# Patient Record
Sex: Female | Born: 1985 | Race: White | Hispanic: No | Smoking: Current every day smoker
Health system: Southern US, Community
[De-identification: ages and names within clinical notes are randomized; demographics above are authoritative.]

## PROBLEM LIST (undated history)

## (undated) ENCOUNTER — Inpatient Hospital Stay (HOSPITAL_COMMUNITY): Payer: Self-pay

## (undated) DIAGNOSIS — O139 Gestational [pregnancy-induced] hypertension without significant proteinuria, unspecified trimester: Secondary | ICD-10-CM

## (undated) DIAGNOSIS — N390 Urinary tract infection, site not specified: Secondary | ICD-10-CM

## (undated) DIAGNOSIS — F419 Anxiety disorder, unspecified: Secondary | ICD-10-CM

## (undated) DIAGNOSIS — Z8759 Personal history of other complications of pregnancy, childbirth and the puerperium: Secondary | ICD-10-CM

## (undated) HISTORY — PX: NOSE SURGERY: SHX723

## (undated) HISTORY — DX: Personal history of other complications of pregnancy, childbirth and the puerperium: Z87.59

## (undated) HISTORY — PX: WISDOM TOOTH EXTRACTION: SHX21

## (undated) HISTORY — PX: WRIST SURGERY: SHX841

## (undated) HISTORY — PX: PALATAL EXPANSION: SHX2147

## (undated) HISTORY — DX: Gestational (pregnancy-induced) hypertension without significant proteinuria, unspecified trimester: O13.9

## (undated) HISTORY — PX: KNEE ARTHROSCOPY: SUR90

---

## 2007-03-08 ENCOUNTER — Emergency Department (HOSPITAL_COMMUNITY): Admission: EM | Admit: 2007-03-08 | Discharge: 2007-03-09 | Payer: Self-pay | Admitting: Emergency Medicine

## 2008-07-16 ENCOUNTER — Emergency Department (HOSPITAL_BASED_OUTPATIENT_CLINIC_OR_DEPARTMENT_OTHER): Admission: EM | Admit: 2008-07-16 | Discharge: 2008-07-17 | Payer: Self-pay | Admitting: Emergency Medicine

## 2008-08-27 ENCOUNTER — Emergency Department (HOSPITAL_BASED_OUTPATIENT_CLINIC_OR_DEPARTMENT_OTHER): Admission: EM | Admit: 2008-08-27 | Discharge: 2008-08-27 | Payer: Self-pay | Admitting: Emergency Medicine

## 2009-01-08 ENCOUNTER — Emergency Department (HOSPITAL_BASED_OUTPATIENT_CLINIC_OR_DEPARTMENT_OTHER): Admission: EM | Admit: 2009-01-08 | Discharge: 2009-01-08 | Payer: Self-pay | Admitting: Emergency Medicine

## 2009-04-13 ENCOUNTER — Ambulatory Visit: Payer: Self-pay | Admitting: Diagnostic Radiology

## 2009-04-13 ENCOUNTER — Emergency Department (HOSPITAL_BASED_OUTPATIENT_CLINIC_OR_DEPARTMENT_OTHER): Admission: EM | Admit: 2009-04-13 | Discharge: 2009-04-13 | Payer: Self-pay | Admitting: Emergency Medicine

## 2009-10-07 ENCOUNTER — Emergency Department (HOSPITAL_BASED_OUTPATIENT_CLINIC_OR_DEPARTMENT_OTHER): Admission: EM | Admit: 2009-10-07 | Discharge: 2009-10-07 | Payer: Self-pay | Admitting: Emergency Medicine

## 2009-10-07 ENCOUNTER — Ambulatory Visit: Payer: Self-pay | Admitting: Diagnostic Radiology

## 2009-11-12 ENCOUNTER — Emergency Department (HOSPITAL_BASED_OUTPATIENT_CLINIC_OR_DEPARTMENT_OTHER): Admission: EM | Admit: 2009-11-12 | Discharge: 2009-11-12 | Payer: Self-pay | Admitting: Emergency Medicine

## 2010-03-19 ENCOUNTER — Emergency Department (HOSPITAL_BASED_OUTPATIENT_CLINIC_OR_DEPARTMENT_OTHER)
Admission: EM | Admit: 2010-03-19 | Discharge: 2010-03-19 | Payer: Self-pay | Source: Home / Self Care | Admitting: Emergency Medicine

## 2010-03-21 LAB — BASIC METABOLIC PANEL
BUN: 14 mg/dL (ref 6–23)
CO2: 25 mEq/L (ref 19–32)
Calcium: 9.9 mg/dL (ref 8.4–10.5)
Chloride: 105 mEq/L (ref 96–112)
Creatinine, Ser: 0.7 mg/dL (ref 0.4–1.2)
GFR calc Af Amer: >60 mL/min (ref >60)
GFR calc non Af Amer: >60 mL/min (ref >60)
Glucose, Bld: 89 mg/dL (ref 70–99)
Potassium: 4.1 mEq/L (ref 3.5–5.1)
Sodium: 144 mEq/L (ref 135–145)

## 2010-03-21 LAB — DIFFERENTIAL
Basophils Absolute: 0 10*3/uL (ref 0.0–0.1)
Basophils Relative: 0 % (ref 0–1)
Eosinophils Absolute: 0.3 10*3/uL (ref 0.0–0.7)
Eosinophils Relative: 3 % (ref 0–5)
Lymphocytes Relative: 29 % (ref 12–46)
Lymphs Abs: 3.3 10*3/uL (ref 0.7–4.0)
Monocytes Absolute: 0.7 10*3/uL (ref 0.1–1.0)
Monocytes Relative: 6 % (ref 3–12)
Neutro Abs: 6.9 10*3/uL (ref 1.7–7.7)
Neutrophils Relative %: 62 % (ref 43–77)

## 2010-03-21 LAB — URINALYSIS, ROUTINE W REFLEX MICROSCOPIC
Bilirubin Urine: NEGATIVE
Hgb urine dipstick: NEGATIVE
Ketones, ur: NEGATIVE mg/dL
Nitrite: NEGATIVE
Protein, ur: NEGATIVE mg/dL
Specific Gravity, Urine: 1.019 (ref 1.005–1.030)
Urine Glucose, Fasting: NEGATIVE mg/dL
Urobilinogen, UA: 1 mg/dL (ref 0.0–1.0)
pH: 6.5 (ref 5.0–8.0)

## 2010-03-21 LAB — PREGNANCY, URINE: Preg Test, Ur: NEGATIVE

## 2010-03-21 LAB — CBC
HCT: 42.6 % (ref 36.0–46.0)
Hemoglobin: 14.8 g/dL (ref 12.0–15.0)
MCH: 32 pg (ref 26.0–34.0)
MCHC: 34.7 g/dL (ref 30.0–36.0)
MCV: 92 fL (ref 78.0–100.0)
Platelets: 210 10*3/uL (ref 150–400)
RBC: 4.63 MIL/uL (ref 3.87–5.11)
RDW: 12.1 % (ref 11.5–15.5)
WBC: 11.2 10*3/uL — ABNORMAL HIGH (ref 4.0–10.5)

## 2010-05-18 LAB — CBC
HCT: 39.4 % (ref 36.0–46.0)
Hemoglobin: 13.8 g/dL (ref 12.0–15.0)
MCH: 33.7 pg (ref 26.0–34.0)
MCHC: 35 g/dL (ref 30.0–36.0)
MCV: 96.5 fL (ref 78.0–100.0)
Platelets: 213 10*3/uL (ref 150–400)
RBC: 4.08 MIL/uL (ref 3.87–5.11)
RDW: 11.7 % (ref 11.5–15.5)
WBC: 8.8 10*3/uL (ref 4.0–10.5)

## 2010-05-18 LAB — URINALYSIS, ROUTINE W REFLEX MICROSCOPIC
Bilirubin Urine: NEGATIVE
Glucose, UA: NEGATIVE mg/dL
Hgb urine dipstick: NEGATIVE
Ketones, ur: NEGATIVE mg/dL
Nitrite: NEGATIVE
Protein, ur: NEGATIVE mg/dL
Specific Gravity, Urine: 1.027 (ref 1.005–1.030)
Urobilinogen, UA: 1 mg/dL (ref 0.0–1.0)
pH: 8 (ref 5.0–8.0)

## 2010-05-18 LAB — COMPREHENSIVE METABOLIC PANEL
ALT: 12 U/L (ref 0–35)
AST: 20 U/L (ref 0–37)
Albumin: 4.3 g/dL (ref 3.5–5.2)
Alkaline Phosphatase: 68 U/L (ref 39–117)
BUN: 9 mg/dL (ref 6–23)
CO2: 26 mEq/L (ref 19–32)
Calcium: 9.2 mg/dL (ref 8.4–10.5)
Chloride: 104 mEq/L (ref 96–112)
Creatinine, Ser: 0.7 mg/dL (ref 0.4–1.2)
GFR calc Af Amer: >60 mL/min (ref >60)
GFR calc non Af Amer: >60 mL/min (ref >60)
Glucose, Bld: 84 mg/dL (ref 70–99)
Potassium: 3.3 mEq/L — ABNORMAL LOW (ref 3.5–5.1)
Sodium: 142 mEq/L (ref 135–145)
Total Bilirubin: 0.7 mg/dL (ref 0.3–1.2)
Total Protein: 7.4 g/dL (ref 6.0–8.3)

## 2010-05-18 LAB — DIFFERENTIAL
Basophils Absolute: 0.1 10*3/uL (ref 0.0–0.1)
Basophils Relative: 1 % (ref 0–1)
Eosinophils Absolute: 0.2 10*3/uL (ref 0.0–0.7)
Eosinophils Relative: 2 % (ref 0–5)
Lymphocytes Relative: 33 % (ref 12–46)
Lymphs Abs: 2.9 10*3/uL (ref 0.7–4.0)
Monocytes Absolute: 0.5 10*3/uL (ref 0.1–1.0)
Monocytes Relative: 5 % (ref 3–12)
Neutro Abs: 5.1 10*3/uL (ref 1.7–7.7)
Neutrophils Relative %: 60 % (ref 43–77)

## 2010-05-18 LAB — PREGNANCY, URINE: Preg Test, Ur: NEGATIVE

## 2010-05-18 LAB — LIPASE, BLOOD: Lipase: 72 U/L (ref 23–300)

## 2010-05-24 ENCOUNTER — Emergency Department (HOSPITAL_BASED_OUTPATIENT_CLINIC_OR_DEPARTMENT_OTHER)
Admission: EM | Admit: 2010-05-24 | Discharge: 2010-05-24 | Disposition: A | Discharge status: Home / Self Care | Attending: Emergency Medicine | Admitting: Emergency Medicine

## 2010-05-24 DIAGNOSIS — B9689 Other specified bacterial agents as the cause of diseases classified elsewhere: Secondary | ICD-10-CM | POA: Insufficient documentation

## 2010-05-24 DIAGNOSIS — F17200 Nicotine dependence, unspecified, uncomplicated: Secondary | ICD-10-CM | POA: Insufficient documentation

## 2010-05-24 DIAGNOSIS — N760 Acute vaginitis: Secondary | ICD-10-CM | POA: Insufficient documentation

## 2010-05-24 DIAGNOSIS — A499 Bacterial infection, unspecified: Secondary | ICD-10-CM | POA: Insufficient documentation

## 2010-05-24 DIAGNOSIS — B373 Candidiasis of vulva and vagina: Secondary | ICD-10-CM | POA: Insufficient documentation

## 2010-05-24 DIAGNOSIS — B3731 Acute candidiasis of vulva and vagina: Secondary | ICD-10-CM | POA: Insufficient documentation

## 2010-05-24 LAB — URINALYSIS, ROUTINE W REFLEX MICROSCOPIC
Bilirubin Urine: NEGATIVE
Glucose, UA: NEGATIVE mg/dL
Hgb urine dipstick: NEGATIVE
Ketones, ur: NEGATIVE mg/dL
Nitrite: NEGATIVE
Protein, ur: NEGATIVE mg/dL
Specific Gravity, Urine: 1.019 (ref 1.005–1.030)
Urobilinogen, UA: 0.2 mg/dL (ref 0.0–1.0)
pH: 6 (ref 5.0–8.0)

## 2010-05-24 LAB — WET PREP, GENITAL: Trich, Wet Prep: NONE SEEN

## 2010-05-24 LAB — PREGNANCY, URINE: Preg Test, Ur: NEGATIVE

## 2010-05-26 LAB — GC/CHLAMYDIA PROBE AMP, GENITAL
Chlamydia, DNA Probe: NEGATIVE
GC Probe Amp, Genital: NEGATIVE

## 2010-06-06 LAB — URINALYSIS, ROUTINE W REFLEX MICROSCOPIC
Bilirubin Urine: NEGATIVE
Glucose, UA: NEGATIVE mg/dL
Hgb urine dipstick: NEGATIVE
Ketones, ur: NEGATIVE mg/dL
Nitrite: NEGATIVE
Protein, ur: NEGATIVE mg/dL
Specific Gravity, Urine: 1.027 (ref 1.005–1.030)
Urobilinogen, UA: 1 mg/dL (ref 0.0–1.0)
pH: 6 (ref 5.0–8.0)

## 2010-06-06 LAB — WET PREP, GENITAL: Yeast Wet Prep HPF POC: NONE SEEN

## 2010-06-06 LAB — GC/CHLAMYDIA PROBE AMP, GENITAL
Chlamydia, DNA Probe: NEGATIVE
GC Probe Amp, Genital: NEGATIVE

## 2010-06-06 LAB — PREGNANCY, URINE: Preg Test, Ur: NEGATIVE

## 2010-06-13 ENCOUNTER — Emergency Department (HOSPITAL_BASED_OUTPATIENT_CLINIC_OR_DEPARTMENT_OTHER)
Admission: EM | Admit: 2010-06-13 | Discharge: 2010-06-14 | Disposition: A | Discharge status: Home / Self Care | Attending: Emergency Medicine | Admitting: Emergency Medicine

## 2010-06-13 DIAGNOSIS — S60229A Contusion of unspecified hand, initial encounter: Secondary | ICD-10-CM | POA: Insufficient documentation

## 2010-06-13 DIAGNOSIS — X58XXXA Exposure to other specified factors, initial encounter: Secondary | ICD-10-CM | POA: Insufficient documentation

## 2010-06-14 ENCOUNTER — Emergency Department (INDEPENDENT_AMBULATORY_CARE_PROVIDER_SITE_OTHER)

## 2010-06-14 DIAGNOSIS — M79609 Pain in unspecified limb: Secondary | ICD-10-CM

## 2010-06-14 DIAGNOSIS — R209 Unspecified disturbances of skin sensation: Secondary | ICD-10-CM

## 2010-06-14 DIAGNOSIS — IMO0002 Reserved for concepts with insufficient information to code with codable children: Secondary | ICD-10-CM

## 2010-07-20 ENCOUNTER — Emergency Department (INDEPENDENT_AMBULATORY_CARE_PROVIDER_SITE_OTHER)

## 2010-07-20 ENCOUNTER — Emergency Department (HOSPITAL_BASED_OUTPATIENT_CLINIC_OR_DEPARTMENT_OTHER)
Admission: EM | Admit: 2010-07-20 | Discharge: 2010-07-20 | Disposition: A | Discharge status: Home / Self Care | Attending: Emergency Medicine | Admitting: Emergency Medicine

## 2010-07-20 DIAGNOSIS — K625 Hemorrhage of anus and rectum: Secondary | ICD-10-CM | POA: Insufficient documentation

## 2010-07-20 DIAGNOSIS — R109 Unspecified abdominal pain: Secondary | ICD-10-CM

## 2010-07-20 DIAGNOSIS — K921 Melena: Secondary | ICD-10-CM

## 2010-07-20 LAB — DIFFERENTIAL
Basophils Relative: 0 % (ref 0–1)
Lymphs Abs: 1.6 10*3/uL (ref 0.7–4.0)
Monocytes Absolute: 0.7 10*3/uL (ref 0.1–1.0)
Monocytes Relative: 6 % (ref 3–12)
Neutro Abs: 9.1 10*3/uL — ABNORMAL HIGH (ref 1.7–7.7)
Neutrophils Relative %: 79 % — ABNORMAL HIGH (ref 43–77)

## 2010-07-20 LAB — URINE MICROSCOPIC-ADD ON

## 2010-07-20 LAB — CBC
Hemoglobin: 12.5 g/dL (ref 12.0–15.0)
MCH: 32.6 pg (ref 26.0–34.0)
MCHC: 34.7 g/dL (ref 30.0–36.0)
MCV: 93.8 fL (ref 78.0–100.0)
RBC: 3.84 MIL/uL — ABNORMAL LOW (ref 3.87–5.11)

## 2010-07-20 LAB — URINALYSIS, ROUTINE W REFLEX MICROSCOPIC
Glucose, UA: NEGATIVE mg/dL
Ketones, ur: 15 mg/dL — AB
Nitrite: NEGATIVE
Protein, ur: NEGATIVE mg/dL
pH: 5.5 (ref 5.0–8.0)

## 2010-10-16 ENCOUNTER — Encounter: Payer: Self-pay | Admitting: *Deleted

## 2010-10-16 ENCOUNTER — Emergency Department (HOSPITAL_BASED_OUTPATIENT_CLINIC_OR_DEPARTMENT_OTHER)
Admission: EM | Admit: 2010-10-16 | Discharge: 2010-10-16 | Disposition: A | Discharge status: Home / Self Care | Attending: Emergency Medicine | Admitting: Emergency Medicine

## 2010-10-16 DIAGNOSIS — S71009A Unspecified open wound, unspecified hip, initial encounter: Secondary | ICD-10-CM | POA: Insufficient documentation

## 2010-10-16 DIAGNOSIS — W540XXA Bitten by dog, initial encounter: Secondary | ICD-10-CM | POA: Insufficient documentation

## 2010-10-16 DIAGNOSIS — S71109A Unspecified open wound, unspecified thigh, initial encounter: Secondary | ICD-10-CM | POA: Insufficient documentation

## 2010-10-16 DIAGNOSIS — Y92009 Unspecified place in unspecified non-institutional (private) residence as the place of occurrence of the external cause: Secondary | ICD-10-CM | POA: Insufficient documentation

## 2010-10-16 DIAGNOSIS — F17200 Nicotine dependence, unspecified, uncomplicated: Secondary | ICD-10-CM | POA: Insufficient documentation

## 2010-10-16 MED ORDER — OXYCODONE-ACETAMINOPHEN 5-325 MG PO TABS
1.0000 | ORAL_TABLET | Freq: Once | ORAL | Status: AC
  Administered 2010-10-16: 1 via ORAL
  Filled 2010-10-16: qty 1

## 2010-10-16 MED ORDER — OXYCODONE-ACETAMINOPHEN 5-325 MG PO TABS: 2.0000 | ORAL_TABLET | ORAL | Status: AC | PRN

## 2010-10-16 NOTE — ED Notes (Signed)
Pt c/o dog bite to right thigh x 1 hr ago

## 2010-10-16 NOTE — ED Notes (Signed)
rx x 1 for percocet given- wound care discussed and pt verbalized understanding and s/s of infection- pt has a ride

## 2010-10-16 NOTE — ED Provider Notes (Addendum)
History     CSN: [id number] Arrival date & time: 10/16/2010  6:10 PM  Chief Complaint  Patient presents with  . Animal Bite   Patient is a 25 y.o. female presenting with animal bite. The history is provided by the patient.  Animal Bite  The incident occurred just prior to arrival. There is an injury to the right thigh. Pertinent negatives include no numbness.   the patient was bitten in her right thigh by the neighbors [place] shepherd. She did have jeans on at the time however the skin was broken on her right upper thigh. The patient's tetanus booster is up to date she complains of pain but did not believe there was any foreign body in the soft tissues. The patient's tetanus booster is up to date she has no numbness weakness or tingling.  History reviewed. No pertinent past medical history.  Past Surgical History  Procedure Date  . Joint replacement   . Palatal expansion     History reviewed. No pertinent family history.  History  Substance Use Topics  . Smoking status: Current Everyday Smoker -- 1.0 packs/day  . Smokeless tobacco: Not on file  . Alcohol Use: No    OB History    Grav Para Term Preterm Abortions TAB SAB Ect Mult Living                  Review of Systems  Musculoskeletal: Negative for joint swelling.  Neurological: Negative for numbness.  Hematological: Does not bruise/bleed easily.  All other systems reviewed and are negative.    Physical Exam  BP 127/65  Pulse 86  Temp(Src) 98.3 F (36.8 C) (Oral)  Resp 16  Wt 112 lb (50.803 kg)  SpO2 100%  LMP 09/22/2010  Physical Exam  Constitutional: She appears well-developed and well-nourished.  HENT:  Head: Normocephalic.  Eyes: Pupils are equal, round, and reactive to light.  Cardiovascular: Normal heart sounds.   Pulmonary/Chest: Breath sounds normal.  Abdominal: Soft.  Musculoskeletal: Normal range of motion. She exhibits edema and tenderness.  Neurological: She is alert.  Skin: Skin is warm  and dry.       2 separate bite marks to right upper thigh there is some mild surrounding ecchymoses. No obvious retained foreign body is appreciated mild tenderness and erythema. No active bleeding.    ED Course  Procedures  MDM Pt is seen and examined;  Initial history and physical completed.  Will follow.  nt Wound care is provided by nursing staff with copious irrigation and cleansing with sterile 4 x 4 no foreign body is noted patient is given pain medication and will be instructed to return the ED in 48 hours for a wound check no antibiotics indicated at this point in time.    Parley Pidcock A. Patrica Duel, MD 10/16/10 1824  Lorelle Gibbs. Patrica Duel, MD 10/16/10 [id number]

## 2014-05-08 DIAGNOSIS — M228X1 Other disorders of patella, right knee: Secondary | ICD-10-CM

## 2014-05-08 HISTORY — DX: Other disorders of patella, right knee: M22.8X1

## 2016-02-19 ENCOUNTER — Emergency Department (HOSPITAL_BASED_OUTPATIENT_CLINIC_OR_DEPARTMENT_OTHER)
Admission: EM | Admit: 2016-02-19 | Discharge: 2016-02-19 | Disposition: A | Discharge status: Home / Self Care | Payer: No Typology Code available for payment source | Attending: Emergency Medicine | Admitting: Emergency Medicine

## 2016-02-19 ENCOUNTER — Encounter (HOSPITAL_BASED_OUTPATIENT_CLINIC_OR_DEPARTMENT_OTHER): Payer: Self-pay | Admitting: *Deleted

## 2016-02-19 DIAGNOSIS — S161XXA Strain of muscle, fascia and tendon at neck level, initial encounter: Secondary | ICD-10-CM | POA: Insufficient documentation

## 2016-02-19 DIAGNOSIS — Y9389 Activity, other specified: Secondary | ICD-10-CM | POA: Insufficient documentation

## 2016-02-19 DIAGNOSIS — F17200 Nicotine dependence, unspecified, uncomplicated: Secondary | ICD-10-CM | POA: Insufficient documentation

## 2016-02-19 DIAGNOSIS — Y999 Unspecified external cause status: Secondary | ICD-10-CM | POA: Diagnosis not present

## 2016-02-19 DIAGNOSIS — S199XXA Unspecified injury of neck, initial encounter: Secondary | ICD-10-CM | POA: Diagnosis present

## 2016-02-19 DIAGNOSIS — Y92410 Unspecified street and highway as the place of occurrence of the external cause: Secondary | ICD-10-CM | POA: Diagnosis not present

## 2016-02-19 HISTORY — DX: Anxiety disorder, unspecified: F41.9

## 2016-02-19 MED ORDER — METHOCARBAMOL 500 MG PO TABS
750.0000 mg | ORAL_TABLET | Freq: Once | ORAL | Status: AC
  Administered 2016-02-19: 750 mg via ORAL
  Filled 2016-02-19: qty 2

## 2016-02-19 MED ORDER — NAPROXEN 500 MG PO TABS: 500.0000 mg | ORAL_TABLET | Freq: Two times a day (BID) | ORAL | 0 refills | Status: DC

## 2016-02-19 MED ORDER — IBUPROFEN 400 MG PO TABS
600.0000 mg | ORAL_TABLET | Freq: Once | ORAL | Status: AC
  Administered 2016-02-19: 600 mg via ORAL
  Filled 2016-02-19: qty 1

## 2016-02-19 MED ORDER — METHOCARBAMOL 500 MG PO TABS: 500.0000 mg | ORAL_TABLET | Freq: Two times a day (BID) | ORAL | 0 refills | Status: DC
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
## 2016-02-19 NOTE — Discharge Instructions (Signed)
Take naproxen for pain. Robaxin for muscle spasms. Use ice and heat. Follow-up with primary care doctor as needed.

## 2016-09-17 ENCOUNTER — Emergency Department (HOSPITAL_BASED_OUTPATIENT_CLINIC_OR_DEPARTMENT_OTHER)
Admission: EM | Admit: 2016-09-17 | Discharge: 2016-09-17 | Disposition: A | Discharge status: Home / Self Care | Payer: BLUE CROSS/BLUE SHIELD | Attending: Emergency Medicine | Admitting: Emergency Medicine

## 2016-09-17 ENCOUNTER — Encounter (HOSPITAL_BASED_OUTPATIENT_CLINIC_OR_DEPARTMENT_OTHER): Payer: Self-pay | Admitting: Emergency Medicine

## 2016-09-17 DIAGNOSIS — Z79899 Other long term (current) drug therapy: Secondary | ICD-10-CM | POA: Insufficient documentation

## 2016-09-17 DIAGNOSIS — N760 Acute vaginitis: Secondary | ICD-10-CM | POA: Insufficient documentation

## 2016-09-17 DIAGNOSIS — F17210 Nicotine dependence, cigarettes, uncomplicated: Secondary | ICD-10-CM | POA: Diagnosis not present

## 2016-09-17 DIAGNOSIS — B9689 Other specified bacterial agents as the cause of diseases classified elsewhere: Secondary | ICD-10-CM | POA: Diagnosis not present

## 2016-09-17 DIAGNOSIS — R300 Dysuria: Secondary | ICD-10-CM | POA: Diagnosis present

## 2016-09-17 HISTORY — DX: Urinary tract infection, site not specified: N39.0

## 2016-09-17 LAB — URINALYSIS, MICROSCOPIC (REFLEX)

## 2016-09-17 LAB — URINALYSIS, ROUTINE W REFLEX MICROSCOPIC
GLUCOSE, UA: NEGATIVE mg/dL
HGB URINE DIPSTICK: NEGATIVE
Ketones, ur: 15 mg/dL — AB
Nitrite: POSITIVE — AB
PH: 7.5 (ref 5.0–8.0)
Protein, ur: NEGATIVE mg/dL
SPECIFIC GRAVITY, URINE: 1.021 (ref 1.005–1.030)

## 2016-09-17 LAB — PREGNANCY, URINE: Preg Test, Ur: NEGATIVE

## 2016-09-17 LAB — WET PREP, GENITAL
Sperm: NONE SEEN
TRICH WET PREP: NONE SEEN
YEAST WET PREP: NONE SEEN

## 2016-09-17 MED ORDER — METRONIDAZOLE 0.75 % VA GEL: 1.0000 | Freq: Two times a day (BID) | VAGINAL | 0 refills | Status: AC

## 2016-09-17 MED ORDER — CEPHALEXIN 500 MG PO CAPS: 500.0000 mg | ORAL_CAPSULE | Freq: Two times a day (BID) | ORAL | 0 refills | Status: DC

## 2016-09-17 NOTE — ED Triage Notes (Signed)
Pain to suprapubic area and also to urethra x2 days.  Diagnosed with kidney stone last week.  Took Macrobid and Pyridium. The pain is worse now. Pt also sts she may have shaved too close and has some bumps to her perineal area.

## 2016-09-17 NOTE — ED Notes (Signed)
Pt. Was recently diagnosed with a kidney stone.

## 2016-09-17 NOTE — Discharge Instructions (Signed)
It is important that you keep your appointment with your urologist next week.  Use antibiotics as prescribed. Use MetroGel twice a day for 5 days. Continue to use Pyridium. Continue to stay well-hydrated, this is very important. Avoid irritating agent such as caffeine, smoking, or chocolate. Return to the emergency department if you develop fever, chills, vomiting, or any new or worsening symptoms.

## 2016-09-17 NOTE — ED Provider Notes (Signed)
[facility]   CSN: [id number] Arrival date & time: 09/17/16  1610  By signing my name below, I, Katherine Barber, attest that this documentation has been prepared under the direction and in the presence of Katherine Joubert, PA-C. Electronically Signed: Linna Barber, Scribe. 09/17/2016. 5:11 PM.  History   Chief Complaint Chief Complaint  Patient presents with  . Dysuria   The history is provided by the patient. No language interpreter was used.    HPI Comments: Katherine Barber is a 31 y.o. female with a PMHx of recurrent UTI's who presents to the Emergency Department complaining of persistent, worsening dysuria for three days. She reports some associated suprapubic pain that is worse when she urinates. She has recently noticed blood on the toilet tissue upon wiping after urination but denies seeing any blood in the toilet bowl. Patient states it feels like "fiery hot razor blades" in her urethra when she urinates. She also notes that it feels like something is stuck in her urethra. Patient was diagnosed with a 3 mm non-obstructing stone in the right kidney on 08/30/16. She was subsequently prescribed Pyridium and Macrobid and completed them as instructed. She has not seen the stone pass and has no prior h/o kidney stones. She denies flank pain. She is sexually active without protection and recently started having intercourse with a new partner. She denies fevers, chills, nausea, vomiting, pain in her upper abdomen, urinary frequency/urgency, or any other associated symptoms. Patient has an appointment with urology on 7/23.  Past Medical History:  Diagnosis Date  . Anxiety   . UTI (urinary tract infection)     There are no active problems to display for this patient.   Past Surgical History:  Procedure Laterality Date  . KNEE ARTHROSCOPY    . NOSE SURGERY    . PALATAL EXPANSION    . WRIST SURGERY      OB History    No data available       Home  Medications    Prior to Admission medications   Medication Sig Start Date End Date Taking? Authorizing Provider  escitalopram (LEXAPRO) 10 MG tablet Take 10 mg by mouth daily.   Yes [provider]  cephALEXin (KEFLEX) 500 MG capsule Take 1 capsule (500 mg total) by mouth 2 (two) times daily. 09/17/16   Rumaysa Sabatino, PA-C  Cyanocobalamin (B-12) 500 MCG SUBL Place 1,500 mcg under the tongue daily.      [provider]  methocarbamol (ROBAXIN) 500 MG tablet Take 1 tablet (500 mg total) by mouth 2 (two) times daily. 02/19/16   Kirichenko, Tatyana, PA-C  metroNIDAZOLE (METROGEL VAGINAL) 0.75 % vaginal gel Place 1 Applicatorful vaginally 2 (two) times daily. 09/17/16 09/22/16  Adler Alton, PA-C  naproxen (NAPROSYN) 500 MG tablet Take 1 tablet (500 mg total) by mouth 2 (two) times daily. 02/19/16   Kirichenko, Lemont Fillers, PA-C  Norgestimate-Ethinyl Estradiol Triphasic (TRI-SPRINTEC) 0.18/0.215/0.25 MG-35 MCG tablet Take 1 tablet by mouth daily.      [provider]    Family History No family history on file.  Social History Social History  Substance Use Topics  . Smoking status: Current Every Day Smoker    Packs/day: 1.00  . Smokeless tobacco: Never Used  . Alcohol use Yes     Comment: socially     Allergies   Patient has no known allergies.   Review of Systems Review of Systems  Constitutional: Negative for chills and fever.  Gastrointestinal: Positive for abdominal  pain. Negative for nausea and vomiting.  Genitourinary: Positive for dysuria. Negative for frequency and urgency.  All other systems reviewed and are negative.  Physical Exam Updated Vital Signs BP 123/87 (BP Location: Right Arm)   Pulse 98   Temp 98.7 F (37.1 C) (Oral)   Resp 16   Ht 5\' 3"  (1.6 m)   Wt 56.2 kg (124 lb)   LMP 09/14/2016 (Exact Date)   SpO2 97%   BMI 21.97 kg/m   Physical Exam  Constitutional: She is oriented to person, place, and time. She appears  well-developed and well-nourished. No distress.  HENT:  Head: Normocephalic and atraumatic.  Eyes: Conjunctivae and EOM are normal.  Neck: Neck supple. No tracheal deviation present.  Cardiovascular: Normal rate, regular rhythm and intact distal pulses.   Pulmonary/Chest: Effort normal and breath sounds normal. No respiratory distress.  Abdominal: Soft. Bowel sounds are normal. She exhibits no distension. There is no tenderness. There is no guarding.  Genitourinary: Rectum normal and uterus normal.    Pelvic exam was performed with patient supine. There is lesion on the right labia. Cervix exhibits no motion tenderness, no discharge and no friability. Right adnexum displays no mass, no tenderness and no fullness. Left adnexum displays no mass, no tenderness and no fullness. There is tenderness in the vagina.  Genitourinary Comments: Two discrete lesions on the right outer labia. Lesions appear to be previously drained pustules without active drainage or surrounding signs of cellulitis. Each lesion is 2 mm in diameter. Pt with tenderness and irritation of the posterior vaginal opening. No obvious bleeding or injury.  No obvious redness or irritation at the urethral opening. Patient with blood from the cervix consistent with menstruation.  Musculoskeletal: Normal range of motion.  Neurological: She is alert and oriented to person, place, and time.  Skin: Skin is warm and dry.  Psychiatric: She has a normal mood and affect. Her behavior is normal.  Nursing note and vitals reviewed.  ED Treatments / Results  Labs (all labs ordered are listed, but only abnormal results are displayed) Labs Reviewed  WET PREP, GENITAL - Abnormal; Notable for the following:       Result Value   Clue Cells Wet Prep HPF POC PRESENT (*)    WBC, Wet Prep HPF POC FEW (*)    All other components within normal limits  URINALYSIS, ROUTINE W REFLEX MICROSCOPIC - Abnormal; Notable for the following:    Color, Urine  ORANGE (*)    Bilirubin Urine SMALL (*)    Ketones, ur 15 (*)    Nitrite POSITIVE (*)    Leukocytes, UA SMALL (*)    All other components within normal limits  URINALYSIS, MICROSCOPIC (REFLEX) - Abnormal; Notable for the following:    Bacteria, UA FEW (*)    Squamous Epithelial / LPF 0-5 (*)    All other components within normal limits  URINE CULTURE  PREGNANCY, URINE  HIV ANTIBODY (ROUTINE TESTING)  RPR  GC/CHLAMYDIA PROBE AMP ([place]) NOT AT [facility]    EKG  EKG Interpretation None       Radiology No results found.  Procedures Procedures (including critical care time)  DIAGNOSTIC STUDIES: Oxygen Saturation is 97% on RA, normal by my interpretation.    COORDINATION OF CARE: 4:56 PM Discussed treatment plan with pt at bedside and pt agreed to plan.  Medications Ordered in ED Medications - No data to display   Initial Impression / Assessment and Plan / ED Course  I have  reviewed the triage vital signs and the nursing notes.  Pertinent labs & imaging results that were available during my care of the patient were reviewed by me and considered in my medical decision making (see chart for details).     Patient presenting with worsening dysuria and suprapubic pain. She denies fever, chills, nausea, vomiting, or flank pain. Physical exam showed no tenderness to palpation of the abdomen. GU exam reassuring. Patient with some vaginal irritation, which she states might be due to tampon use. Wet prep showed clue cells, and UA with positive nitrates and leukocytes. No flank pain or colicky pain indicating that this may be a kidney stone. Will order UA, wet prep, and testing for STDs. Will treat patient for UTI and BV. Discussed case with attending, and Dr. Criss AlvineGoldston evaluated the patient. Discussed findings with patient. Patient to follow-up with urologist as scheduled. Return precautions given. Patient states she understands and agrees to plan.  Final Clinical Impressions(s) /  ED Diagnoses   Final diagnoses:  Dysuria  BV (bacterial vaginosis)    New Prescriptions Discharge Medication List as of 09/17/2016  6:14 PM    START taking these medications   Details  cephALEXin (KEFLEX) 500 MG capsule Take 1 capsule (500 mg total) by mouth 2 (two) times daily., Starting Tue 09/17/2016, Print    metroNIDAZOLE (METROGEL VAGINAL) 0.75 % vaginal gel Place 1 Applicatorful vaginally 2 (two) times daily., Starting Tue 09/17/2016, Until Sun 09/22/2016, Print       I personally performed the services described in this documentation, which was scribed in my presence. The recorded information has been reviewed and is accurate.    Alveria ApleyCaccavale, Masiel Gentzler, PA-C 09/18/16 [id number]    Pricilla LovelessGoldston, Scott, MD 09/18/16 1524

## 2016-09-18 LAB — HIV ANTIBODY (ROUTINE TESTING W REFLEX): HIV SCREEN 4TH GENERATION: NONREACTIVE

## 2016-09-18 LAB — URINE CULTURE

## 2016-09-18 LAB — GC/CHLAMYDIA PROBE AMP (~~LOC~~) NOT AT ARMC
Chlamydia: NEGATIVE
Neisseria Gonorrhea: NEGATIVE

## 2016-09-18 LAB — RPR: RPR Ser Ql: NONREACTIVE

## 2017-03-05 ENCOUNTER — Emergency Department (HOSPITAL_BASED_OUTPATIENT_CLINIC_OR_DEPARTMENT_OTHER): Payer: Self-pay

## 2017-03-05 ENCOUNTER — Other Ambulatory Visit: Payer: Self-pay

## 2017-03-05 ENCOUNTER — Encounter (HOSPITAL_BASED_OUTPATIENT_CLINIC_OR_DEPARTMENT_OTHER): Payer: Self-pay | Admitting: Emergency Medicine

## 2017-03-05 ENCOUNTER — Emergency Department (HOSPITAL_BASED_OUTPATIENT_CLINIC_OR_DEPARTMENT_OTHER)
Admission: EM | Admit: 2017-03-05 | Discharge: 2017-03-05 | Disposition: A | Discharge status: Home / Self Care | Payer: Self-pay | Attending: Emergency Medicine | Admitting: Emergency Medicine

## 2017-03-05 DIAGNOSIS — B9789 Other viral agents as the cause of diseases classified elsewhere: Secondary | ICD-10-CM | POA: Insufficient documentation

## 2017-03-05 DIAGNOSIS — F17210 Nicotine dependence, cigarettes, uncomplicated: Secondary | ICD-10-CM | POA: Insufficient documentation

## 2017-03-05 DIAGNOSIS — J069 Acute upper respiratory infection, unspecified: Secondary | ICD-10-CM | POA: Insufficient documentation

## 2017-03-05 DIAGNOSIS — Z79899 Other long term (current) drug therapy: Secondary | ICD-10-CM | POA: Insufficient documentation

## 2017-03-05 LAB — URINALYSIS, ROUTINE W REFLEX MICROSCOPIC
Bilirubin Urine: NEGATIVE
GLUCOSE, UA: NEGATIVE mg/dL
Hgb urine dipstick: NEGATIVE
KETONES UR: 15 mg/dL — AB
LEUKOCYTES UA: NEGATIVE
NITRITE: NEGATIVE
PH: 6.5 (ref 5.0–8.0)
Protein, ur: NEGATIVE mg/dL
SPECIFIC GRAVITY, URINE: 1.025 (ref 1.005–1.030)

## 2017-03-05 MED ORDER — BENZONATATE 100 MG PO CAPS: 100.0000 mg | ORAL_CAPSULE | Freq: Three times a day (TID) | ORAL | 0 refills | Status: DC | PRN

## 2017-03-05 MED FILL — BENZONATATE 100 MG CAPSULE: 100 | 7 days supply | Qty: 21 | Fill #0

## 2017-03-05 NOTE — ED Triage Notes (Signed)
Patient states that she has had a cough and cold like symptoms x 2 -3 days _ the patient reports that she has been around people with the flu

## 2017-03-05 NOTE — ED Notes (Signed)
Pt endorses fever that started on Monday TMax of 101. Bodyaches, chills, nasal congestion, persistent cough that pt states makes her gag. Pt also endorses mild periods of ShOB.

## 2017-03-05 NOTE — Discharge Instructions (Signed)
Please read instructions below. You can continue taking advil as needed for sore throat or body aches. Drink plenty of water. Take the tessalon every 8 hours as needed for cough. Follow up with your primary care provider as needed. Return to the ER for difficulty swallowing liquids, difficulty breathing, or new or worsening symptoms.

## 2017-03-05 NOTE — ED Provider Notes (Signed)
[facility] EMERGENCY DEPARTMENT Provider Note   CSN: [id number] Arrival date & time: 03/05/17  1456     History   Chief Complaint Chief Complaint  Patient presents with  . Cough    HPI Katherine Barber is a 32 y.o. female with past medical history of anxiety, presenting to the ED with 3 days of body aches, nasal congestion, and cough.  Patient reports symptoms began Monday morning with body aches, then began having nasal congestion and a dry cough.  Been treating her symptoms with ibuprofen, DayQuil and NyQuil.  She states these medications have been helping her symptoms other than the cough.  Reports 1 day of fever less than 101 F.  Reports positive sick contacts including her stepson and her fianc who were diagnosed with influenza A last week.  She denies headache, difficulty breathing or swallowing, nausea, or any other complaints.  The history is provided by the patient.    Past Medical History:  Diagnosis Date  . Anxiety   . UTI (urinary tract infection)     There are no active problems to display for this patient.   Past Surgical History:  Procedure Laterality Date  . KNEE ARTHROSCOPY    . NOSE SURGERY    . PALATAL EXPANSION    . WRIST SURGERY      OB History    No data available       Home Medications    Prior to Admission medications   Medication Sig Start Date End Date Taking? Authorizing Provider  benzonatate (TESSALON) 100 MG capsule Take 1 capsule (100 mg total) by mouth 3 (three) times daily as needed for cough. 03/05/17   Detrick Dani, [place] N, PA-C  cephALEXin (KEFLEX) 500 MG capsule Take 1 capsule (500 mg total) by mouth 2 (two) times daily. 09/17/16   Caccavale, Sophia, PA-C  Cyanocobalamin (B-12) 500 MCG SUBL Place 1,500 mcg under the tongue daily.      [provider]  escitalopram (LEXAPRO) 10 MG tablet Take 10 mg by mouth daily.    [provider]  methocarbamol (ROBAXIN) 500 MG tablet Take 1 tablet (500 mg total) by  mouth 2 (two) times daily. 02/19/16   Kirichenko, Tatyana, PA-C  naproxen (NAPROSYN) 500 MG tablet Take 1 tablet (500 mg total) by mouth 2 (two) times daily. 02/19/16   Kirichenko, Lemont Fillers, PA-C  Norgestimate-Ethinyl Estradiol Triphasic (TRI-SPRINTEC) 0.18/0.215/0.25 MG-35 MCG tablet Take 1 tablet by mouth daily.      [provider]    Family History History reviewed. No pertinent family history.  Social History Social History   Tobacco Use  . Smoking status: Current Every Day Smoker    Packs/day: 1.00  . Smokeless tobacco: Never Used  Substance Use Topics  . Alcohol use: Yes    Comment: socially  . Drug use: No     Allergies   Patient has no known allergies.   Review of Systems Review of Systems  Constitutional: Positive for fever.  HENT: Positive for congestion and sore throat. Negative for ear pain, trouble swallowing and voice change.   Respiratory: Positive for cough. Negative for shortness of breath.   Gastrointestinal: Negative for abdominal pain and nausea.  All other systems reviewed and are negative.    Physical Exam Updated Vital Signs BP 133/80 (BP Location: Left Arm)   Pulse 92   Temp 98.1 F (36.7 C) (Oral)   Resp 18   Ht 5\' 3"  (1.6 m)   Wt 56.2 kg (124 lb)  LMP 03/04/2017   SpO2 100%   BMI 21.97 kg/m   Physical Exam  Constitutional: She appears well-developed and well-nourished. No distress.  Tolerating secretions.  HENT:  Head: Normocephalic and atraumatic.  Pharynx mildly erythematous, without edema or exudates.  Uvula midline, no trismus.  Eyes: Conjunctivae are normal.  Neck: Normal range of motion. Neck supple.  Cardiovascular: Normal rate, regular rhythm, normal heart sounds and intact distal pulses.  Pulmonary/Chest: Effort normal and breath sounds normal. No stridor. No respiratory distress. She has no wheezes. She has no rales.  Lymphadenopathy:    She has no cervical adenopathy.  Skin: Skin is warm. No rash noted.    Psychiatric: She has a normal mood and affect. Her behavior is normal.  Nursing note and vitals reviewed.    ED Treatments / Results  Labs (all labs ordered are listed, but only abnormal results are displayed) Labs Reviewed  URINALYSIS, ROUTINE W REFLEX MICROSCOPIC    EKG  EKG Interpretation None       Radiology Dg Chest 2 View  Result Date: 03/05/2017 CLINICAL DATA:  32 year old female with shortness breath for 2 days. Smoker. Initial encounter. EXAM: CHEST  2 VIEW COMPARISON:  04/13/2009. FINDINGS: No infiltrate, congestive heart failure or pneumothorax. No plain film evidence of pulmonary malignancy. Heart size within normal limits. No acute bony abnormality. IMPRESSION: No active cardiopulmonary disease. Electronically Signed   By: Lacy DuverneySteven  Olson M.D.   On: 03/05/2017 15:51    Procedures Procedures (including critical care time)  Medications Ordered in ED Medications - No data to display   Initial Impression / Assessment and Plan / ED Course  I have reviewed the triage vital signs and the nursing notes.  Pertinent labs & imaging results that were available during my care of the patient were reviewed by me and considered in my medical decision making (see chart for details).     Patients symptoms are consistent with URI, likely viral etiology, possibly influenza. Tolerating secretions, afebrile, not in distress. Lungs clear. Pt CXR negative for acute infiltrate. Discussed possibility of influenza and discussed that tamiflu would not provide much benefit given onset of symptoms.  Discussed that antibiotics are not indicated for viral infections. Pt will be discharged with symptomatic treatment.  Verbalizes understanding and is agreeable with plan. Pt is hemodynamically stable & in NAD prior to dc.  Discussed results, findings, treatment and follow up. Patient advised of return precautions. Patient verbalized understanding and agreed with plan.  Final Clinical  Impressions(s) / ED Diagnoses   Final diagnoses:  Viral URI with cough    ED Discharge Orders        Ordered    benzonatate (TESSALON) 100 MG capsule  3 times daily PRN     03/05/17 1631       Thoren Hosang, [place] N, PA-C 03/05/17 1641    Little, Ambrose Finlandachel Morgan, MD 03/07/17 1451

## 2017-05-02 ENCOUNTER — Other Ambulatory Visit: Payer: Self-pay

## 2017-05-02 ENCOUNTER — Emergency Department (HOSPITAL_BASED_OUTPATIENT_CLINIC_OR_DEPARTMENT_OTHER)
Admission: EM | Admit: 2017-05-02 | Discharge: 2017-05-02 | Disposition: A | Discharge status: Home / Self Care | Attending: Emergency Medicine | Admitting: Emergency Medicine

## 2017-05-02 ENCOUNTER — Encounter (HOSPITAL_BASED_OUTPATIENT_CLINIC_OR_DEPARTMENT_OTHER): Payer: Self-pay | Admitting: Emergency Medicine

## 2017-05-02 DIAGNOSIS — S0181XA Laceration without foreign body of other part of head, initial encounter: Secondary | ICD-10-CM | POA: Insufficient documentation

## 2017-05-02 DIAGNOSIS — Z79899 Other long term (current) drug therapy: Secondary | ICD-10-CM | POA: Insufficient documentation

## 2017-05-02 DIAGNOSIS — Y929 Unspecified place or not applicable: Secondary | ICD-10-CM | POA: Insufficient documentation

## 2017-05-02 DIAGNOSIS — Y998 Other external cause status: Secondary | ICD-10-CM | POA: Insufficient documentation

## 2017-05-02 DIAGNOSIS — F17210 Nicotine dependence, cigarettes, uncomplicated: Secondary | ICD-10-CM | POA: Insufficient documentation

## 2017-05-02 DIAGNOSIS — Y9389 Activity, other specified: Secondary | ICD-10-CM | POA: Insufficient documentation

## 2017-05-02 MED ORDER — LIDOCAINE-EPINEPHRINE-TETRACAINE (LET) SOLUTION
3.0000 mL | Freq: Once | NASAL | Status: AC
  Administered 2017-05-02: 3 mL via TOPICAL
  Filled 2017-05-02: qty 3

## 2017-05-02 MED ORDER — HYDROCODONE-ACETAMINOPHEN 5-325 MG PO TABS: ORAL_TABLET | ORAL | 0 refills | Status: DC

## 2017-05-02 MED ORDER — BACITRACIN ZINC 500 UNIT/GM EX OINT: 1.0000 "application " | TOPICAL_OINTMENT | Freq: Once | CUTANEOUS | Status: DC

## 2017-05-02 MED ORDER — LIDOCAINE HCL (PF) 1 % IJ SOLN
5.0000 mL | Freq: Once | INTRAMUSCULAR | Status: AC
  Administered 2017-05-02: 5 mL via INTRADERMAL
  Filled 2017-05-02: qty 5

## 2017-05-02 NOTE — Discharge Instructions (Signed)
Keep wound dry and do not remove dressing for 24 hours if possible. After that, wash gently morning and night (every 12 hours) with soap and water. Use a topical antibiotic ointment and cover with a bandaid or gauze.  °  °Do NOT use rubbing alcohol or hydrogen peroxide, do not soak the area °  °Present to your primary care doctor or the urgent care of your choice, or the ED for suture removal in 5-7 days. °  °Every attempt was made to remove foreign body (contaminants) from the wound.  However, there is always a chance that some may remain in the wound. This can  increase your risk of infection. °  °If you see signs of infection (warmth, redness, tenderness, pus, sharp increase in pain, fever, red streaking in the skin) immediately return to the emergency department. °  °After the wound heals fully, apply sunscreen for 6-12 months to minimize scarring.  ° ° °

## 2017-05-02 NOTE — ED Notes (Signed)
Laceration cleaned and I&D kit at bedside.
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
## 2017-05-09 ENCOUNTER — Other Ambulatory Visit: Payer: Self-pay

## 2017-05-09 ENCOUNTER — Emergency Department (HOSPITAL_BASED_OUTPATIENT_CLINIC_OR_DEPARTMENT_OTHER)
Admission: EM | Admit: 2017-05-09 | Discharge: 2017-05-09 | Disposition: A | Discharge status: Home / Self Care | Attending: Emergency Medicine | Admitting: Emergency Medicine

## 2017-05-09 ENCOUNTER — Encounter (HOSPITAL_BASED_OUTPATIENT_CLINIC_OR_DEPARTMENT_OTHER): Payer: Self-pay | Admitting: *Deleted

## 2017-05-09 DIAGNOSIS — Z4802 Encounter for removal of sutures: Secondary | ICD-10-CM | POA: Insufficient documentation

## 2017-05-09 DIAGNOSIS — Z79899 Other long term (current) drug therapy: Secondary | ICD-10-CM | POA: Insufficient documentation

## 2017-05-09 DIAGNOSIS — F17210 Nicotine dependence, cigarettes, uncomplicated: Secondary | ICD-10-CM | POA: Insufficient documentation

## 2017-05-09 NOTE — ED Provider Notes (Signed)
[facility] EMERGENCY DEPARTMENT Provider Note   CSN: [id number] Arrival date & time: 05/09/17  1420     History   Chief Complaint Chief Complaint  Patient presents with  . Suture / Staple Removal    HPI Katherine Barber is a 33 y.o. female who presents the emergency department today for suture removal.  Patient had 3, 7-0 sutures placed on the left temporal area 7 days ago.  She notes that this is been healing well.  There is been no overlying pain, erythema, swelling, or drainage.  She has not required any medications for this.  She denies any fevers at home.  Her tetanus was up-to-date during her last visit.   HPI  Past Medical History:  Diagnosis Date  . Anxiety   . UTI (urinary tract infection)     There are no active problems to display for this patient.   Past Surgical History:  Procedure Laterality Date  . KNEE ARTHROSCOPY    . NOSE SURGERY    . PALATAL EXPANSION    . WRIST SURGERY      OB History    No data available       Home Medications    Prior to Admission medications   Medication Sig Start Date End Date Taking? Authorizing Provider  benzonatate (TESSALON) 100 MG capsule Take 1 capsule (100 mg total) by mouth 3 (three) times daily as needed for cough. 03/05/17   Robinson, [place] N, PA-C  cephALEXin (KEFLEX) 500 MG capsule Take 1 capsule (500 mg total) by mouth 2 (two) times daily. 09/17/16   Caccavale, Sophia, PA-C  Cyanocobalamin (B-12) 500 MCG SUBL Place 1,500 mcg under the tongue daily.      [provider]  escitalopram (LEXAPRO) 10 MG tablet Take 10 mg by mouth daily.    [provider]  HYDROcodone-acetaminophen (NORCO/VICODIN) 5-325 MG tablet Take 1-2 tablets by mouth every 6 hours as needed for pain and/or cough. 05/02/17   Pisciotta, Joni Reining, PA-C  methocarbamol (ROBAXIN) 500 MG tablet Take 1 tablet (500 mg total) by mouth 2 (two) times daily. 02/19/16   Kirichenko, Tatyana, PA-C  naproxen (NAPROSYN) 500 MG tablet Take  1 tablet (500 mg total) by mouth 2 (two) times daily. 02/19/16   Kirichenko, Lemont Fillers, PA-C  Norgestimate-Ethinyl Estradiol Triphasic (TRI-SPRINTEC) 0.18/0.215/0.25 MG-35 MCG tablet Take 1 tablet by mouth daily.      [provider]    Family History No family history on file.  Social History Social History   Tobacco Use  . Smoking status: Current Every Day Smoker    Packs/day: 1.00  . Smokeless tobacco: Never Used  Substance Use Topics  . Alcohol use: Yes    Comment: socially  . Drug use: No     Allergies   Patient has no known allergies.   Review of Systems Review of Systems  All other systems reviewed and are negative.    Physical Exam Updated Vital Signs BP 116/73   Pulse 80   Temp 97.9 F (36.6 C) (Oral)   Ht 5\' 2"  (1.575 m)   Wt 54.4 kg (120 lb)   LMP 04/26/2017 (Approximate)   SpO2 100%   BMI 21.95 kg/m   Physical Exam  Constitutional: She appears well-developed and well-nourished.  HENT:  Head: Normocephalic and atraumatic.  Right Ear: External ear normal.  Left Ear: External ear normal.  Left temporal area with 1cm healed laceration with 3 sutures placed. No overlying erythema, swelling, drainage, surrounding induration or heat.  Eyes: Conjunctivae are normal. Right eye exhibits no discharge. Left eye exhibits no discharge. No scleral icterus.  Pulmonary/Chest: Effort normal. No respiratory distress.  Neurological: She is alert. No sensory deficit.  Skin: Skin is warm and dry. Capillary refill takes less than 2 seconds. No erythema. No pallor.  Psychiatric: She has a normal mood and affect.  Nursing note and vitals reviewed.    ED Treatments / Results  Labs (all labs ordered are listed, but only abnormal results are displayed) Labs Reviewed - No data to display  EKG  EKG Interpretation None       Radiology No results found.  Procedures .Suture Removal Date/Time: 05/09/2017 3:10 PM Performed by: Jacinto HalimMaczis, Makalya Nave M,  PA-C Authorized by: Jacinto HalimMaczis, Vishwa Dais M, PA-C   Consent:    Consent obtained:  Verbal   Consent given by:  Patient   Risks discussed:  Bleeding   Alternatives discussed:  No treatment Location:    Location:  Head/neck   Head/neck location:  Forehead Procedure details:    Wound appearance:  No signs of infection, good wound healing and clean   Number of sutures removed:  3 Post-procedure details:    Post-removal:  No dressing applied   Patient tolerance of procedure:  Tolerated well, no immediate complications   (including critical care time)  Medications Ordered in ED Medications - No data to display   Initial Impression / Assessment and Plan / ED Course  I have reviewed the triage vital signs and the nursing notes.  Pertinent labs & imaging results that were available during my care of the patient were reviewed by me and considered in my medical decision making (see chart for details).     Suture removal   Pt to ER for staple/suture removal and wound check as above. Procedure tolerated well. Vitals normal, no signs of infection. Scar minimization & return precautions given at dc.   Final Clinical Impressions(s) / ED Diagnoses   Final diagnoses:  Visit for suture removal    ED Discharge Orders    None       Princella PellegriniMaczis, Dayami Taitt M, PA-C 05/09/17 1512    Nira Connardama, Pedro Eduardo, MD 05/09/17 2344

## 2017-05-09 NOTE — Discharge Instructions (Signed)
You were seen here today for suture removal. Your exam was reassuring. The sutures were removed here in the department. Please follow attached handout on suture removal aftercare. Please follow up with PCP as needed. If you develop fever, overlying swelling, redness or discharge. You can return to the emergency department.

## 2017-05-09 NOTE — ED Triage Notes (Signed)
Sutures x 3 to her left temporal area. Well healed.

## 2017-08-01 ENCOUNTER — Other Ambulatory Visit: Payer: Self-pay

## 2017-08-01 ENCOUNTER — Emergency Department (HOSPITAL_BASED_OUTPATIENT_CLINIC_OR_DEPARTMENT_OTHER)
Admission: EM | Admit: 2017-08-01 | Discharge: 2017-08-01 | Disposition: A | Discharge status: Home / Self Care | Attending: Emergency Medicine | Admitting: Emergency Medicine

## 2017-08-01 ENCOUNTER — Encounter (HOSPITAL_BASED_OUTPATIENT_CLINIC_OR_DEPARTMENT_OTHER): Payer: Self-pay | Admitting: *Deleted

## 2017-08-01 DIAGNOSIS — Z79899 Other long term (current) drug therapy: Secondary | ICD-10-CM | POA: Insufficient documentation

## 2017-08-01 DIAGNOSIS — R102 Pelvic and perineal pain: Secondary | ICD-10-CM | POA: Insufficient documentation

## 2017-08-01 DIAGNOSIS — F17200 Nicotine dependence, unspecified, uncomplicated: Secondary | ICD-10-CM | POA: Insufficient documentation

## 2017-08-01 DIAGNOSIS — N939 Abnormal uterine and vaginal bleeding, unspecified: Secondary | ICD-10-CM

## 2017-08-01 LAB — HCG, QUANTITATIVE, PREGNANCY: hCG, Beta Chain, Quant, S: 4 m[IU]/mL (ref <5)

## 2017-08-01 LAB — URINALYSIS, ROUTINE W REFLEX MICROSCOPIC
BILIRUBIN URINE: NEGATIVE
GLUCOSE, UA: NEGATIVE mg/dL
Hgb urine dipstick: NEGATIVE
KETONES UR: NEGATIVE mg/dL
LEUKOCYTES UA: NEGATIVE
NITRITE: NEGATIVE
Protein, ur: NEGATIVE mg/dL
Specific Gravity, Urine: 1.025 (ref 1.005–1.030)
pH: 6.5 (ref 5.0–8.0)

## 2017-08-01 LAB — PREGNANCY, URINE: Preg Test, Ur: NEGATIVE

## 2017-08-01 NOTE — ED Triage Notes (Signed)
Pelvic pain. She has had positive home pregnancy test. Vaginal bleeding today.

## 2017-08-01 NOTE — Discharge Instructions (Addendum)
Your pregnancy hormone level is negative today. Motrin/tylenol for cramping. Follow up as needed.

## 2017-08-01 NOTE — ED Provider Notes (Signed)
[facility] EMERGENCY DEPARTMENT Provider Note   CSN: [id number] Arrival date & time: 08/01/17  1902     History   Chief Complaint No chief complaint on file.   HPI Katherine Barber is a 32 y.o. female.  HPI Katherine Barber is a 32 y.o. female presents to emergency department with complaint of pelvic pain and vaginal bleeding.  Patient states that she came off birth control that she has been on for 15 years.  Stopped birth control 3 months ago.  Since then has had 2 menstrual cycles.  She states that she has been "feeling bloated and full" all over the last week, states that she took  10 pregnancy tests at home, and states they were all positive.  States that today she started having vaginal bleeding.  She states bleeding is heavier than her regular menstrual cycle.  She reports cramping.  She states she is concerned that she may be pregnant and having a miscarriage.  Past Medical History:  Diagnosis Date  . Anxiety   . UTI (urinary tract infection)     There are no active problems to display for this patient.   Past Surgical History:  Procedure Laterality Date  . KNEE ARTHROSCOPY    . NOSE SURGERY    . PALATAL EXPANSION    . WRIST SURGERY       OB History   None      Home Medications    Prior to Admission medications   Medication Sig Start Date End Date Taking? Authorizing Provider  benzonatate (TESSALON) 100 MG capsule Take 1 capsule (100 mg total) by mouth 3 (three) times daily as needed for cough. 03/05/17   Robinson, [place] N, PA-C  cephALEXin (KEFLEX) 500 MG capsule Take 1 capsule (500 mg total) by mouth 2 (two) times daily. 09/17/16   Caccavale, Sophia, PA-C  Cyanocobalamin (B-12) 500 MCG SUBL Place 1,500 mcg under the tongue daily.      [provider]  escitalopram (LEXAPRO) 10 MG tablet Take 10 mg by mouth daily.    [provider]  HYDROcodone-acetaminophen (NORCO/VICODIN) 5-325 MG tablet Take 1-2 tablets by mouth every 6 hours as  needed for pain and/or cough. 05/02/17   Pisciotta, Joni Reining, PA-C  methocarbamol (ROBAXIN) 500 MG tablet Take 1 tablet (500 mg total) by mouth 2 (two) times daily. 02/19/16   Cayle Cordoba, PA-C  naproxen (NAPROSYN) 500 MG tablet Take 1 tablet (500 mg total) by mouth 2 (two) times daily. 02/19/16   Eryck Negron, Lemont Fillers, PA-C  Norgestimate-Ethinyl Estradiol Triphasic (TRI-SPRINTEC) 0.18/0.215/0.25 MG-35 MCG tablet Take 1 tablet by mouth daily.      [provider]    Family History No family history on file.  Social History Social History   Tobacco Use  . Smoking status: Current Every Day Smoker    Packs/day: 1.00  . Smokeless tobacco: Never Used  Substance Use Topics  . Alcohol use: Yes    Comment: socially  . Drug use: No     Allergies   Patient has no known allergies.   Review of Systems Review of Systems  Constitutional: Negative for chills and fever.  Respiratory: Negative for cough, chest tightness and shortness of breath.   Cardiovascular: Negative for chest pain, palpitations and leg swelling.  Gastrointestinal: Negative for abdominal pain, diarrhea, nausea and vomiting.  Genitourinary: Positive for pelvic pain and vaginal bleeding. Negative for dysuria, flank pain, vaginal discharge and vaginal pain.  Musculoskeletal: Negative for arthralgias, myalgias, neck pain and  neck stiffness.  Skin: Negative for rash.  Neurological: Negative for dizziness, weakness and headaches.  All other systems reviewed and are negative.    Physical Exam Updated Vital Signs BP 119/62 (BP Location: Left Arm)   Pulse 86   Temp 98.4 F (36.9 C) (Oral)   Resp 18   Ht 5\' 3"  (1.6 m)   Wt 54 kg (119 lb)   LMP 08/01/2017   SpO2 100%   BMI 21.08 kg/m   Physical Exam  Constitutional: She appears well-developed and well-nourished. No distress.  HENT:  Head: Normocephalic.  Eyes: Conjunctivae are normal.  Neck: Neck supple.  Cardiovascular: Normal rate, regular rhythm and  normal heart sounds.  Pulmonary/Chest: Effort normal and breath sounds normal. No respiratory distress. She has no wheezes. She has no rales.  Abdominal: Soft. Bowel sounds are normal. She exhibits no distension. There is no tenderness. There is no rebound.  Musculoskeletal: She exhibits no edema.  Neurological: She is alert.  Skin: Skin is warm and dry.  Psychiatric: She has a normal mood and affect. Her behavior is normal.  Nursing note and vitals reviewed.    ED Treatments / Results  Labs (all labs ordered are listed, but only abnormal results are displayed) Labs Reviewed  URINALYSIS, ROUTINE W REFLEX MICROSCOPIC  PREGNANCY, URINE  HCG, QUANTITATIVE, PREGNANCY    EKG None  Radiology No results found.  Procedures Procedures (including critical care time)  Medications Ordered in ED Medications - No data to display   Initial Impression / Assessment and Plan / ED Course  I have reviewed the triage vital signs and the nursing notes.  Pertinent labs & imaging results that were available during my care of the patient were reviewed by me and considered in my medical decision making (see chart for details).     Pt in ED with vaginal bleeding and pelvic cramping.  She is concerned she may be having a miscarriage, she has had 10 pregnancy test that were "faintly positive" at home.  Here urine pregnancy test is negative.  I will get hCG levels.   Patient's hCG levels are 4.  This is not consistent with an active pregnancy or recent miscarriage, since her bleeding just started today.  She states that her menstrual cycle was exactly 28 days ago, so this is most likely just menstrual bleeding and cramping.  Instructed to take ibuprofen or Tylenol for pain.  I do not think she needs any further evaluation in emergency department.  We will have her follow-up as needed.  Vitals:   08/01/17 1909  BP: 119/62  Pulse: 86  Resp: 18  Temp: 98.4 F (36.9 C)  TempSrc: Oral  SpO2: 100%    Weight: 54 kg (119 lb)  Height: 5\' 3"  (1.6 m)     Final Clinical Impressions(s) / ED Diagnoses   Final diagnoses:  Vaginal bleeding    ED Discharge Orders    None       Jaynie CrumbleKirichenko, Dreyah Montrose, Cordelia Poche-C 08/01/17 2042    Maia PlanLong, Joshua G, MD 08/02/17 1054

## 2017-08-01 NOTE — ED Notes (Signed)
Pt. Reports she had solid cramping today for 3 hours and bleeding from her vagina.  Pt. Reports she has had multiple positive pregnancy test in the last several days.

## 2017-08-28 ENCOUNTER — Emergency Department (HOSPITAL_BASED_OUTPATIENT_CLINIC_OR_DEPARTMENT_OTHER)
Admission: EM | Admit: 2017-08-28 | Discharge: 2017-08-28 | Disposition: A | Discharge status: Home / Self Care | Payer: Medicaid Other | Attending: Emergency Medicine | Admitting: Emergency Medicine

## 2017-08-28 ENCOUNTER — Other Ambulatory Visit: Payer: Self-pay

## 2017-08-28 ENCOUNTER — Encounter (HOSPITAL_BASED_OUTPATIENT_CLINIC_OR_DEPARTMENT_OTHER): Payer: Self-pay | Admitting: *Deleted

## 2017-08-28 DIAGNOSIS — Z79899 Other long term (current) drug therapy: Secondary | ICD-10-CM | POA: Diagnosis not present

## 2017-08-28 DIAGNOSIS — F17210 Nicotine dependence, cigarettes, uncomplicated: Secondary | ICD-10-CM | POA: Diagnosis not present

## 2017-08-28 DIAGNOSIS — Z3A00 Weeks of gestation of pregnancy not specified: Secondary | ICD-10-CM | POA: Insufficient documentation

## 2017-08-28 DIAGNOSIS — M25511 Pain in right shoulder: Secondary | ICD-10-CM | POA: Insufficient documentation

## 2017-08-28 DIAGNOSIS — O9989 Other specified diseases and conditions complicating pregnancy, childbirth and the puerperium: Secondary | ICD-10-CM | POA: Diagnosis not present

## 2017-08-28 DIAGNOSIS — G8929 Other chronic pain: Secondary | ICD-10-CM

## 2017-08-28 LAB — PREGNANCY, URINE: Preg Test, Ur: POSITIVE — AB

## 2017-08-28 MED ORDER — ACETAMINOPHEN 500 MG PO TABS: 1000.0000 mg | ORAL_TABLET | Freq: Four times a day (QID) | ORAL | 0 refills | Status: DC | PRN

## 2017-08-28 MED ORDER — ACETAMINOPHEN 500 MG PO TABS
1000.0000 mg | ORAL_TABLET | Freq: Once | ORAL | Status: AC
Start: 2017-08-28 — End: 2017-08-28
  Administered 2017-08-28: 1000 mg via ORAL
  Filled 2017-08-28: qty 2

## 2017-08-28 NOTE — ED Provider Notes (Signed)
[facility] EMERGENCY DEPARTMENT Provider Note   CSN: [id number] Arrival date & time: 08/28/17  1259     History   Chief Complaint Chief Complaint  Patient presents with  . Shoulder Pain    HPI Katherine Barber is a 32 y.o. female.  32 year old female who presents with right shoulder pain.  Patient states that she has a long-standing history of intermittent pain and problems with her right shoulder.  She previously followed with [facility] and had injections in her shoulder and at one point they were discussing shoulder surgery.  She later lost insurance and was lost to follow-up.  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  She states that last night she could not sleep well due to the pain.  Pain is relieved when she is up and moving around but worse at the end of the day.  She has not taken any pain medications for it because she has had 3+ pregnancy tests at home yesterday and was not sure what was safe to take. She has intermittent numbness down her R 3rd-5th fingers.  The history is provided by the patient.  Shoulder Pain   Associated symptoms include numbness.    Past Medical History:  Diagnosis Date  . Anxiety   . UTI (urinary tract infection)     There are no active problems to display for this patient.   Past Surgical History:  Procedure Laterality Date  . KNEE ARTHROSCOPY    . NOSE SURGERY    . PALATAL EXPANSION    . WRIST SURGERY       OB History   None      Home Medications    Prior to Admission medications   Medication Sig Start Date End Date Taking? Authorizing Provider  acetaminophen (TYLENOL) 500 MG tablet Take 2 tablets (1,000 mg total) by mouth every 6 (six) hours as needed for moderate pain. 08/28/17   Karrisa Didio, Ambrose Finland, MD  benzonatate (TESSALON) 100 MG capsule Take 1 capsule (100 mg total) by mouth 3  (three) times daily as needed for cough. 03/05/17   Robinson, [place] N, PA-C  cephALEXin (KEFLEX) 500 MG capsule Take 1 capsule (500 mg total) by mouth 2 (two) times daily. 09/17/16   Caccavale, Sophia, PA-C  Cyanocobalamin (B-12) 500 MCG SUBL Place 1,500 mcg under the tongue daily.      [provider]  escitalopram (LEXAPRO) 10 MG tablet Take 10 mg by mouth daily.    [provider]  HYDROcodone-acetaminophen (NORCO/VICODIN) 5-325 MG tablet Take 1-2 tablets by mouth every 6 hours as needed for pain and/or cough. 05/02/17   Pisciotta, Joni Reining, PA-C  methocarbamol (ROBAXIN) 500 MG tablet Take 1 tablet (500 mg total) by mouth 2 (two) times daily. 02/19/16   Kirichenko, Tatyana, PA-C  naproxen (NAPROSYN) 500 MG tablet Take 1 tablet (500 mg total) by mouth 2 (two) times daily. 02/19/16   Kirichenko, Lemont Fillers, PA-C  Norgestimate-Ethinyl Estradiol Triphasic (TRI-SPRINTEC) 0.18/0.215/0.25 MG-35 MCG tablet Take 1 tablet by mouth daily.      [provider]    Family History No family history on file.  Social History Social History   Tobacco Use  . Smoking status: Current Every Day Smoker    Packs/day: 1.00  . Smokeless tobacco: Never Used  Substance Use Topics  . Alcohol use: Yes    Comment:  socially  . Drug use: No     Allergies   Patient has no known allergies.   Review of Systems Review of Systems  Musculoskeletal: Positive for arthralgias. Negative for joint swelling.  Skin: Negative for rash.  Neurological: Positive for numbness.     Physical Exam Updated Vital Signs BP 117/74   Pulse 79   Temp 98.5 F (36.9 C) (Oral)   Resp 18   Ht 5\' 3"  (1.6 m)   Wt 54 kg (119 lb)   LMP 08/01/2017   SpO2 99%   BMI 21.08 kg/m   Physical Exam  Constitutional: She is oriented to person, place, and time. She appears well-developed and well-nourished. No distress.  HENT:  Head: Normocephalic and atraumatic.  Eyes: Conjunctivae are normal.  Neck: Neck supple.    Musculoskeletal: Normal range of motion. She exhibits no edema, tenderness or deformity.  Pain with extension and abduction of R shoulder, normal sensation and strength distally  Neurological: She is alert and oriented to person, place, and time.  Skin: Skin is warm and dry.  Psychiatric: She has a normal mood and affect. Judgment normal.  Nursing note and vitals reviewed.    ED Treatments / Results  Labs (all labs ordered are listed, but only abnormal results are displayed) Labs Reviewed  PREGNANCY, URINE - Abnormal; Notable for the following components:      Result Value   Preg Test, Ur POSITIVE (*)    All other components within normal limits    EKG None  Radiology No results found.  Procedures Procedures (including critical care time)  Medications Ordered in ED Medications  acetaminophen (TYLENOL) tablet 1,000 mg (has no administration in time range)     Initial Impression / Assessment and Plan / ED Course  I have reviewed the triage vital signs and the nursing notes.      Given on a city of her problem, I advised that plain films would likely not be very useful especially when considering risk of radiation exposure.  Counseled the patient on over-the-counter medications that are safe during early pregnancy including Tylenol, Benadryl as needed.  Provided with OB/GYN follow-up information to establish care.  Encouraged to continue prenatal vitamins.  Discussed supportive measures.  Final Clinical Impressions(s) / ED Diagnoses   Final diagnoses:  Chronic right shoulder pain    ED Discharge Orders        Ordered    acetaminophen (TYLENOL) 500 MG tablet  Every 6 hours PRN     08/28/17 1323       Alvah Lagrow, Ambrose Finlandachel Morgan, MD 08/28/17 1334

## 2017-08-28 NOTE — ED Triage Notes (Signed)
Pain in her right shoulder. Hx of shoulder pain. States she had a positive pregnancy test at home and does not want to take pain medication.

## 2017-09-22 ENCOUNTER — Encounter (INDEPENDENT_AMBULATORY_CARE_PROVIDER_SITE_OTHER): Payer: Self-pay

## 2017-10-17 ENCOUNTER — Ambulatory Visit (INDEPENDENT_AMBULATORY_CARE_PROVIDER_SITE_OTHER): Payer: Medicaid Other | Admitting: Obstetrics and Gynecology

## 2017-10-17 ENCOUNTER — Encounter: Payer: Self-pay | Admitting: Obstetrics and Gynecology

## 2017-10-17 ENCOUNTER — Other Ambulatory Visit (HOSPITAL_COMMUNITY)
Admission: RE | Admit: 2017-10-17 | Discharge: 2017-10-17 | Disposition: A | Discharge status: Home / Self Care | Payer: Medicaid Other | Source: Ambulatory Visit | Attending: Obstetrics and Gynecology | Admitting: Obstetrics and Gynecology

## 2017-10-17 ENCOUNTER — Encounter: Payer: Self-pay | Admitting: Certified Nurse Midwife

## 2017-10-17 VITALS — BP 123/73 | HR 80 | Wt 118.3 lb

## 2017-10-17 DIAGNOSIS — Z3481 Encounter for supervision of other normal pregnancy, first trimester: Secondary | ICD-10-CM | POA: Diagnosis not present

## 2017-10-17 DIAGNOSIS — Z3490 Encounter for supervision of normal pregnancy, unspecified, unspecified trimester: Secondary | ICD-10-CM | POA: Diagnosis not present

## 2017-10-17 DIAGNOSIS — Z3A11 11 weeks gestation of pregnancy: Secondary | ICD-10-CM | POA: Diagnosis not present

## 2017-10-17 NOTE — Patient Instructions (Signed)
Exercise During Pregnancy For people of all ages, exercise is an important part of being healthy. Exercise improves heart and lung function and helps to maintain strength, flexibility, and a healthy body weight. Exercise also boosts energy levels and elevates mood. For most women, maintaining an exercise routine throughout pregnancy is recommended. It is only on rare occasions and with certain medical conditions or pregnancy complications that women may be asked to limit or avoid exercise during pregnancy. What are some other benefits to exercising during pregnancy? Along with maintaining strength and flexibility, exercising throughout pregnancy can help to:  Keep strength in muscles that are very important during labor and childbirth.  Decrease low back pain during pregnancy.  Decrease the risk of developing gestational diabetes mellitus (GDM).  Improve blood sugar (glucose) control for women who have GDM.  Decrease the risk of developing preeclampsia. This is a serious condition that causes high blood pressure along with other symptoms, such as swelling and headaches.  Decrease the risk of cesarean delivery.  Speed up the recovery after giving birth.  How often should I exercise? Unless your health care provider gives you different instructions, you should try to exercise on most days or all days of the week. In general, try to exercise with moderate intensity for about 150 minutes per week. This can be spread out across several days, such as exercising for 30 minutes per day on 5 days of each week. You can tell that you are exercising at a moderate intensity if you have a higher heart rate and faster breathing, but you are still able to hold a conversation. What types of moderate-intensity exercise are recommended during pregnancy? There are many types of exercise that are safe for you to do during pregnancy. Unless your health care provider gives you different instructions, do a variety of  exercises that safely increase your heart and breathing (cardiopulmonary) rates and help you to build and maintain muscle strength (strength training). You should always be able to talk in full sentences while exercising during pregnancy. Some examples of exercising that is safe to do during pregnancy include:  Brisk walking or hiking.  Swimming.  Water aerobics.  Riding a stationary bike.  Strength training.  Modified yoga or Pilates. Tell your instructor that you are pregnant. Avoid overstretching and avoid lying on your back for long periods of time.  Running or jogging. Only choose this type of exercise if: ? You ran or jogged regularly before your pregnancy. ? You can run or jog and still talk in complete sentences.  What types of exercise should I not do during pregnancy? Depending on your level of fitness and whether you exercised regularly before your pregnancy, you may be advised to limit vigorous-intensity exercise during your pregnancy. You can tell that you are exercising at a vigorous intensity if you are breathing much harder and faster and cannot hold a conversation while exercising. Some examples of exercising that you should avoid during pregnancy include:  Contact sports.  Activities that place you at risk for falling on or being hit in the belly, such as downhill skiing, water skiing, surfing, rock climbing, cycling, gymnastics, and horseback riding.  Scuba diving.  Sky diving.  Yoga or Pilates in a room that is heated to extreme temperatures ("hot yoga" or "hot Pilates").  Jogging or running, unless you ran or jogged regularly before your pregnancy. While jogging or running, you should always be able to talk in full sentences. Do not run or jog so vigorously   that you are unable to have a conversation.  If you are not used to exercising at elevation (more than 6,000 feet above sea level), do not do so during your pregnancy.  When should I avoid exercising  during pregnancy? Certain medical conditions can make it unsafe to exercise during pregnancy, or they may increase your risk of miscarriage or early labor and birth. Some of these conditions include:  Some types of heart disease.  Some types of lung disease.  Placenta previa. This is when the placenta partially or completely covers the opening of the uterus (cervix).  Frequent bleeding from the vagina during your pregnancy.  Incompetent cervix. This is when your cervix does not remain as tightly closed during pregnancy as it should.  Premature labor.  Ruptured membranes. This is when the protective sac (amniotic sac) opens up and amniotic fluid leaks from your vagina.  Severely low blood count (anemia).  Preeclampsia or pregnancy-caused high blood pressure.  Carrying more than one baby (multiple gestation) and having an additional risk of early labor.  Poorly controlled diabetes.  Being severely underweight or severely overweight.  Intrauterine growth restriction. This is when your baby's growth and development during pregnancy are slower than expected.  Other medical conditions. Ask your health care provider if any apply to you.  What else should I know about exercising during pregnancy? You should take these precautions while exercising during pregnancy:  Avoid overheating. ? Wear loose-fitting, breathable clothes. ? Do not exercise in very high temperatures.  Avoid dehydration. Drink enough water before, during, and after exercise to keep your urine clear or pale yellow.  Avoid overstretching. Because of hormone changes during pregnancy, it is easy to overstretch muscles, tendons, and ligaments during pregnancy.  Start slowly and ask your health care provider to recommend types of exercise that are safe for you, if exercising regularly is new for you.  Pregnancy is not a time for exercising to lose weight. When should I seek medical care? You should stop exercising  and call your health care provider if you have any unusual symptoms, such as:  Mild uterine contractions or abdominal cramping.  Dizziness that does not improve with rest.  When should I seek immediate medical care? You should stop exercising and call your local emergency services (911 in the [place] if you have any unusual symptoms, such as:  Sudden, severe pain in your low back or your belly.  Uterine contractions or abdominal cramping that do not improve with rest.  Chest pain.  Bleeding or fluid leaking from your vagina.  Shortness of breath.  This information is not intended to replace advice given to you by your health care provider. Make sure you discuss any questions you have with your health care provider. Document Released: 02/18/2005 Document Revised: 07/19/2015 Document Reviewed: 04/28/2014 Elsevier Interactive Patient Education  2018 [employer]. Second Trimester of Pregnancy The second trimester is from week 13 through week 28, month 4 through 6. This is often the time in pregnancy that you feel your best. Often times, morning sickness has lessened or quit. You may have more energy, and you may get hungry more often. Your unborn baby (fetus) is growing rapidly. At the end of the sixth month, he or she is about 9 inches long and weighs about 1 pounds. You will likely feel the baby move (quickening) between 18 and 20 weeks of pregnancy. Follow these instructions at home:  Avoid all smoking, herbs, and alcohol. Avoid drugs not approved by your doctor.  Do not use any tobacco products, including cigarettes, chewing tobacco, and electronic cigarettes. If you need help quitting, ask your doctor. You may get counseling or other support to help you quit.  Only take medicine as told by your doctor. Some medicines are safe and some are not during pregnancy.  Exercise only as told by your doctor. Stop exercising if you start having cramps.  Eat regular, healthy meals.  Wear a  good support bra if your breasts are tender.  Do not use hot tubs, steam rooms, or saunas.  Wear your seat belt when driving.  Avoid raw meat, uncooked cheese, and liter boxes and soil used by cats.  Take your prenatal vitamins.  Take 1500-2000 milligrams of calcium daily starting at the 20th week of pregnancy until you deliver your baby.  Try taking medicine that helps you poop (stool softener) as needed, and if your doctor approves. Eat more fiber by eating fresh fruit, vegetables, and whole grains. Drink enough fluids to keep your pee (urine) clear or pale yellow.  Take warm water baths (sitz baths) to soothe pain or discomfort caused by hemorrhoids. Use hemorrhoid cream if your doctor approves.  If you have puffy, bulging veins (varicose veins), wear support hose. Raise (elevate) your feet for 15 minutes, 3-4 times a day. Limit salt in your diet.  Avoid heavy lifting, wear low heals, and sit up straight.  Rest with your legs raised if you have leg cramps or low back pain.  Visit your dentist if you have not gone during your pregnancy. Use a soft toothbrush to brush your teeth. Be gentle when you floss.  You can have sex (intercourse) unless your doctor tells you not to.  Go to your doctor visits. Get help if:  You feel dizzy.  You have mild cramps or pressure in your lower belly (abdomen).  You have a nagging pain in your belly area.  You continue to feel sick to your stomach (nauseous), throw up (vomit), or have watery poop (diarrhea).  You have bad smelling fluid coming from your vagina.  You have pain with peeing (urination). Get help right away if:  You have a fever.  You are leaking fluid from your vagina.  You have spotting or bleeding from your vagina.  You have severe belly cramping or pain.  You lose or gain weight rapidly.  You have trouble catching your breath and have chest pain.  You notice sudden or extreme puffiness (swelling) of your face,  hands, ankles, feet, or legs.  You have not felt the baby move in over an hour.  You have severe headaches that do not go away with medicine.  You have vision changes. This information is not intended to replace advice given to you by your health care provider. Make sure you discuss any questions you have with your health care provider. Document Released: 05/15/2009 Document Revised: 07/27/2015 Document Reviewed: 04/21/2012 Elsevier Interactive Patient Education  2017 [employer] First Trimester of Pregnancy The first trimester of pregnancy is from week 1 until the end of week 13 (months 1 through 3). During this time, your baby will begin to develop inside you. At 6-8 weeks, the eyes and face are formed, and the heartbeat can be seen on ultrasound. At the end of 12 weeks, all the baby's organs are formed. Prenatal care is all the medical care you receive before the birth of your baby. Make sure you get good prenatal care and follow all of your doctor's instructions. Follow  these instructions at home: Medicines  Take over-the-counter and prescription medicines only as told by your doctor. Some medicines are safe and some medicines are not safe during pregnancy.  Take a prenatal vitamin that contains at least 600 micrograms (mcg) of folic acid.  If you have trouble pooping (constipation), take medicine that will make your stool soft (stool softener) if your doctor approves. Eating and drinking  Eat regular, healthy meals.  Your doctor will tell you the amount of weight gain that is right for you.  Avoid raw meat and uncooked cheese.  If you feel sick to your stomach (nauseous) or throw up (vomit): ? Eat 4 or 5 small meals a day instead of 3 large meals. ? Try eating a few soda crackers. ? Drink liquids between meals instead of during meals.  To prevent constipation: ? Eat foods that are high in fiber, like fresh fruits and vegetables, whole grains, and beans. ? Drink enough  fluids to keep your pee (urine) clear or pale yellow. Activity  Exercise only as told by your doctor. Stop exercising if you have cramps or pain in your lower belly (abdomen) or low back.  Do not exercise if it is too hot, too humid, or if you are in a place of great height (high altitude).  Try to avoid standing for long periods of time. Move your legs often if you must stand in one place for a long time.  Avoid heavy lifting.  Wear low-heeled shoes. Sit and stand up straight.  You can have sex unless your doctor tells you not to. Relieving pain and discomfort  Wear a good support bra if your breasts are sore.  Take warm water baths (sitz baths) to soothe pain or discomfort caused by hemorrhoids. Use hemorrhoid cream if your doctor says it is okay.  Rest with your legs raised if you have leg cramps or low back pain.  If you have puffy, bulging veins (varicose veins) in your legs: ? Wear support hose or compression stockings as told by your doctor. ? Raise (elevate) your feet for 15 minutes, 3-4 times a day. ? Limit salt in your food. Prenatal care  Schedule your prenatal visits by the twelfth week of pregnancy.  Write down your questions. Take them to your prenatal visits.  Keep all your prenatal visits as told by your doctor. This is important. Safety  Wear your seat belt at all times when driving.  Make a list of emergency phone numbers. The list should include numbers for family, friends, the hospital, and police and fire departments. General instructions  Ask your doctor for a referral to a local prenatal class. Begin classes no later than at the start of month 6 of your pregnancy.  Ask for help if you need counseling or if you need help with nutrition. Your doctor can give you advice or tell you where to go for help.  Do not use hot tubs, steam rooms, or saunas.  Do not douche or use tampons or scented sanitary pads.  Do not cross your legs for long periods of  time.  Avoid all herbs and alcohol. Avoid drugs that are not approved by your doctor.  Do not use any tobacco products, including cigarettes, chewing tobacco, and electronic cigarettes. If you need help quitting, ask your doctor. You may get counseling or other support to help you quit.  Avoid cat litter boxes and soil used by cats. These carry germs that can cause birth defects in the baby  and can cause a loss of your baby (miscarriage) or stillbirth.  Visit your dentist. At home, brush your teeth with a soft toothbrush. Be gentle when you floss. Contact a doctor if:  You are dizzy.  You have mild cramps or pressure in your lower belly.  You have a nagging pain in your belly area.  You continue to feel sick to your stomach, you throw up, or you have watery poop (diarrhea).  You have a bad smelling fluid coming from your vagina.  You have pain when you pee (urinate).  You have increased puffiness (swelling) in your face, hands, legs, or ankles. Get help right away if:  You have a fever.  You are leaking fluid from your vagina.  You have spotting or bleeding from your vagina.  You have very bad belly cramping or pain.  You gain or lose weight rapidly.  You throw up blood. It may look like coffee grounds.  You are around people who have [place] measles, fifth disease, or chickenpox.  You have a very bad headache.  You have shortness of breath.  You have any kind of trauma, such as from a fall or a car accident. Summary  The first trimester of pregnancy is from week 1 until the end of week 13 (months 1 through 3).  To take care of yourself and your unborn baby, you will need to eat healthy meals, take medicines only if your doctor tells you to do so, and do activities that are safe for you and your baby.  Keep all follow-up visits as told by your doctor. This is important as your doctor will have to ensure that your baby is healthy and growing well. This information is  not intended to replace advice given to you by your health care provider. Make sure you discuss any questions you have with your health care provider. Document Released: 08/07/2007 Document Revised: 02/27/2016 Document Reviewed: 02/27/2016 Elsevier Interactive Patient Education  2017 [employer]

## 2017-10-17 NOTE — Progress Notes (Signed)
Subjective:    Katherine Barber is being seen today for her first obstetrical visit.  This is a planned pregnancy. She is at 5519w0d gestation. Her obstetrical history is significant for smoker and h/o SAB. She reports smoking 1 pack of cigarettes/day Relationship with FOB: "Katherine Barber"  significant other, living together. Patient does not intend to breast feed. Pregnancy history fully reviewed.  Patient reports no complaints.  Review of Systems:   Review of Systems  Constitutional: Negative.   HENT: Negative.   Eyes: Negative.   Respiratory: Negative.   Cardiovascular: Negative.   Gastrointestinal: Negative.   Endocrine: Negative.   Genitourinary: Negative.   Musculoskeletal: Negative.   Skin: Negative.   Allergic/Immunologic: Negative.   Neurological: Negative.   Hematological: Negative.   Psychiatric/Behavioral: Negative.     Objective:     BP 123/73   Pulse 80   Wt 118 lb 4.8 oz (53.7 kg)   LMP 08/01/2017   BMI 20.96 kg/m  Physical Exam  Nursing note and vitals reviewed. Constitutional: She is oriented to person, place, and time. She appears well-developed and well-nourished.  HENT:  Head: Normocephalic and atraumatic.  Right Ear: External ear normal.  Left Ear: External ear normal.  Nose: Nose normal.  Mouth/Throat: Oropharynx is clear and moist.  Eyes: Pupils are equal, round, and reactive to light. Conjunctivae and EOM are normal.  Neck: Normal range of motion. Neck supple.  Cardiovascular: Normal rate, regular rhythm, normal heart sounds and intact distal pulses.  Respiratory: Effort normal and breath sounds normal.  GI: Soft. Bowel sounds are normal.  Musculoskeletal: Normal range of motion.  Neurological: She is alert and oriented to person, place, and time. She has normal reflexes.  Skin: Skin is warm and dry.  Psychiatric: She has a normal mood and affect. Her behavior is normal. Judgment and thought content normal.    Maternal Exam:  Abdomen: Patient  reports no abdominal tenderness. Introitus: Vulva is positive for vulval piercings (clitoral). Normal vagina.  Ferning test: not done.  Nitrazine test: not done.  Pelvis: adequate for delivery.   Cervix: Cervix evaluated by sterile speculum exam and digital exam.   Pap & GC/CT done  Fetal Exam Fetal Monitor Review: Mode: ultrasound.   Baseline rate: 165 bpm.     Pt informed that the ultrasound is considered a limited OB ultrasound and is not intended to be a complete ultrasound exam.  Patient also informed that the ultrasound is not being completed with the intent of assessing for fetal or placental anomalies or any pelvic abnormalities.  Explained that the purpose of today's ultrasound is to assess for viability. Active viable fetus seen on U/S, S=D. Patient acknowledges the purpose of the exam and the limitations of the study.       Assessment:    Pregnancy: G2P0010 Patient Active Problem List   Diagnosis Date Noted  . Encounter for supervision of normal pregnancy, unspecified, unspecified trimester 10/17/2017       Plan:     Initial labs drawn. Prenatal vitamins. Problem list reviewed and updated. AFP3 discussed: undecided. Role of ultrasound in pregnancy discussed; fetal survey: requested. Amniocentesis discussed: not indicated. The nature [facility] with multiple MDs and other Advanced Practice Providers was explained to patient; also emphasized that residents, students are part of our team. Follow up in 4 weeks. 100% of 40 min visit spent on counseling and coordination of care.     Katherine Moraolitta Nura Cahoon, MSN, CNM 10/17/2017

## 2017-10-20 LAB — URINE CULTURE, OB REFLEX

## 2017-10-20 LAB — CULTURE, OB URINE

## 2017-10-21 LAB — CYTOLOGY - PAP
DIAGNOSIS: NEGATIVE
HPV: NOT DETECTED

## 2017-10-21 LAB — CERVICOVAGINAL ANCILLARY ONLY
CHLAMYDIA, DNA PROBE: NEGATIVE
NEISSERIA GONORRHEA: NEGATIVE

## 2017-10-23 ENCOUNTER — Telehealth: Payer: Self-pay

## 2017-10-23 ENCOUNTER — Other Ambulatory Visit: Payer: Self-pay | Admitting: Obstetrics and Gynecology

## 2017-10-23 MED ORDER — CEPHALEXIN 500 MG PO CAPS: 500.0000 mg | ORAL_CAPSULE | Freq: Four times a day (QID) | ORAL | 0 refills | Status: DC

## 2017-10-23 NOTE — Telephone Encounter (Signed)
Pt called and is wondering if she has a UTI based on her urine culture results. Pt is not symptomatic currently, but states that she has had chronic UTI's in the past.

## 2017-10-24 ENCOUNTER — Encounter: Payer: Self-pay | Admitting: Obstetrics and Gynecology

## 2017-10-25 LAB — OBSTETRIC PANEL, INCLUDING HIV
Antibody Screen: NEGATIVE
BASOS: 0 %
Basophils Absolute: 0 10*3/uL (ref 0.0–0.2)
EOS (ABSOLUTE): 0.1 10*3/uL (ref 0.0–0.4)
EOS: 1 %
HEMATOCRIT: 39.9 % (ref 34.0–46.6)
HEMOGLOBIN: 13 g/dL (ref 11.1–15.9)
HEP B S AG: NEGATIVE
HIV Screen 4th Generation wRfx: NONREACTIVE
IMMATURE GRANS (ABS): 0 10*3/uL (ref 0.0–0.1)
IMMATURE GRANULOCYTES: 0 %
LYMPHS ABS: 1.9 10*3/uL (ref 0.7–3.1)
Lymphs: 25 %
MCH: 33.2 pg — AB (ref 26.6–33.0)
MCHC: 32.6 g/dL (ref 31.5–35.7)
MCV: 102 fL — ABNORMAL HIGH (ref 79–97)
MONOS ABS: 0.4 10*3/uL (ref 0.1–0.9)
Monocytes: 5 %
NEUTROS PCT: 69 %
Neutrophils Absolute: 5.3 10*3/uL (ref 1.4–7.0)
Platelets: 229 10*3/uL (ref 150–450)
RBC: 3.91 x10E6/uL (ref 3.77–5.28)
RDW: 14.3 % (ref 12.3–15.4)
RH TYPE: POSITIVE
RPR Ser Ql: NONREACTIVE
Rubella Antibodies, IGG: 2.22 index (ref >0.99)
WBC: 7.7 10*3/uL (ref 3.4–10.8)

## 2017-10-25 LAB — HEMOGLOBINOPATHY EVALUATION
HEMOGLOBIN F QUANTITATION: 0.8 % (ref 0.0–2.0)
HGB A: 96.7 % (ref 96.4–98.8)
HGB C: 0 %
HGB S: 0 %
HGB VARIANT: 0 %
Hemoglobin A2 Quantitation: 2.5 % (ref 1.8–3.2)

## 2017-10-25 LAB — CYSTIC FIBROSIS MUTATION 97: GENE DIS ANAL CARRIER INTERP BLD/T-IMP: NOT DETECTED

## 2017-11-04 ENCOUNTER — Encounter: Payer: Self-pay | Admitting: *Deleted

## 2017-11-13 ENCOUNTER — Encounter: Payer: Self-pay | Admitting: Obstetrics and Gynecology

## 2017-11-13 ENCOUNTER — Ambulatory Visit (INDEPENDENT_AMBULATORY_CARE_PROVIDER_SITE_OTHER): Payer: Medicaid Other | Admitting: Obstetrics and Gynecology

## 2017-11-13 DIAGNOSIS — Z3481 Encounter for supervision of other normal pregnancy, first trimester: Secondary | ICD-10-CM

## 2017-11-13 DIAGNOSIS — Z3490 Encounter for supervision of normal pregnancy, unspecified, unspecified trimester: Secondary | ICD-10-CM

## 2017-11-13 NOTE — Progress Notes (Signed)
   PRENATAL VISIT NOTE  Subjective:  Katherine Barber is a 32 y.o. G2P0010 at 5952w6d being seen today for ongoing prenatal care.  She is currently monitored for the following issues for this low-risk pregnancy and has Encounter for supervision of normal pregnancy, unspecified, unspecified trimester on their problem list.  Patient reports no complaints.  Contractions: Not present. Vag. Bleeding: None.  Movement: Present. Denies leaking of fluid.   The following portions of the patient's history were reviewed and updated as appropriate: allergies, current medications, past family history, past medical history, past social history, past surgical history and problem list. Problem list updated.  Objective:   Vitals:   11/13/17 0903  BP: 119/77  Pulse: 86  Weight: 121 lb 11.2 oz (55.2 kg)    Fetal Status: Fetal Heart Rate (bpm): 151   Movement: Present     General:  Alert, oriented and cooperative. Patient is in no acute distress.  Skin: Skin is warm and dry. No rash noted.   Cardiovascular: Normal heart rate noted  Respiratory: Normal respiratory effort, no problems with respiration noted  Abdomen: Soft, gravid, appropriate for gestational age.  Pain/Pressure: Present     Pelvic: Cervical exam deferred        Extremities: Normal range of motion.  Edema: None  Mental Status: Normal mood and affect. Normal behavior. Normal judgment and thought content.   Assessment and Plan:  Pregnancy: G2P0010 at 1652w6d  1. Encounter for supervision of normal pregnancy, antepartum, unspecified gravidity Patient is doing well without complaints Anatomy ultrasound ordered AFP next visit - US MFM OB COMP + 14 WK; Future  Preterm labor symptoms and general obstetric precautions including but not limited to vaginal bleeding, contractions, leaking of fluid and fetal movement were reviewed in detail with the patient. Please refer to After Visit Summary for other counseling recommendations.  Return in about 4  weeks (around 12/11/2017) for ROB.  No future appointments.  Catalina AntiguaPeggy Earnest Mcgillis, MD

## 2017-11-13 NOTE — Progress Notes (Signed)
Patient reports feeling fetal flutter movements. Pt reports a sharp left pelvic pain that started a few days ago, but has gotten better.

## 2017-12-01 ENCOUNTER — Inpatient Hospital Stay (HOSPITAL_COMMUNITY)
Admission: AD | Admit: 2017-12-01 | Discharge: 2017-12-01 | Disposition: A | Discharge status: Home / Self Care | Payer: Medicaid Other | Source: Ambulatory Visit | Attending: Obstetrics and Gynecology | Admitting: Obstetrics and Gynecology

## 2017-12-01 ENCOUNTER — Encounter (HOSPITAL_COMMUNITY): Payer: Self-pay

## 2017-12-01 DIAGNOSIS — O99332 Smoking (tobacco) complicating pregnancy, second trimester: Secondary | ICD-10-CM | POA: Diagnosis not present

## 2017-12-01 DIAGNOSIS — F17210 Nicotine dependence, cigarettes, uncomplicated: Secondary | ICD-10-CM | POA: Diagnosis not present

## 2017-12-01 DIAGNOSIS — O99891 Other specified diseases and conditions complicating pregnancy: Secondary | ICD-10-CM

## 2017-12-01 DIAGNOSIS — K5900 Constipation, unspecified: Secondary | ICD-10-CM | POA: Diagnosis not present

## 2017-12-01 DIAGNOSIS — O26892 Other specified pregnancy related conditions, second trimester: Secondary | ICD-10-CM | POA: Insufficient documentation

## 2017-12-01 DIAGNOSIS — R102 Pelvic and perineal pain: Secondary | ICD-10-CM | POA: Insufficient documentation

## 2017-12-01 DIAGNOSIS — M549 Dorsalgia, unspecified: Secondary | ICD-10-CM

## 2017-12-01 DIAGNOSIS — Z3A17 17 weeks gestation of pregnancy: Secondary | ICD-10-CM | POA: Insufficient documentation

## 2017-12-01 DIAGNOSIS — O9989 Other specified diseases and conditions complicating pregnancy, childbirth and the puerperium: Secondary | ICD-10-CM

## 2017-12-01 LAB — URINALYSIS, ROUTINE W REFLEX MICROSCOPIC
Bilirubin Urine: NEGATIVE
GLUCOSE, UA: NEGATIVE mg/dL
Hgb urine dipstick: NEGATIVE
KETONES UR: 5 mg/dL — AB
LEUKOCYTES UA: NEGATIVE
NITRITE: NEGATIVE
PROTEIN: NEGATIVE mg/dL
Specific Gravity, Urine: 1.015 (ref 1.005–1.030)
pH: 7 (ref 5.0–8.0)

## 2017-12-01 MED ORDER — CYCLOBENZAPRINE HCL 10 MG PO TABS: 10.0000 mg | ORAL_TABLET | Freq: Two times a day (BID) | ORAL | 0 refills | Status: DC | PRN

## 2017-12-01 MED ORDER — DOCUSATE SODIUM 100 MG PO CAPS: 100.0000 mg | ORAL_CAPSULE | Freq: Two times a day (BID) | ORAL | 0 refills | Status: DC

## 2017-12-01 MED ORDER — POLYETHYLENE GLYCOL 3350 17 G PO PACK: 17.0000 g | PACK | Freq: Every day | ORAL | 0 refills | Status: DC

## 2017-12-01 NOTE — MAU Provider Note (Signed)
History     CSN: [id number]  Arrival date and time: 12/01/17 4098   First Provider Initiated Contact with Patient 12/01/17 1919      Chief Complaint  Patient presents with  . Back Pain   HPI  Katherine Barber is a 32 y.o. G2P0010 at [redacted]w[redacted]d who presents to MAU with chief complaint of pelvic pain in the setting of a fall in the shower approximately three months ago. Pain is 7/10, does not radiate, no aggravating or alleviating factors. Patient has attempted to manage with 500 mg PO Tylenol without success.    Patient endorses sleeping flat on her back on top of a heating pad on high setting. States this is the only was she relieves back pain.   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 States she squats frequently throughout her shift and is typically not able to stop for rest or stretch breaks during her shift.  Patient also c/o intermittent constipation. Endorses occasional small bowel movements.  Denies vaginal bleeding, leaking of fluid, fever, falls, or recent illness.    OB History    Gravida  2   Para      Term      Preterm      AB  1   Living        SAB  1   TAB      Ectopic      Multiple      Live Births              Past Medical History:  Diagnosis Date  . Anxiety   . UTI (urinary tract infection)     Past Surgical History:  Procedure Laterality Date  . KNEE ARTHROSCOPY    . NOSE SURGERY    . PALATAL EXPANSION    . WRIST SURGERY      Family History  Problem Relation Age of Onset  . Congestive Heart Failure Father     Social History   Tobacco Use  . Smoking status: Current Every Day Smoker    Packs/day: 1.00  . Smokeless tobacco: Never Used  Substance Use Topics  . Alcohol use: Yes    Comment: socially- not since pregnancy  . Drug use: No    Allergies: No Known Allergies  No medications prior to admission.    Review of Systems  Constitutional: Negative for chills, fatigue and fever.  Gastrointestinal: Negative for  abdominal pain, nausea and vomiting.  Genitourinary: Negative for difficulty urinating, dyspareunia, dysuria, flank pain, vaginal bleeding, vaginal discharge and vaginal pain.  Musculoskeletal: Positive for back pain.  Neurological: Negative for headaches.  All other systems reviewed and are negative.  Physical Exam   Blood pressure 116/61, pulse 82, temperature 98.4 F (36.9 C), temperature source Oral, resp. rate 16, weight 57 kg, last menstrual period 08/01/2017.  Physical Exam  Constitutional: She is oriented to person, place, and time.  Cardiovascular: Normal rate and intact distal pulses.  Pulmonary/Chest: Effort normal.  Abdominal: Soft. She exhibits no distension. There is no tenderness.  Musculoskeletal: Normal range of motion.  Neurological: She is alert and oriented to person, place, and time.  Skin: Skin is warm and dry.  Entire dorsal surface of torso mottled due to heating pad use  Psychiatric: Mood, memory, affect and judgment normal.  Nursing note and vitals reviewed.  Focal pain at sacroiliac joint. Non-tender, no skin lesion or other sign of injury. Normal ROM, ambulating independently  MAU Course  Procedures  MDM --No  pregnancy-related complaints  Patient Vitals for the past 24 hrs:  BP Temp Temp src Pulse Resp Weight  12/01/17 1912 - 98.4 F (36.9 C) Oral - 16 -  12/01/17 1911 116/61 - - 82 - -  12/01/17 1907 - - - - - 57 kg    Results for orders placed or performed during the hospital encounter of 12/01/17 (from the past 24 hour(s))  Urinalysis, Routine w reflex microscopic     Status: Abnormal   Collection Time: 12/01/17  7:17 PM  Result Value Ref Range   Color, Urine YELLOW YELLOW   APPearance HAZY (A) CLEAR   Specific Gravity, Urine 1.015 1.005 - 1.030   pH 7.0 5.0 - 8.0   Glucose, UA NEGATIVE NEGATIVE mg/dL   Hgb urine dipstick NEGATIVE NEGATIVE   Bilirubin Urine NEGATIVE NEGATIVE   Ketones, ur 5 (A) NEGATIVE mg/dL   Protein, ur NEGATIVE  NEGATIVE mg/dL   Nitrite NEGATIVE NEGATIVE   Leukocytes, UA NEGATIVE NEGATIVE   Meds ordered this encounter  Medications  . polyethylene glycol (MIRALAX) packet    Sig: Take 17 g by mouth daily.    Dispense:  14 each    Refill:  0    Order Specific Question:   Supervising Provider    Answer:   Reva Bores [[id number]]  . docusate sodium (COLACE) 100 MG capsule    Sig: Take 1 capsule (100 mg total) by mouth every 12 (twelve) hours.    Dispense:  60 capsule    Refill:  0    Order Specific Question:   Supervising Provider    Answer:   Reva Bores [[id number]]  . cyclobenzaprine (FLEXERIL) 10 MG tablet    Sig: Take 1 tablet (10 mg total) by mouth 2 (two) times daily as needed for muscle spasms.    Dispense:  20 tablet    Refill:  0    Order Specific Question:   Supervising Provider    Answer:   Reva Bores [[id number]]     Assessment and Plan  --32 y.o. G2P0010 at [redacted]w[redacted]d  --FHT 144 by doppler --Musculoskeletal injury, rx Flexeril as described above --Messaged [name] to coordinate outpatient consult to physical therapy for further evaluation --Constipation, discussed increasing PO hydration, rx as above --Discharge home in stable condition  F/U: New OB 12/11/17 at [facility]  Calvert Cantor, CNM 12/01/2017, 8:16 PM

## 2017-12-01 NOTE — MAU Note (Addendum)
Pt had a fall 3 months ago in the shower and has had back pain since. C/O pain and pressure from 4-7 in her lower back and has been using a heating pad but says it's getting worse. Did not mention it to her OB when she was seen. Her back appears very red from the heating pad.

## 2017-12-01 NOTE — Discharge Instructions (Signed)
Second Trimester of Pregnancy The second trimester is from week 13 through week 28, month 4 through 6. This is often the time in pregnancy that you feel your best. Often times, morning sickness has lessened or quit. You may have more energy, and you may get hungry more often. Your unborn baby (fetus) is growing rapidly. At the end of the sixth month, he or she is about 9 inches long and weighs about 1 pounds. You will likely feel the baby move (quickening) between 18 and 20 weeks of pregnancy.  Research childbirth classes and hospital preregistration at [email or web address]  Follow these instructions at home:  Avoid all smoking, herbs, and alcohol. Avoid drugs not approved by your doctor.  Do not use any tobacco products, including cigarettes, chewing tobacco, and electronic cigarettes. If you need help quitting, ask your doctor. You may get counseling or other support to help you quit.  Only take medicine as told by your doctor. Some medicines are safe and some are not during pregnancy.  Exercise only as told by your doctor. Stop exercising if you start having cramps.  Eat regular, healthy meals.  Wear a good support bra if your breasts are tender.  Do not use hot tubs, steam rooms, or saunas.  Wear your seat belt when driving.  Avoid raw meat, uncooked cheese, and liter boxes and soil used by cats.  Take your prenatal vitamins.  Take 1500-2000 milligrams of calcium daily starting at the 20th week of pregnancy until you deliver your baby.  Try taking medicine that helps you poop (stool softener) as needed, and if your doctor approves. Eat more fiber by eating fresh fruit, vegetables, and whole grains. Drink enough fluids to keep your pee (urine) clear or pale yellow.  Take warm water baths (sitz baths) to soothe pain or discomfort caused by hemorrhoids. Use hemorrhoid cream if your doctor approves.  If you have puffy, bulging veins (varicose veins), wear support hose. Raise  (elevate) your feet for 15 minutes, 3-4 times a day. Limit salt in your diet.  Avoid heavy lifting, wear low heals, and sit up straight.  Rest with your legs raised if you have leg cramps or low back pain.  Visit your dentist if you have not gone during your pregnancy. Use a soft toothbrush to brush your teeth. Be gentle when you floss.  You can have sex (intercourse) unless your doctor tells you not to.  Go to your doctor visits.  Get help if:  You feel dizzy.  You have mild cramps or pressure in your lower belly (abdomen).  You have a nagging pain in your belly area.  You continue to feel sick to your stomach (nauseous), throw up (vomit), or have watery poop (diarrhea).  You have bad smelling fluid coming from your vagina.  You have pain with peeing (urination). Get help right away if:  You have a fever.  You are leaking fluid from your vagina.  You have spotting or bleeding from your vagina.  You have severe belly cramping or pain.  You lose or gain weight rapidly.  You have trouble catching your breath and have chest pain.  You notice sudden or extreme puffiness (swelling) of your face, hands, ankles, feet, or legs.  You have not felt the baby move in over an hour.  You have severe headaches that do not go away with medicine.  You have vision changes. This information is not intended to replace advice given to you by your health care provider. Make   sure you discuss any questions you have with your health care provider. Document Released: 05/15/2009 Document Revised: 07/27/2015 Document Reviewed: 04/21/2012 Elsevier Interactive Patient Education  2017 Elsevier Inc.    

## 2017-12-03 ENCOUNTER — Other Ambulatory Visit: Payer: Self-pay | Admitting: *Deleted

## 2017-12-03 DIAGNOSIS — Z7689 Persons encountering health services in other specified circumstances: Secondary | ICD-10-CM

## 2017-12-03 NOTE — Progress Notes (Signed)
Referral to physical therapy per S. Weinhold.

## 2017-12-10 ENCOUNTER — Other Ambulatory Visit: Payer: Self-pay

## 2017-12-10 ENCOUNTER — Encounter: Payer: Self-pay | Admitting: Physical Therapy

## 2017-12-10 ENCOUNTER — Ambulatory Visit: Payer: Medicaid Other | Attending: Advanced Practice Midwife | Admitting: Physical Therapy

## 2017-12-10 DIAGNOSIS — G8929 Other chronic pain: Secondary | ICD-10-CM | POA: Insufficient documentation

## 2017-12-10 DIAGNOSIS — M6281 Muscle weakness (generalized): Secondary | ICD-10-CM | POA: Diagnosis not present

## 2017-12-10 DIAGNOSIS — M545 Low back pain, unspecified: Secondary | ICD-10-CM

## 2017-12-10 DIAGNOSIS — R262 Difficulty in walking, not elsewhere classified: Secondary | ICD-10-CM | POA: Diagnosis not present

## 2017-12-10 NOTE — Therapy (Signed)
[facility] [address]  [address] [place], [place], [address] Phone: [phone]   Fax:  [phone]  Physical Therapy Evaluation  Patient Details  Name: Katherine Barber MRN: [id number] Date of Birth: 10-06-1985 Referring Provider (PT): Clayton Bibles, CNM   Encounter Date: 12/10/2017  PT End of Session - 12/10/17 1005    Visit Number  1    Number of Visits  4    Date for PT Re-Evaluation  12/31/17    Authorization Type  Medicaid    PT Start Time  0917    PT Stop Time  1000    PT Time Calculation (min)  43 min    Activity Tolerance  Patient tolerated treatment well    Behavior During Therapy  [facility] for tasks assessed/performed       Past Medical History:  Diagnosis Date  . Anxiety   . UTI (urinary tract infection)     Past Surgical History:  Procedure Laterality Date  . KNEE ARTHROSCOPY    . NOSE SURGERY    . PALATAL EXPANSION    . WRIST SURGERY      There were no vitals filed for this visit.   Subjective Assessment - 12/10/17 0918    Subjective  Patient is 18.[redacted] weeks pregnant. Patient reports 3 months ago fell in the shower and landed on "lower back, upper bottom." Had pain immediately and over time it has gotten worse. When she fell she was 5-[redacted] weeks pregnant. Pain primarily in L side of buttock/sacrum and feeling like it needs to pop. Held off on going to ED because she did not think there was much MDs could do d/t her being pregnant. Went to ED last week d/t severe pain. Hurts with walking, sleeping, laying down, getting up from squatting, prolonged sitting in the car. Nothing relieves it. Reports she has been using heating pad and burned her back from using it so often.  Denies radiation and N/T.    Pertinent History  anxiety, knee arthroscopy, palatal expansion, wrist surgery    Limitations  Walking;House hold activities;Sitting;Lifting;Standing    How long can you sit comfortably?  45 min    How long can  you stand comfortably?  unlimited     How long can you walk comfortably?  0 min, however variable    Diagnostic tests  none    Patient Stated Goals  reduce the pain; figure out what it is    Currently in Pain?  Yes    Pain Score  4     Pain Location  Buttocks    Pain Orientation  Left    Pain Descriptors / Indicators  Sharp;Pressure    Pain Type  Chronic pain    Pain Radiating Towards  n    Aggravating Factors   walking, sleeping, laying down, getting up from squatting, prolonged sitting in the car    Pain Relieving Factors  none         [facility] PT Assessment - 12/10/17 0928      Assessment   Medical Diagnosis  Back pain    Referring Provider (PT)  Clayton Bibles, CNM    Onset Date/Surgical Date  09/09/17    Next MD Visit  --   unsure   Prior Therapy  Yes- for wrist      Precautions   Precautions  --   18.[redacted] weeks pregnant     Restrictions   Weight Bearing Restrictions  No  Balance Screen   Has the patient fallen in the past 6 months  Yes    How many times?  1   initial fall causing injury   Has the patient had a decrease in activity level because of a fear of falling?   No    Is the patient reluctant to leave their home because of a fear of falling?   No      Home Public house manager residence    Living Arrangements  Spouse/significant other    Available Help at Discharge  Family    Type of Home  Apartment    Home Access  Level entry    Home Layout  One level      Prior Function   Level of Independence  Independent    Vocation  Part time employment    Vocation Requirements  standing, walking, lifting- server at [employer]    Leisure  none      Cognition   Overall Cognitive Status  Within Functional Limits for tasks assessed      Observation/Other Assessments   Observations  skin over LB severely mottled      Sensation   Light Touch  Appears Intact      Coordination   Gross Motor Movements are Fluid and Coordinated  Yes       Posture/Postural Control   Posture/Postural Control  Postural limitations    Postural Limitations  Rounded Shoulders      ROM / Strength   AROM / PROM / Strength  AROM;Strength      AROM   AROM Assessment Site  Lumbar    Lumbar Flexion  toes    Lumbar Extension  WFL   tight pressure in L buttock   Lumbar - Right Side Bend  proximal tibia   "pulling in L buttock"   Lumbar - Left Side Bend  proximal tibia    Lumbar - Right Rotation  WFL    Lumbar - Left Rotation  [facility]      Strength   Strength Assessment Site  Hip;Knee;Ankle    Right/Left Hip  Right;Left    Right Hip Flexion  4+/5    Right Hip ABduction  4/5    Right Hip ADduction  4/5    Left Hip Flexion  4/5    Left Hip ABduction  4/5    Left Hip ADduction  4/5    Right/Left Knee  Right;Left    Right Knee Flexion  4/5    Right Knee Extension  4+/5    Left Knee Flexion  4/5    Left Knee Extension  4+/5    Right/Left Ankle  Right;Left    Right Ankle Dorsiflexion  4+/5    Right Ankle Plantar Flexion  4+/5    Left Ankle Dorsiflexion  4+/5    Left Ankle Plantar Flexion  4+/5      Flexibility   Soft Tissue Assessment /Muscle Length  yes   B hip flexors WFL   Hamstrings  B WFL    Quadriceps  B WFL      Palpation   SI assessment   L PSIS elevated, L ASIS depressed    Palpation comment  L sacral ala TTP      Ambulation/Gait   Gait Pattern  Step-through pattern;Within Functional Limits                Objective measurements completed on examination: See above findings.  PT Education - 12/10/17 1004    Education Details  prognosis, POC, HEP; advised not to lay supine for long period of time if feeling SOB d/t pregnancy precautions     Person(s) Educated  Patient    Methods  Explanation;Demonstration;Tactile cues;Verbal cues;Handout    Comprehension  Verbalized understanding;Returned demonstration       PT Short Term Goals - 12/10/17 1201      PT SHORT TERM GOAL #1   Title  Patient  to be independent with initial HEP.    Time  1    Period  Weeks    Status  New    Target Date  12/17/17        PT Long Term Goals - 12/10/17 1201      PT LONG TERM GOAL #1   Title  Patient to be independent with advanced HEP.    Time  3    Period  Weeks    Status  New    Target Date  12/31/17      PT LONG TERM GOAL #2   Title  Patient to demonstrate [facility] and pain-free lumbar AROM.    Time  3    Period  Weeks    Status  New    Target Date  12/31/17      PT LONG TERM GOAL #3   Title  Patient to demonstrate >=4+/5 strength in B LEs.    Time  3    Period  Weeks    Status  New    Target Date  12/31/17      PT LONG TERM GOAL #4   Title  Patient to report tolerance of of walking without pain limiting.     Time  3    Period  Weeks    Status  New    Target Date  12/31/17      PT LONG TERM GOAL #5   Title  Patient to report improved sleeping tolerance by 50%.    Time  3    Period  Weeks    Status  New    Target Date  12/31/17             Plan - 12/10/17 1005    Clinical Impression Statement  Patient is a 32y/o F who is 18.[redacted] weeks pregnant presenting to OPPT with c/o L sided sacral pain after a fall in the shower 3 months ago. Pain primarily in L side of buttock/sacrum and feeling like it needs to pop. Worse with walking, sleeping, laying down, getting up from squatting, prolonged sitting in the car. Denies radiation and N/T. Patient today with [facility] but painful lumbar AROM, decreased strength, WFL flexibility, depressed L ASIS and elevated L PSIS. Educated on gentle strengthening and ROM HEP. Patient reported understanding. Would benefit from skilled PT services 1x/week for 3 weeks to address aforementioned impairments.     Clinical Presentation  Stable    Clinical Decision Making  Low    Rehab Potential  Good    Clinical Impairments Affecting Rehab Potential  anxiety, knee arthroscopy, palatal expansion, wrist surgery; current pregnancy    PT Frequency  1x / week     PT Duration  3 weeks    PT Treatment/Interventions  ADLs/Self Care Home Management;Cryotherapy;Moist Heat;DME Instruction;Gait training;Stair training;Functional mobility training;Therapeutic activities;Therapeutic exercise;Manual techniques;Patient/family education;Neuromuscular re-education;Balance training;Passive range of motion;Energy conservation    PT Next Visit Plan  reassess HEP    Consulted and Agree with Plan of Care  Patient  Patient will benefit from skilled therapeutic intervention in order to improve the following deficits and impairments:  Decreased activity tolerance, Decreased strength, Pain, Difficulty walking, Improper body mechanics, Hypermobility, Postural dysfunction  Visit Diagnosis: Chronic left-sided low back pain without sciatica  Muscle weakness (generalized)  Difficulty in walking, not elsewhere classified     Problem List Patient Active Problem List   Diagnosis Date Noted  . Encounter for supervision of normal pregnancy, unspecified, unspecified trimester 10/17/2017    Anette Guarneri, PT, DPT 12/10/17 12:05 PM   [facility] [address]  [address] [place], [place], [address] Phone: [phone]   Fax:  [phone]  Name: Katherine Barber MRN: [id number] Date of Birth: 1985-07-26

## 2017-12-11 ENCOUNTER — Ambulatory Visit (INDEPENDENT_AMBULATORY_CARE_PROVIDER_SITE_OTHER): Payer: Medicaid Other | Admitting: Obstetrics and Gynecology

## 2017-12-11 ENCOUNTER — Encounter: Payer: Self-pay | Admitting: Obstetrics and Gynecology

## 2017-12-11 VITALS — BP 115/70 | HR 87 | Wt 128.0 lb

## 2017-12-11 DIAGNOSIS — Z3480 Encounter for supervision of other normal pregnancy, unspecified trimester: Secondary | ICD-10-CM | POA: Diagnosis not present

## 2017-12-11 DIAGNOSIS — Z3482 Encounter for supervision of other normal pregnancy, second trimester: Secondary | ICD-10-CM

## 2017-12-11 DIAGNOSIS — M545 Low back pain, unspecified: Secondary | ICD-10-CM

## 2017-12-11 DIAGNOSIS — O339 Maternal care for disproportion, unspecified: Secondary | ICD-10-CM | POA: Diagnosis not present

## 2017-12-11 DIAGNOSIS — Z23 Encounter for immunization: Secondary | ICD-10-CM | POA: Diagnosis not present

## 2017-12-11 DIAGNOSIS — G8929 Other chronic pain: Secondary | ICD-10-CM

## 2017-12-11 MED ORDER — MISC. DEVICES MISC: 0 refills | Status: DC

## 2017-12-11 NOTE — Progress Notes (Signed)
   PRENATAL VISIT NOTE  Subjective:  Katherine Barber is a 32 y.o. G2P0010 at [redacted]w[redacted]d being seen today for ongoing prenatal care.  She is currently monitored for the following issues for this low-risk pregnancy and has Encounter for supervision of normal pregnancy, unspecified, unspecified trimester on their problem list.  Patient reports backache.  Contractions: Not present. Vag. Bleeding: None.  Movement: Present. Denies leaking of fluid.   The following portions of the patient's history were reviewed and updated as appropriate: allergies, current medications, past family history, past medical history, past social history, past surgical history and problem list. Problem list updated.  Objective:   Vitals:   12/11/17 0901  BP: 115/70  Pulse: 87  Weight: 128 lb (58.1 kg)    Fetal Status: Fetal Heart Rate (bpm): 150   Movement: Present     General:  Alert, oriented and cooperative. Patient is in no acute distress.  Skin: Skin is warm and dry. No rash noted.   Cardiovascular: Normal heart rate noted  Respiratory: Normal respiratory effort, no problems with respiration noted  Abdomen: Soft, gravid, appropriate for gestational age.  Pain/Pressure: Present     Pelvic: Cervical exam deferred        Extremities: Normal range of motion.  Edema: None  Mental Status: Normal mood and affect. Normal behavior. Normal judgment and thought content.   Assessment and Plan:  Pregnancy: G2P0010 at [redacted]w[redacted]d  1. Supervision of other normal pregnancy, antepartum Flu shot today  2. Chronic bilateral low back pain without sciatica Chronic pain in lower back since fall 3 months ago in shower Continue PT  3. Skin burn Blotchy skin discoloration on back from heating pad, states it has improved since she stopped using heating pad Instructed to not use heating pad any further   Preterm labor symptoms and general obstetric precautions including but not limited to vaginal bleeding, contractions, leaking of  fluid and fetal movement were reviewed in detail with the patient. Please refer to After Visit Summary for other counseling recommendations.  Return in about 4 weeks (around 01/08/2018) for OB visit.  Future Appointments  Date Time Provider Department Center  12/12/2017 10:15 AM [facility] [place] [facility]  12/16/2017  8:45 AM Maryruth Bun, PT [facility]  12/23/2017  8:45 AM Maryruth Bun, PT [facility]  12/30/2017  8:45 AM Maryruth Bun, PT [facility]    Conan Bowens, MD

## 2017-12-11 NOTE — Addendum Note (Signed)
Addended by: Dalphine Handing on: 12/11/2017 11:11 AM   Modules accepted: Orders

## 2017-12-12 ENCOUNTER — Ambulatory Visit (HOSPITAL_COMMUNITY)
Admission: RE | Admit: 2017-12-12 | Discharge: 2017-12-12 | Disposition: A | Discharge status: Home / Self Care | Payer: Medicaid Other | Source: Ambulatory Visit | Attending: Obstetrics and Gynecology | Admitting: Obstetrics and Gynecology

## 2017-12-12 ENCOUNTER — Other Ambulatory Visit (HOSPITAL_COMMUNITY): Payer: Self-pay | Admitting: *Deleted

## 2017-12-12 ENCOUNTER — Other Ambulatory Visit: Payer: Self-pay | Admitting: Obstetrics and Gynecology

## 2017-12-12 DIAGNOSIS — O3503X0 Maternal care for (suspected) central nervous system malformation or damage in fetus, choroid plexus cysts, not applicable or unspecified: Secondary | ICD-10-CM

## 2017-12-12 DIAGNOSIS — O358XX0 Maternal care for other (suspected) fetal abnormality and damage, not applicable or unspecified: Secondary | ICD-10-CM | POA: Diagnosis not present

## 2017-12-12 DIAGNOSIS — O359XX0 Maternal care for (suspected) fetal abnormality and damage, unspecified, not applicable or unspecified: Secondary | ICD-10-CM | POA: Diagnosis not present

## 2017-12-12 DIAGNOSIS — Z3490 Encounter for supervision of normal pregnancy, unspecified, unspecified trimester: Secondary | ICD-10-CM

## 2017-12-12 DIAGNOSIS — O99332 Smoking (tobacco) complicating pregnancy, second trimester: Secondary | ICD-10-CM | POA: Insufficient documentation

## 2017-12-12 DIAGNOSIS — O350XX0 Maternal care for (suspected) central nervous system malformation in fetus, not applicable or unspecified: Secondary | ICD-10-CM

## 2017-12-12 DIAGNOSIS — Z363 Encounter for antenatal screening for malformations: Secondary | ICD-10-CM | POA: Insufficient documentation

## 2017-12-12 DIAGNOSIS — Z3A19 19 weeks gestation of pregnancy: Secondary | ICD-10-CM | POA: Diagnosis not present

## 2017-12-13 LAB — AFP, SERUM, OPEN SPINA BIFIDA
AFP MOM: 1.29
AFP VALUE AFPOSL: 60.5 ng/mL
Gest. Age on Collection Date: 18.9 weeks
MATERNAL AGE AT EDD: 31 a
OSBR RISK 1 IN: 4947
TEST RESULTS AFP: NEGATIVE
Weight: 159 [lb_av]

## 2017-12-16 ENCOUNTER — Ambulatory Visit: Payer: Medicaid Other | Admitting: Physical Therapy

## 2017-12-16 ENCOUNTER — Encounter: Payer: Self-pay | Admitting: Physical Therapy

## 2017-12-16 DIAGNOSIS — R262 Difficulty in walking, not elsewhere classified: Secondary | ICD-10-CM | POA: Diagnosis not present

## 2017-12-16 DIAGNOSIS — M545 Low back pain: Secondary | ICD-10-CM | POA: Diagnosis not present

## 2017-12-16 DIAGNOSIS — M6281 Muscle weakness (generalized): Secondary | ICD-10-CM | POA: Diagnosis not present

## 2017-12-16 DIAGNOSIS — G8929 Other chronic pain: Secondary | ICD-10-CM | POA: Diagnosis not present

## 2017-12-16 NOTE — Therapy (Signed)
[facility] [address]  [address] [place], [place], [address] Phone: [phone]   Fax:  [phone]  Physical Therapy Treatment  Patient Details  Name: Katherine Barber MRN: [id number] Date of Birth: [birth year] Referring Provider (PT): Clayton Bibles, CNM   Encounter Date: 12/16/2017  PT End of Session - 12/16/17 0926    Visit Number  2    Number of Visits  4    Date for PT Re-Evaluation  12/31/17    Authorization Type  Medicaid    Authorization Time Period  12/16/17 - 01/05/18    Authorization - Visit Number  1    Authorization - Number of Visits  3    PT Start Time  0848    PT Stop Time  0929    PT Time Calculation (min)  41 min    Activity Tolerance  Patient tolerated treatment well    Behavior During Therapy  [facility] for tasks assessed/performed       Past Medical History:  Diagnosis Date  . Anxiety   . UTI (urinary tract infection)     Past Surgical History:  Procedure Laterality Date  . KNEE ARTHROSCOPY    . NOSE SURGERY    . PALATAL EXPANSION    . WRIST SURGERY      There were no vitals filed for this visit.  Subjective Assessment - 12/16/17 0849    Subjective  Reports she is tired- had to go to ER with her fiance who had a painful chipped tooth. Reports she has done exercises once a day. Wearing a maternity support brace and that has been helping. Was having a band of pain around back and lower abdomen- told MD about this and MD told her that it is likely from stretching ligaments. Was told she over-did it because she moved a whole house in 5 days.     Pertinent History  anxiety, knee arthroscopy, palatal expansion, wrist surgery    Patient Stated Goals  reduce the pain; figure out what it is    Currently in Pain?  Yes    Pain Score  2     Pain Location  Buttocks    Pain Orientation  Left    Pain Descriptors / Indicators  Sharp;Pressure    Pain Type  Chronic pain                        [facility] Adult PT Treatment/Exercise - 12/16/17 0001      Exercises   Exercises  Lumbar;Knee/Hip      Lumbar Exercises: Seated   Other Seated Lumbar Exercises  TrA contraction 10x10" with cues for palpation    Other Seated Lumbar Exercises  TrA contraction + marching 20x   cues to maintain core contraction     Lumbar Exercises: Supine   Bridge  10 reps;Limitations    Bridge Limitations  2x10; cues to avoid over extension of lumbar spine    Other Supine Lumbar Exercises  windshield wipers to tolerance x20      Lumbar Exercises: Sidelying   Clam  Right;Left;10 reps;Limitations    Clam Limitations  cues to avoid trunk rotation      Lumbar Exercises: Quadruped   Madcat/Old Horse  10 reps    Madcat/Old Horse Limitations  cues for inhale/exhale- most limited in pos pelvic tilt    Other Quadruped Lumbar Exercises  child's pose 5x5" to tolerance   cues for increased posterior pelvic  tilt     Knee/Hip Exercises: Seated   Other Seated Knee/Hip Exercises  sitting on green pball pelvic tilts ant/pos x20   cues to avoid pain            PT Education - 12/16/17 0926    Education Details  update to HEP    Person(s) Educated  Patient    Methods  Explanation;Demonstration;Tactile cues;Verbal cues;Handout    Comprehension  Verbalized understanding;Returned demonstration       PT Short Term Goals - 12/16/17 0936      PT SHORT TERM GOAL #1   Title  Patient to be independent with initial HEP.    Time  1    Period  Weeks    Status  On-going        PT Long Term Goals - 12/16/17 0936      PT LONG TERM GOAL #1   Title  Patient to be independent with advanced HEP.    Time  3    Period  Weeks    Status  On-going      PT LONG TERM GOAL #2   Title  Patient to demonstrate [facility] and pain-free lumbar AROM.    Time  3    Period  Weeks    Status  On-going      PT LONG TERM GOAL #3   Title  Patient to demonstrate >=4+/5 strength in B LEs.    Time  3     Period  Weeks    Status  On-going      PT LONG TERM GOAL #4   Title  Patient to report tolerance of of walking without pain limiting.     Time  3    Period  Weeks    Status  On-going      PT LONG TERM GOAL #5   Title  Patient to report improved sleeping tolerance by 50%.    Time  3    Period  Weeks    Status  On-going            Plan - 12/16/17 0936    Clinical Impression Statement  Patient reporting band of pain in back and abdomen after last session. Saw MD who told her that this pain was likely from stretching the ligaments and over-working since she did a lot of lifting and moving in the days surrounding this time. Worked on TrA contraction and palpation of contraction- patient with good carryover of this exercise. Able to add dynamic movement with TrA contraction today. Patient with difficulty maintaining contraction but good focus and effort. Patient denied increase in pain during or after session today. Patient limited in posterior pelvic tilt with cat/cow, however with excessive motion with anterior pelvic tilt. Updated HEP to include sitting pelvic tilts. No c/o pain at end of session.     Clinical Impairments Affecting Rehab Potential  anxiety, knee arthroscopy, palatal expansion, wrist surgery; current pregnancy    PT Treatment/Interventions  ADLs/Self Care Home Management;Cryotherapy;Moist Heat;DME Instruction;Gait training;Stair training;Functional mobility training;Therapeutic activities;Therapeutic exercise;Manual techniques;Patient/family education;Neuromuscular re-education;Balance training;Passive range of motion;Energy conservation    Consulted and Agree with Plan of Care  Patient       Patient will benefit from skilled therapeutic intervention in order to improve the following deficits and impairments:  Decreased activity tolerance, Decreased strength, Pain, Difficulty walking, Improper body mechanics, Hypermobility, Postural dysfunction  Visit  Diagnosis: Chronic left-sided low back pain without sciatica  Muscle weakness (generalized)  Difficulty in walking, not  elsewhere classified     Problem List Patient Active Problem List   Diagnosis Date Noted  . Encounter for supervision of normal pregnancy, unspecified, unspecified trimester 10/17/2017    Anette Guarneri, PT, DPT 12/16/17 9:37 AM   [facility] [address]  [address] [place], [place], [address] Phone: [phone]   Fax:  [phone]  Name: Katherine Barber MRN: [id number] Date of Birth: July 01, 1985

## 2017-12-23 ENCOUNTER — Ambulatory Visit: Payer: Medicaid Other | Admitting: Physical Therapy

## 2017-12-23 ENCOUNTER — Encounter: Payer: Self-pay | Admitting: Physical Therapy

## 2017-12-23 DIAGNOSIS — R262 Difficulty in walking, not elsewhere classified: Secondary | ICD-10-CM

## 2017-12-23 DIAGNOSIS — G8929 Other chronic pain: Secondary | ICD-10-CM | POA: Diagnosis not present

## 2017-12-23 DIAGNOSIS — M6281 Muscle weakness (generalized): Secondary | ICD-10-CM

## 2017-12-23 DIAGNOSIS — M545 Low back pain, unspecified: Secondary | ICD-10-CM

## 2017-12-23 NOTE — Therapy (Signed)
[facility] [address]  [address] [place], [place], [address] Phone: [phone]   Fax:  [phone]  Physical Therapy Treatment  Patient Details  Name: Katherine Barber MRN: [id number] Date of Birth: Jul 11, 1985 Referring Provider (PT): Clayton Bibles, [place]   Encounter Date: 12/23/2017  PT End of Session - 12/23/17 0928    Visit Number  3    Number of Visits  4    Date for PT Re-Evaluation  12/31/17    Authorization Type  Medicaid    Authorization Time Period  12/16/17 - 01/05/18    Authorization - Visit Number  2    Authorization - Number of Visits  3    PT Start Time  0843    PT Stop Time  0925    PT Time Calculation (min)  42 min    Activity Tolerance  Patient tolerated treatment well    Behavior During Therapy  [facility] for tasks assessed/performed       Past Medical History:  Diagnosis Date  . Anxiety   . UTI (urinary tract infection)     Past Surgical History:  Procedure Laterality Date  . KNEE ARTHROSCOPY    . NOSE SURGERY    . PALATAL EXPANSION    . WRIST SURGERY      There were no vitals filed for this visit.  Subjective Assessment - 12/23/17 0844    Subjective  Reports she is down to 1 flexeril and has been working a lot this weekend. Has not been hurting a lot this week despite working a lot. HEP has been going well.     Pertinent History  anxiety, knee arthroscopy, palatal expansion, wrist surgery    Patient Stated Goals  reduce the pain; figure out what it is    Currently in Pain?  No/denies                       [facility] Adult PT Treatment/Exercise - 12/23/17 0001      Exercises   Exercises  Lumbar;Knee/Hip      Lumbar Exercises: Stretches   Single Knee to Chest Stretch  Right;Left;1 rep;30 seconds;Limitations      Lumbar Exercises: Seated   Long Arc Quad on Chair  Strengthening;Right;Left;1 set;10 reps;Limitations    LAQ on Chair Limitations  sitting on green pball; weakness  on R LE    Hip Flexion on [place];Right;Left;10 reps;Limitations    Hip Flexion on Ball Limitations  instability on L side    Other Seated Lumbar Exercises  TrA contraction 10x10" with cues for palpation    Other Seated Lumbar Exercises  TrA contraction + marching 20x; TrA contraction + alt UE/LE lift x10      Lumbar Exercises: Sidelying   Hip Abduction  Right;Left;10 reps;Limitations    Hip Abduction Limitations  cues to maintain proper alignment      Lumbar Exercises: Quadruped   Other Quadruped Lumbar Exercises  child's pose 5x5" to front, L, R to tolerance      Knee/Hip Exercises: Seated   Other Seated Knee/Hip Exercises  sitting on green pball pelvic tilts ant/pos x10      Knee/Hip Exercises: Supine   Bridges with Newman Pies Squeeze  Strengthening;Both;1 set;10 reps;Limitations    Bridges with Clamshell  Strengthening;Both;1 set;10 reps;Limitations   red band around knees            PT Education - 12/23/17 0927    Education Details  update to HEP; administered red TB    Person(s) Educated  Patient    Methods  Explanation;Demonstration;Tactile cues;Verbal cues;Handout    Comprehension  Verbalized understanding;Returned demonstration       PT Short Term Goals - 12/16/17 0936      PT SHORT TERM GOAL #1   Title  Patient to be independent with initial HEP.    Time  1    Period  Weeks    Status  On-going        PT Long Term Goals - 12/16/17 0936      PT LONG TERM GOAL #1   Title  Patient to be independent with advanced HEP.    Time  3    Period  Weeks    Status  On-going      PT LONG TERM GOAL #2   Title  Patient to demonstrate [facility] and pain-free lumbar AROM.    Time  3    Period  Weeks    Status  On-going      PT LONG TERM GOAL #3   Title  Patient to demonstrate >=4+/5 strength in B LEs.    Time  3    Period  Weeks    Status  On-going      PT LONG TERM GOAL #4   Title  Patient to report tolerance of of walking without pain limiting.     Time  3     Period  Weeks    Status  On-going      PT LONG TERM GOAL #5   Title  Patient to report improved sleeping tolerance by 50%.    Time  3    Period  Weeks    Status  On-going            Plan - 12/23/17 1610    Clinical Impression Statement  Patient arrived to session with report that she worked a lot over the weekend and pain levels have been low despite this. Patient reporting muscle fatigue with TrA marching however with good form and effort throughout. Progressed TrA contraction with alternating UE/LE lift with cues to incorporate rhythmic breathing. Progressed ball exercises with marching- patient with apparent weakness on R LE d/t instability when lifting L LE. Patient reporting less and less muscle fatigue with subsequent exercises, indicating good progress in core strength and stability. Reporting challenge and difficulty when rising from child's pose stretch to R/L. Updated HEP with clamshells and bridge with banded resistance. Patient reported understanding. No complaints at end of session.    Clinical Impairments Affecting Rehab Potential  anxiety, knee arthroscopy, palatal expansion, wrist surgery; current pregnancy    PT Treatment/Interventions  ADLs/Self Care Home Management;Cryotherapy;Moist Heat;DME Instruction;Gait training;Stair training;Functional mobility training;Therapeutic activities;Therapeutic exercise;Manual techniques;Patient/family education;Neuromuscular re-education;Balance training;Passive range of motion;Energy conservation    Consulted and Agree with Plan of Care  Patient       Patient will benefit from skilled therapeutic intervention in order to improve the following deficits and impairments:  Decreased activity tolerance, Decreased strength, Pain, Difficulty walking, Improper body mechanics, Hypermobility, Postural dysfunction  Visit Diagnosis: Chronic left-sided low back pain without sciatica  Muscle weakness (generalized)  Difficulty in walking, not  elsewhere classified     Problem List Patient Active Problem List   Diagnosis Date Noted  . Encounter for supervision of normal pregnancy, unspecified, unspecified trimester 10/17/2017    Anette Guarneri, PT, DPT 12/23/17 9:30 AM   [facility] [place] [address]  [address]  [address] [place], [place], [address] Phone: [phone]   Fax:  [phone]  Name: Katherine Barber MRN: [id number] Date of Birth: 27-Nov-1985

## 2017-12-29 ENCOUNTER — Other Ambulatory Visit: Payer: Self-pay

## 2017-12-29 ENCOUNTER — Telehealth: Payer: Self-pay | Admitting: *Deleted

## 2017-12-29 NOTE — Telephone Encounter (Signed)
Patient called wondering if she can get a refill on her flexeril? States she is going to PT for a back issue and she ran out of the flexeril, states it is the only medication that helps her.

## 2017-12-30 ENCOUNTER — Encounter: Payer: Self-pay | Admitting: Physical Therapy

## 2017-12-30 ENCOUNTER — Ambulatory Visit: Payer: Medicaid Other | Admitting: Physical Therapy

## 2017-12-30 ENCOUNTER — Other Ambulatory Visit: Payer: Self-pay | Admitting: Advanced Practice Midwife

## 2017-12-30 DIAGNOSIS — G8929 Other chronic pain: Secondary | ICD-10-CM | POA: Diagnosis not present

## 2017-12-30 DIAGNOSIS — R262 Difficulty in walking, not elsewhere classified: Secondary | ICD-10-CM

## 2017-12-30 DIAGNOSIS — M545 Low back pain, unspecified: Secondary | ICD-10-CM

## 2017-12-30 DIAGNOSIS — M6281 Muscle weakness (generalized): Secondary | ICD-10-CM

## 2017-12-30 MED ORDER — CYCLOBENZAPRINE HCL 10 MG PO TABS: 10.0000 mg | ORAL_TABLET | Freq: Two times a day (BID) | ORAL | 0 refills | Status: DC | PRN

## 2017-12-30 NOTE — Progress Notes (Signed)
Flexeril refilled per patient request. Spoke with patient to notify her of refill.  Clayton Bibles, CNM 12/30/17 1:05 PM

## 2017-12-30 NOTE — Therapy (Signed)
[facility] [place] [address]  [place], [place], [address] Phone: [phone]   Fax:  [phone]  Physical Therapy Treatment  Patient Details  Name: Katherine Barber MRN: [id number] Date of Birth: Apr 14, 1985 Referring Provider (PT): Mallie Snooks, [place]   Encounter Date: 12/30/2017  PT End of Session - 12/30/17 1035    Visit Number  4    Number of Visits  8    Date for PT Re-Evaluation  01/27/18    Authorization Type  Medicaid    Authorization Time Period  12/16/17 - 01/05/18    Authorization - Visit Number  3    Authorization - Number of Visits  3    PT Start Time  4166    PT Stop Time  0927    PT Time Calculation (min)  43 min    Activity Tolerance  Patient tolerated treatment well    Behavior During Therapy  [facility] for tasks assessed/performed       Past Medical History:  Diagnosis Date  . Anxiety   . UTI (urinary tract infection)     Past Surgical History:  Procedure Laterality Date  . KNEE ARTHROSCOPY    . NOSE SURGERY    . PALATAL EXPANSION    . WRIST SURGERY      There were no vitals filed for this visit.  Subjective Assessment - 12/30/17 0844    Subjective  Reports she has been pretty good. Had to take her last muscle relaxer. Has been doing a lot of things she probably shouldn't- lifting washing machine and car tires. Reports she would probably benefit from more PT to learn more. Reports improvement in pain, improvement in posture since starting PT. Would like to work on pain levels with daily activities.     Pertinent History  anxiety, knee arthroscopy, palatal expansion, wrist surgery    Diagnostic tests  none    Patient Stated Goals  reduce the pain; figure out what it is    Currently in Pain?  Yes    Pain Score  1     Pain Location  Buttocks    Pain Orientation  Left    Pain Descriptors / Indicators  --   stiffness   Pain Type  Chronic pain         [facility] PT Assessment - 12/30/17 0001       Assessment   Medical Diagnosis  Back pain    Referring Provider (PT)  Mallie Snooks, CNM    Onset Date/Surgical Date  09/09/17      AROM   AROM Assessment Site  Lumbar    Lumbar Flexion  toes    Lumbar Extension  WFL    Lumbar - Right Side Bend  jt line    Lumbar - Left Side Bend  jt line   pain in L LB/butock   Lumbar - Right Rotation  WFL    Lumbar - Left Rotation  [facility]      Strength   Strength Assessment Site  Hip;Knee;Ankle    Right/Left Hip  Right;Left    Right Hip Flexion  4+/5    Right Hip ABduction  4+/5    Right Hip ADduction  4+/5    Left Hip Flexion  4+/5    Left Hip ABduction  4+/5    Left Hip ADduction  4+/5    Right/Left Knee  Right;Left    Right Knee Flexion  4/5    Right Knee Extension  4+/5    Left Knee Flexion  4/5    Left Knee Extension  4+/5    Right/Left Ankle  Right;Left    Right Ankle Dorsiflexion  4+/5    Right Ankle Plantar Flexion  4+/5    Left Ankle Dorsiflexion  4+/5    Left Ankle Plantar Flexion  4+/5                   [facility] Adult PT Treatment/Exercise - 12/30/17 0001      Exercises   Exercises  Lumbar;Knee/Hip      Lumbar Exercises: Standing   Other Standing Lumbar Exercises  standing HS curl with yellow TB and 2 ski poles x10 each LE   reporting burning in standing hip     Lumbar Exercises: Sidelying   Clam  Right;Left;10 reps;Limitations    Clam Limitations  --   red TB around knees; cues for form   Other Sidelying Lumbar Exercises  sidelying trunk rotation 10x each side      Lumbar Exercises: Quadruped   Madcat/Old Horse  10 reps    Madcat/Old Horse Limitations  cues for inhale/exhale- still most limited in pos pelvic tilt      Knee/Hip Exercises: Standing   Wall Squat  1 set;10 reps;Limitations    Wall Squat Limitations  cues to perform to tolerance only; cues to avoid valsalva      Knee/Hip Exercises: Seated   Other Seated Knee/Hip Exercises  sitting on green pball pelvic tilts ant/pos x10    Other  Seated Knee/Hip Exercises  sitting on green pball resisted trunk rotation with red TB 10x each side   pt with c/o fatigue   Marching  Strengthening;Right;Left;1 set;20 reps;Limitations    Marching Limitations  sitting on green pball with TrA contraction   instability on L LE     Knee/Hip Exercises: Supine   Bridges with Clamshell  Strengthening;Both;1 set;Limitations;15 reps   red TB around knees            PT Education - 12/30/17 1034    Education Details  update to HEP; administered yellow TB    Person(s) Educated  Patient    Methods  Explanation;Demonstration;Tactile cues;Verbal cues;Handout    Comprehension  Verbalized understanding;Returned demonstration       PT Short Term Goals - 12/30/17 0849      PT SHORT TERM GOAL #1   Title  Patient to be independent with initial HEP.    Time  1    Period  Weeks    Status  Achieved        PT Long Term Goals - 12/30/17 0849      PT LONG TERM GOAL #1   Title  Patient to be independent with advanced HEP.    Time  4    Period  Weeks    Status  Partially Met   met for current   Target Date  01/27/18      PT LONG TERM GOAL #2   Title  Patient to demonstrate [facility] and pain-free lumbar AROM.    Time  4    Period  Weeks    Status  Partially Met   improvement noted in B sidebending and extension tolerance/ROM   Target Date  01/27/18      PT LONG TERM GOAL #3   Title  Patient to demonstrate >=4+/5 strength in B LEs.    Time  4    Period  Weeks    Status  Partially  Met   good improvement in hip strength, B hamstring strength most limited   Target Date  01/27/18      PT LONG TERM GOAL #4   Title  Patient to report tolerance of 65mn of walking without pain limiting.     Time  4    Period  Weeks    Status  Achieved   reports no pain with walking   Target Date  01/27/18      PT LONG TERM GOAL #5   Title  Patient to report improved sleeping tolerance by 50%.    Time  4    Period  Weeks    Status  Achieved   reports  50% improvement in sleeping tolerance d/t pain   Target Date  01/27/18      Additional Long Term Goals   Additional Long Term Goals  Yes      PT LONG TERM GOAL #6   Title  Patient to report 2 hours of sitting before c/o pain.    Time  4    Period  Weeks    Status  New    Target Date  01/27/18            Plan - 12/30/17 1041    Clinical Impression Statement  Patient arrived to session with report of good benefit in pain levels and improvement in posture since starting PT. Admits to performing heavy lifting activities "that I probably shouldn't be doing" since last session and having to take muscle relaxer as a result. Educated patient on avoiding heavy lifting during pregnancy- patient reported understanding. Updated goals- good improvement demonstrated in hip strength, B hamstring strength most limited. Improvement noted in B lumbar sidebending and extension tolerance/ROM. Patient has met walking goal at this time, noting no pain with walking. Does report persisting pain with prolonged sitting- adjusted goals to reflect this impairment. Has met sleeping tolerance goal at this time. Patient reporting improved tolerance of bridges today- able to lift farther without pain. Tolerated introduction of sidelying trunk rotation and wall squats without complaints. Addressed hamstring weakness with standing HS curl and added this exercise to HEP. Advised to perform with UE support for balance. Patient reported understanding. Patient showing good progress with PT this far, will benefit from continued skilled PT services 1x/week for 4 weeks to address remaining goals.     Clinical Impairments Affecting Rehab Potential  anxiety, knee arthroscopy, palatal expansion, wrist surgery; current pregnancy    PT Frequency  1x / week    PT Duration  4 weeks    PT Treatment/Interventions  ADLs/Self Care Home Management;Cryotherapy;Moist Heat;DME Instruction;Gait training;Stair training;Functional mobility  training;Therapeutic activities;Therapeutic exercise;Manual techniques;Patient/family education;Neuromuscular re-education;Balance training;Passive range of motion;Energy conservation    Consulted and Agree with Plan of Care  Patient       Patient will benefit from skilled therapeutic intervention in order to improve the following deficits and impairments:  Decreased activity tolerance, Decreased strength, Pain, Difficulty walking, Improper body mechanics, Hypermobility, Postural dysfunction  Visit Diagnosis: Chronic left-sided low back pain without sciatica  Muscle weakness (generalized)  Difficulty in walking, not elsewhere classified     Problem List Patient Active Problem List   Diagnosis Date Noted  . Encounter for supervision of normal pregnancy, unspecified, unspecified trimester 10/17/2017    YJanene Harvey PT, DPT 12/30/17 10:43 AM   [facility] [address] [place] [address]Phone: [phone]  Fax:  [phone]  Name: Katherine Barber MRN: [id number] Date of Birth: [birth year]

## 2018-01-08 ENCOUNTER — Encounter: Payer: Self-pay | Admitting: Obstetrics and Gynecology

## 2018-01-08 ENCOUNTER — Ambulatory Visit (INDEPENDENT_AMBULATORY_CARE_PROVIDER_SITE_OTHER): Payer: Medicaid Other | Admitting: Obstetrics and Gynecology

## 2018-01-08 DIAGNOSIS — Z3480 Encounter for supervision of other normal pregnancy, unspecified trimester: Secondary | ICD-10-CM

## 2018-01-08 MED ORDER — CYCLOBENZAPRINE HCL 10 MG PO TABS: 10.0000 mg | ORAL_TABLET | Freq: Two times a day (BID) | ORAL | 0 refills | Status: DC | PRN

## 2018-01-08 NOTE — Progress Notes (Signed)
Pt presents for ROB c/o low back pain, requests rf on Flexeril.

## 2018-01-08 NOTE — Patient Instructions (Signed)
[facility] [address], [address] [place] [phone]  [facility] [address] [place] [phone] [facility] [address] [phone]  [facility] Main: [address] [phone] West: [address] [phone]  [facility] [address], [place] [phone]  [facility] [address], [place] [phone]  [facility] [address], [place] [phone]  [facility] [place]: [address], [address] [phone] [place]: [address], [address] [phone] [place]: [address], [address] [phone] [place]: [address], [address] [phone]  [facility] [address], [place] [phone]  [facility] [address], [address] [phone]  [facility] [address] [phone]  [facility] [address], [place] [phone]  [facility] [address], [place] [phone]  [facility] [address], [place] [phone]  [facility] [address], [place] [phone]  [facility] @ [place]: [address], [place] [phone] [facility] @ [place]: [address], [place] [phone] Peds @ [place]: [address], [place] [phone] Peds @ [place]: [address], [place] [phone]  [facility] [address], [place] [phone]  [facility] [address], [place] [phone]  Private Pediatrician Jack Amos, MD [address], [place] [phone] Shilpa Gosrani, MD [address], [place] [phone] David Rubin, MD [address], [address] [phone] Nnaemeka-Okoy[place], MD ([facility]) [address], [place] [phone]        

## 2018-01-08 NOTE — Progress Notes (Signed)
   PRENATAL VISIT NOTE  Subjective:  Katherine Barber is a 32 y.o. G2P0010 at [redacted]w[redacted]d being seen today for ongoing prenatal care.  She is currently monitored for the following issues for this low-risk pregnancy and has Encounter for supervision of normal pregnancy, unspecified, unspecified trimester on their problem list.  Patient reports backache.  Contractions: Not present. Vag. Bleeding: None.  Movement: Present. Denies leaking of fluid.   The following portions of the patient's history were reviewed and updated as appropriate: allergies, current medications, past family history, past medical history, past social history, past surgical history and problem list. Problem list updated.  Objective:   Vitals:   01/08/18 0917  BP: 113/74  Pulse: 97  Weight: 132 lb 3.2 oz (60 kg)    Fetal Status: Fetal Heart Rate (bpm): 148 Fundal Height: 21 cm Movement: Present     General:  Alert, oriented and cooperative. Patient is in no acute distress.  Skin: Skin is warm and dry. No rash noted.   Cardiovascular: Normal heart rate noted  Respiratory: Normal respiratory effort, no problems with respiration noted  Abdomen: Soft, gravid, appropriate for gestational age.  Pain/Pressure: Absent     Pelvic: Cervical exam deferred        Extremities: Normal range of motion.  Edema: None  Mental Status: Normal mood and affect. Normal behavior. Normal judgment and thought content.   Assessment and Plan:  Pregnancy: G2P0010 at [redacted]w[redacted]d  1. Supervision of other normal pregnancy, antepartum Patient is doing well Continue physical therapy for back pain. Refill on Flexeril provided Third trimester labs next visit Follow up anatomy ultrasound  Preterm labor symptoms and general obstetric precautions including but not limited to vaginal bleeding, contractions, leaking of fluid and fetal movement were reviewed in detail with the patient. Please refer to After Visit Summary for other counseling recommendations.    Return in about 4 weeks (around 02/05/2018) for ROB, 2 hr glucola next visit.  Future Appointments  Date Time Provider Department Center  01/09/2018  8:45 AM Maryruth Bun, PT [facility]  01/13/2018  9:30 AM Maryruth Bun, PT [facility]  01/20/2018  8:45 AM Maryruth Bun, PT [facility]  01/27/2018  9:30 AM Maryruth Bun, PT [facility]  02/13/2018  9:30 AM [facility] [place] [facility]    Catalina Antigua, MD

## 2018-01-09 ENCOUNTER — Encounter: Payer: Self-pay | Admitting: Physical Therapy

## 2018-01-09 ENCOUNTER — Ambulatory Visit: Payer: Medicaid Other | Attending: Advanced Practice Midwife | Admitting: Physical Therapy

## 2018-01-09 DIAGNOSIS — R262 Difficulty in walking, not elsewhere classified: Secondary | ICD-10-CM | POA: Insufficient documentation

## 2018-01-09 DIAGNOSIS — M545 Low back pain, unspecified: Secondary | ICD-10-CM

## 2018-01-09 DIAGNOSIS — G8929 Other chronic pain: Secondary | ICD-10-CM

## 2018-01-09 DIAGNOSIS — M6281 Muscle weakness (generalized): Secondary | ICD-10-CM | POA: Diagnosis not present

## 2018-01-09 NOTE — Therapy (Signed)
[facility] [address]  [place], [place], [address] Phone: [phone]   Fax:  [phone]  Physical Therapy Treatment  Patient Details  Name: Katherine Barber MRN: [id number] Date of Birth: 1985-03-22 Referring Provider (PT): Mallie Snooks, [place]   Encounter Date: 01/09/2018  PT End of Session - 01/09/18 1015    Visit Number  5    Number of Visits  8    Date for PT Re-Evaluation  01/27/18    Authorization Type  Medicaid    Authorization Time Period  11/08 - 12/05    Authorization - Visit Number  1    Authorization - Number of Visits  4    PT Start Time  0848    PT Stop Time  0928    PT Time Calculation (min)  40 min    Activity Tolerance  Patient tolerated treatment well    Behavior During Therapy  [facility] for tasks assessed/performed       Past Medical History:  Diagnosis Date  . Anxiety   . UTI (urinary tract infection)     Past Surgical History:  Procedure Laterality Date  . KNEE ARTHROSCOPY    . NOSE SURGERY    . PALATAL EXPANSION    . WRIST SURGERY      There were no vitals filed for this visit.  Subjective Assessment - 01/09/18 0850    Subjective  Reports she has not been as active with her HEP this week because of being busy. Had more pain this week- reports she is not getting any rest.     Pertinent History  anxiety, knee arthroscopy, palatal expansion, wrist surgery    Diagnostic tests  none    Patient Stated Goals  reduce the pain; figure out what it is    Currently in Pain?  No/denies                       [facility] Adult PT Treatment/Exercise - 01/09/18 0001      Exercises   Exercises  Lumbar;Knee/Hip      Lumbar Exercises: Stretches   Other Lumbar Stretch Exercise  windshield wipers x20 to tol      Lumbar Exercises: Supine   Bridge  15 reps;Limitations    Bridge Limitations  red TB around knees    Other Supine Lumbar Exercises  overhead reach in hooklying with yellow  medball x10   cues to maintain pos pelvic tilt and TrA   Other Supine Lumbar Exercises  toe taps + TrA contraction B LEs x20    cues to maintain pos pelvic tilt and TrA contraction     Lumbar Exercises: Quadruped   Opposite Arm/Leg Raise  Right arm/Left leg;Left arm/Right leg;10 reps;Limitations    Opposite Arm/Leg Raise Limitations  good effort to maintain neutral spine    Other Quadruped Lumbar Exercises  child's pose 5x3" to front, L, R to tolerance   cues to correct form and positioning   Other Quadruped Lumbar Exercises  R & L fire hydrant x5 each side   cues for neutral spine     Knee/Hip Exercises: Standing   Wall Squat  2 sets;10 reps;Limitations   cues required for R weight shift   Wall Squat Limitations  1st set red TB around knees; 2nd set with bell between knees    Other Standing Knee Exercises  monster walk with yellow TB around ankles 2x68f to tol   good tolerance  PT Short Term Goals - 12/30/17 0849      PT SHORT TERM GOAL #1   Title  Patient to be independent with initial HEP.    Time  1    Period  Weeks    Status  Achieved        PT Long Term Goals - 12/30/17 0849      PT LONG TERM GOAL #1   Title  Patient to be independent with advanced HEP.    Time  4    Period  Weeks    Status  Partially Met   met for current   Target Date  01/27/18      PT LONG TERM GOAL #2   Title  Patient to demonstrate [facility] and pain-free lumbar AROM.    Time  4    Period  Weeks    Status  Partially Met   improvement noted in B sidebending and extension tolerance/ROM   Target Date  01/27/18      PT LONG TERM GOAL #3   Title  Patient to demonstrate >=4+/5 strength in B LEs.    Time  4    Period  Weeks    Status  Partially Met   good improvement in hip strength, B hamstring strength most limited   Target Date  01/27/18      PT LONG TERM GOAL #4   Title  Patient to report tolerance of 58mn of walking without pain limiting.     Time  4    Period   Weeks    Status  Achieved   reports no pain with walking   Target Date  01/27/18      PT LONG TERM GOAL #5   Title  Patient to report improved sleeping tolerance by 50%.    Time  4    Period  Weeks    Status  Achieved   reports 50% improvement in sleeping tolerance d/t pain   Target Date  01/27/18      Additional Long Term Goals   Additional Long Term Goals  Yes      PT LONG TERM GOAL #6   Title  Patient to report 2 hours of sitting before c/o pain.    Time  4    Period  Weeks    Status  New    Target Date  01/27/18            Plan - 01/09/18 1015    Clinical Impression Statement  Patient arrived to session with report of increased pain this week secondary to not performing HEP as consistently and not getting much rest.  Spoke with patient about getting back on track with HEP and avoiding fatigue during pregnancy. Report of nonpainful popping in LB this week. Progressed core strengthening today with toe taps and overhead medball reach with cues to maintain posterior pelvic tilt and TrA contraction. Patient with good effort throughout session. Patient still with difficulty and fatigue with R & L side stretch during child pose. Cues required for R weight shift with wall squats as patient still exhibiting weakness and preference for increased use of L side. Ended session with no complaint of pain, only mentioning muscle fatigue in LEs.     Clinical Impairments Affecting Rehab Potential  anxiety, knee arthroscopy, palatal expansion, wrist surgery; current pregnancy    PT Treatment/Interventions  ADLs/Self Care Home Management;Cryotherapy;Moist Heat;DME Instruction;Gait training;Stair training;Functional mobility training;Therapeutic activities;Therapeutic exercise;Manual techniques;Patient/family education;Neuromuscular re-education;Balance training;Passive range of motion;Energy conservation  Consulted and Agree with Plan of Care  Patient       Patient will benefit from skilled  therapeutic intervention in order to improve the following deficits and impairments:  Decreased activity tolerance, Decreased strength, Pain, Difficulty walking, Improper body mechanics, Hypermobility, Postural dysfunction  Visit Diagnosis: Chronic left-sided low back pain without sciatica  Muscle weakness (generalized)  Difficulty in walking, not elsewhere classified     Problem List Patient Active Problem List   Diagnosis Date Noted  . Encounter for supervision of normal pregnancy, unspecified, unspecified trimester 10/17/2017    Janene Harvey, PT, DPT 01/09/18 10:22 AM   [facility] [address]  [place], [place], [address] Phone: [phone]   Fax:  [phone]  Name: Katherine Barber MRN: [id number] Date of Birth: 03-Jan-1986

## 2018-01-13 ENCOUNTER — Ambulatory Visit: Payer: Medicaid Other | Admitting: Physical Therapy

## 2018-01-13 ENCOUNTER — Encounter: Payer: Self-pay | Admitting: Physical Therapy

## 2018-01-13 DIAGNOSIS — M545 Low back pain, unspecified: Secondary | ICD-10-CM

## 2018-01-13 DIAGNOSIS — M6281 Muscle weakness (generalized): Secondary | ICD-10-CM

## 2018-01-13 DIAGNOSIS — R262 Difficulty in walking, not elsewhere classified: Secondary | ICD-10-CM | POA: Diagnosis not present

## 2018-01-13 DIAGNOSIS — G8929 Other chronic pain: Secondary | ICD-10-CM

## 2018-01-13 NOTE — Therapy (Signed)
[facility] [address]  [place], [place], [address] Phone: [phone]   Fax:  [phone]  Physical Therapy Treatment  Patient Details  Name: Katherine Barber MRN: [id number] Date of Birth: 1985-09-14 Referring Provider (PT): Mallie Snooks, CNM   Encounter Date: 01/13/2018  PT End of Session - 01/13/18 1012    Visit Number  6    Number of Visits  8    Date for PT Re-Evaluation  01/27/18    Authorization Type  Medicaid    Authorization Time Period  11/08 - 12/05    Authorization - Visit Number  2    Authorization - Number of Visits  4    PT Start Time  0929    PT Stop Time  1010    PT Time Calculation (min)  41 min    Activity Tolerance  Patient tolerated treatment well    Behavior During Therapy  [facility] for tasks assessed/performed       Past Medical History:  Diagnosis Date  . Anxiety   . UTI (urinary tract infection)     Past Surgical History:  Procedure Laterality Date  . KNEE ARTHROSCOPY    . NOSE SURGERY    . PALATAL EXPANSION    . WRIST SURGERY      There were no vitals filed for this visit.  Subjective Assessment - 01/13/18 0928    Subjective  Reports that she is tired. Worked a lot but back held up well. Says she has improved a lot wiht PT.    Pertinent History  anxiety, knee arthroscopy, palatal expansion, wrist surgery    Diagnostic tests  none    Patient Stated Goals  reduce the pain; figure out what it is    Currently in Pain?  Yes    Pain Score  1     Pain Location  Buttocks    Pain Orientation  Left    Pain Descriptors / Indicators  Aching    Pain Type  Chronic pain                       [facility] Adult PT Treatment/Exercise - 01/13/18 0001      Exercises   Exercises  Lumbar;Knee/Hip      Lumbar Exercises: Stretches   Other Lumbar Stretch Exercise  sitting KTOS 20" each side to tol      Lumbar Exercises: Supine   Dead Bug  20 reps;Limitations    Dead Bug  Limitations  small pball on belly; cues to maintain pos pelvic tilt    Bridge  Limitations;15 reps    Bridge Limitations  red TB around knees    Other Supine Lumbar Exercises  bridge + with yellow medball overhead lift x10      Lumbar Exercises: Quadruped   Madcat/Old Horse  10 reps    Madcat/Old Horse Limitations  cues for rhythmic breathing    Opposite Arm/Leg Raise  Right arm/Left leg;Left arm/Right leg;10 reps;Limitations    Opposite Arm/Leg Raise Limitations  cues to correct over-extension    Other Quadruped Lumbar Exercises  thread the needle x10 each LE tol       Knee/Hip Exercises: Standing   Other Standing Knee Exercises  sidestepping with yellow TB around toes    cues to avoid lateral trunk lean     Knee/Hip Exercises: Sidelying   Hip ADduction  Strengthening;Right;Left;1 set;10 reps;Limitations    Hip ADduction Limitations  cues for  alignment    Clams  10x with red TB around knees and pulse    reporting muscle burn     Knee/Hip Exercises: Prone   Other Prone Exercises  fire hydrant x5 each side    cues to avoid trunk rotation            PT Education - 01/13/18 1012    Education Details  HEP update and consolidation    Person(s) Educated  Patient    Methods  Explanation;Demonstration;Tactile cues;Verbal cues;Handout    Comprehension  Verbalized understanding;Returned demonstration       PT Short Term Goals - 12/30/17 0849      PT SHORT TERM GOAL #1   Title  Patient to be independent with initial HEP.    Time  1    Period  Weeks    Status  Achieved        PT Long Term Goals - 12/30/17 0849      PT LONG TERM GOAL #1   Title  Patient to be independent with advanced HEP.    Time  4    Period  Weeks    Status  Partially Met   met for current   Target Date  01/27/18      PT LONG TERM GOAL #2   Title  Patient to demonstrate [facility] and pain-free lumbar AROM.    Time  4    Period  Weeks    Status  Partially Met   improvement noted in B sidebending and  extension tolerance/ROM   Target Date  01/27/18      PT LONG TERM GOAL #3   Title  Patient to demonstrate >=4+/5 strength in B LEs.    Time  4    Period  Weeks    Status  Partially Met   good improvement in hip strength, B hamstring strength most limited   Target Date  01/27/18      PT LONG TERM GOAL #4   Title  Patient to report tolerance of 68mn of walking without pain limiting.     Time  4    Period  Weeks    Status  Achieved   reports no pain with walking   Target Date  01/27/18      PT LONG TERM GOAL #5   Title  Patient to report improved sleeping tolerance by 50%.    Time  4    Period  Weeks    Status  Achieved   reports 50% improvement in sleeping tolerance d/t pain   Target Date  01/27/18      Additional Long Term Goals   Additional Long Term Goals  Yes      PT LONG TERM GOAL #6   Title  Patient to report 2 hours of sitting before c/o pain.    Time  4    Period  Weeks    Status  New    Target Date  01/27/18            Plan - 01/13/18 1013    Clinical Impression Statement  Patient reports she worked a lot over the weekend and did not notice significant increase in pain. Reports she has improved a good deal since starting PT, noting "I have a lot more good days." Progressed hip and core strengthening with bridge with overhead medball reach. Patient with good form and tolerance with this activity- only reporting mild tension in LB with dissipated. Introduced [employer] engineertoday with patient reporting difficulty  but not pain. Cues given to maintain posterior pelvic tilt. Reported muscle fatigue in lateral hips with clamshells with banded resistance and added pulse. Reviewed and consolidated HEP during session for most benefit. Updated HEP to include bird-dog and fire hydrant exercises. Patient reported understanding. C/o LE muscle fatigue at end of session but no pain.     Clinical Impairments Affecting Rehab Potential  anxiety, knee arthroscopy, palatal expansion, wrist  surgery; current pregnancy    PT Treatment/Interventions  ADLs/Self Care Home Management;Cryotherapy;Moist Heat;DME Instruction;Gait training;Stair training;Functional mobility training;Therapeutic activities;Therapeutic exercise;Manual techniques;Patient/family education;Neuromuscular re-education;Balance training;Passive range of motion;Energy conservation    Consulted and Agree with Plan of Care  Patient       Patient will benefit from skilled therapeutic intervention in order to improve the following deficits and impairments:  Decreased activity tolerance, Decreased strength, Pain, Difficulty walking, Improper body mechanics, Hypermobility, Postural dysfunction  Visit Diagnosis: Chronic left-sided low back pain without sciatica  Muscle weakness (generalized)  Difficulty in walking, not elsewhere classified     Problem List Patient Active Problem List   Diagnosis Date Noted  . Encounter for supervision of normal pregnancy, unspecified, unspecified trimester 10/17/2017     Janene Harvey, PT, DPT 01/13/18 10:19 AM   [facility] [address]  [place], [place], [address] Phone: [phone]   Fax:  [phone]  Name: Katherine Barber MRN: [id number] Date of Birth: March 10, 1985

## 2018-01-20 ENCOUNTER — Encounter: Payer: Self-pay | Admitting: Physical Therapy

## 2018-01-20 ENCOUNTER — Ambulatory Visit: Payer: Medicaid Other | Admitting: Physical Therapy

## 2018-01-20 DIAGNOSIS — M545 Low back pain, unspecified: Secondary | ICD-10-CM

## 2018-01-20 DIAGNOSIS — R262 Difficulty in walking, not elsewhere classified: Secondary | ICD-10-CM | POA: Diagnosis not present

## 2018-01-20 DIAGNOSIS — G8929 Other chronic pain: Secondary | ICD-10-CM | POA: Diagnosis not present

## 2018-01-20 DIAGNOSIS — M6281 Muscle weakness (generalized): Secondary | ICD-10-CM

## 2018-01-20 NOTE — Therapy (Signed)
[facility] [address]  [place], [place], [address] Phone: [phone]   Fax:  [phone]  Physical Therapy Treatment  Patient Details  Name: Katherine Barber MRN: [id number] Date of Birth: 1985-03-18 Referring Provider (PT): Mallie Snooks, [place]   Encounter Date: 01/20/2018  PT End of Session - 01/20/18 0930    Visit Number  7    Number of Visits  8    Date for PT Re-Evaluation  01/27/18    Authorization Type  Medicaid    Authorization Time Period  11/08 - 12/05    Authorization - Visit Number  3    Authorization - Number of Visits  4    PT Start Time  6811    PT Stop Time  0928    PT Time Calculation (min)  41 min    Activity Tolerance  Patient tolerated treatment well    Behavior During Therapy  [facility] for tasks assessed/performed       Past Medical History:  Diagnosis Date  . Anxiety   . UTI (urinary tract infection)     Past Surgical History:  Procedure Laterality Date  . KNEE ARTHROSCOPY    . NOSE SURGERY    . PALATAL EXPANSION    . WRIST SURGERY      There were no vitals filed for this visit.  Subjective Assessment - 01/20/18 0848    Subjective  Reports she thinks she is getting a cold. Her back has been doing well. No issues with HEP.    Pertinent History  anxiety, knee arthroscopy, palatal expansion, wrist surgery    Diagnostic tests  none    Patient Stated Goals  reduce the pain; figure out what it is    Currently in Pain?  No/denies                       [facility] Adult PT Treatment/Exercise - 01/20/18 0001      Self-Care   Self-Care  Lifting    Lifting  practice and edu on lifting weight blue and orange medball from ground to simulate lifting baby      Exercises   Exercises  Lumbar;Knee/Hip      Lumbar Exercises: Supine   Dead Bug  10 reps   mild difficulty with coordination   Dead Bug Limitations  B UE/LE suspended at 90 deg     Bridge  10 reps    Bridge  Limitations  green TB around knees    Other Supine Lumbar Exercises  bridge + with yellow medball overhead lift x10    Other Supine Lumbar Exercises  overhead medball reach with blue medball x10   cues for pos pelvic tilt     Lumbar Exercises: Quadruped   Madcat/Old Horse  15 reps    Madcat/Old Horse Limitations  cues for rhythmic breathing    Other Quadruped Lumbar Exercises  thread the needle x10 each LE tol    cues to incorporate lumbar rotation     Knee/Hip Exercises: Standing   Other Standing Knee Exercises  R & L around the worlds x10 each LE      Knee/Hip Exercises: Sidelying   Hip ABduction  Strengthening;Right;Left;1 set;10 reps;Limitations    Hip ABduction Limitations  2# on each LE; cues for proper alignment    Hip ADduction  Strengthening;Right;Left;1 set;10 reps;Limitations    Hip ADduction Limitations  2# on each LE    Clams  10x with  green TB around knees              PT Education - 01/20/18 0930    Education Details  administered green TB for clamshells; edu on proper lifting mechanics for lifting child off the ground; administered contact info for [facility]    Person(s) Educated  Patient    Methods  Explanation;Demonstration;Tactile cues;Verbal cues;Handout    Comprehension  Verbalized understanding;Returned demonstration       PT Short Term Goals - 12/30/17 0849      PT SHORT TERM GOAL #1   Title  Patient to be independent with initial HEP.    Time  1    Period  Weeks    Status  Achieved        PT Long Term Goals - 12/30/17 0849      PT LONG TERM GOAL #1   Title  Patient to be independent with advanced HEP.    Time  4    Period  Weeks    Status  Partially Met   met for current   Target Date  01/27/18      PT LONG TERM GOAL #2   Title  Patient to demonstrate [facility] and pain-free lumbar AROM.    Time  4    Period  Weeks    Status  Partially Met   improvement noted in B sidebending and extension tolerance/ROM   Target Date   01/27/18      PT LONG TERM GOAL #3   Title  Patient to demonstrate >=4+/5 strength in B LEs.    Time  4    Period  Weeks    Status  Partially Met   good improvement in hip strength, B hamstring strength most limited   Target Date  01/27/18      PT LONG TERM GOAL #4   Title  Patient to report tolerance of 39mn of walking without pain limiting.     Time  4    Period  Weeks    Status  Achieved   reports no pain with walking   Target Date  01/27/18      PT LONG TERM GOAL #5   Title  Patient to report improved sleeping tolerance by 50%.    Time  4    Period  Weeks    Status  Achieved   reports 50% improvement in sleeping tolerance d/t pain   Target Date  01/27/18      Additional Long Term Goals   Additional Long Term Goals  Yes      PT LONG TERM GOAL #6   Title  Patient to report 2 hours of sitting before c/o pain.    Time  4    Period  Weeks    Status  New    Target Date  01/27/18            Plan - 01/20/18 0931    Clinical Impression Statement  Patient arrived to session with no new concerns and reporting no pain today. Reports she will be ready to transition to HEP next session. Worked on progressive core and hip strengthening exercises today with intermittent cues to correct form. Progressed clamshells with green TB and sidelying hip abduction and adduction with weighted resistance today with good tolerance. Required cues to incorporate increased lumbar rotation with thread the needle exercise. Educated patient on proper lifting mechanics to lift babe from ground, using weighted medball. Patient with good carryover of technique. Ended  session with no complaints. Patient tolerating progression of ther-ex well and noting decreased pain levels. Plan to D/C next session.     Clinical Impairments Affecting Rehab Potential  anxiety, knee arthroscopy, palatal expansion, wrist surgery; current pregnancy    PT Treatment/Interventions  ADLs/Self Care Home  Management;Cryotherapy;Moist Heat;DME Instruction;Gait training;Stair training;Functional mobility training;Therapeutic activities;Therapeutic exercise;Manual techniques;Patient/family education;Neuromuscular re-education;Balance training;Passive range of motion;Energy conservation    PT Next Visit Plan  D/C next session    Consulted and Agree with Plan of Care  Patient       Patient will benefit from skilled therapeutic intervention in order to improve the following deficits and impairments:  Decreased activity tolerance, Decreased strength, Pain, Difficulty walking, Improper body mechanics, Hypermobility, Postural dysfunction  Visit Diagnosis: Chronic left-sided low back pain without sciatica  Muscle weakness (generalized)  Difficulty in walking, not elsewhere classified     Problem List Patient Active Problem List   Diagnosis Date Noted  . Encounter for supervision of normal pregnancy, unspecified, unspecified trimester 10/17/2017     Janene Harvey, PT, DPT 01/20/18 9:38 AM   [facility] [address]  [place], [place], [address] Phone: [phone]   Fax:  [phone]  Name: Katherine Barber MRN: [id number] Date of Birth: 12-21-1985

## 2018-01-22 ENCOUNTER — Telehealth: Payer: Self-pay | Admitting: *Deleted

## 2018-01-22 NOTE — Telephone Encounter (Signed)
Pt returned call to office. Pt made aware of OTC meds for cold/cough. Pt states she feels she may have a cyst on inner thigh. Pt made aware she may use Tylenol if pain associated, warm compresses. Pt advised she may be seen if she feels need or can address at her appt on 12/5. Pt will wait until appt.  Pt made aware if any changes, she may call office for sooner appt or be seen by urgent care/PCP.  Pt states understanding.

## 2018-01-22 NOTE — Telephone Encounter (Signed)
Pt called to office stating she is 25 weeks and has cough, would like to know what to take. Pt also takes she has a bump on thigh and ? Concerned.  Return call to pt. No answer, no VM.

## 2018-01-27 ENCOUNTER — Encounter: Payer: Self-pay | Admitting: Physical Therapy

## 2018-01-27 ENCOUNTER — Ambulatory Visit: Payer: Medicaid Other | Admitting: Physical Therapy

## 2018-01-27 DIAGNOSIS — M6281 Muscle weakness (generalized): Secondary | ICD-10-CM

## 2018-01-27 DIAGNOSIS — M545 Low back pain, unspecified: Secondary | ICD-10-CM

## 2018-01-27 DIAGNOSIS — G8929 Other chronic pain: Secondary | ICD-10-CM | POA: Diagnosis not present

## 2018-01-27 DIAGNOSIS — R262 Difficulty in walking, not elsewhere classified: Secondary | ICD-10-CM

## 2018-01-27 NOTE — Therapy (Signed)
[facility] [address]  [place], [place], [address] Phone: [phone]   Fax:  [phone]  Physical Therapy Discharge Summary  Patient Details  Name: Katherine Barber MRN: [id number] Date of Birth: [birth year] Referring Provider (PT): Mallie Snooks, CNM   Encounter Date: 01/27/2018  PT End of Session - 01/27/18 1017    Visit Number  8    Number of Visits  8    Date for PT Re-Evaluation  01/27/18    Authorization Type  Medicaid    Authorization Time Period  11/08 - 12/05    Authorization - Visit Number  4    Authorization - Number of Visits  4    PT Start Time  0929    PT Stop Time  1011    PT Time Calculation (min)  42 min    Activity Tolerance  Patient tolerated treatment well    Behavior During Therapy  [facility] for tasks assessed/performed       Past Medical History:  Diagnosis Date  . Anxiety   . UTI (urinary tract infection)     Past Surgical History:  Procedure Laterality Date  . KNEE ARTHROSCOPY    . NOSE SURGERY    . PALATAL EXPANSION    . WRIST SURGERY      There were no vitals filed for this visit.  Subjective Assessment - 01/27/18 0927    Subjective  Reports she has been doing well. Still having cold or bronchitis symptoms. Tried to go to [facility] but there was a 3 hour wait. Has noticed intermittent B hip pain- says this it likely from pregnancy. Reports she feels comfortable about wrapping up with PT today. Reports 95% improvement since initial eval.     Pertinent History  anxiety, knee arthroscopy, palatal expansion, wrist surgery    Patient Stated Goals  reduce the pain; figure out what it is    Currently in Pain?  No/denies         [facility] PT Assessment - 01/27/18 0001      AROM   AROM Assessment Site  Lumbar    Lumbar Flexion  toes    Lumbar Extension  WFL    Lumbar - Right Side Bend  jt line    Lumbar - Left Side Bend  jt line    Lumbar - Right Rotation  WFL    Lumbar - Left  Rotation  [facility]      Strength   Right Knee Flexion  4+/5    Left Knee Flexion  4+/5                   [facility] Adult PT Treatment/Exercise - 01/27/18 0001      Exercises   Exercises  Lumbar;Knee/Hip      Lumbar Exercises: Seated   Long Arc Quad on Chair  Strengthening;Right;Left;1 set;10 reps;Limitations    LAQ on Chair Weights (lbs)  3    LAQ on Chair Limitations  sitting on green pball   difficuilty on L LE   Hip Flexion on Ball  AROM;Both;20 reps;Limitations    Hip Flexion on Ball Limitations  maintaining TrA contraction     Other Seated Lumbar Exercises  sitting on pball alt UE/LE lift x20    Other Seated Lumbar Exercises  sitting on pball pelvic tilts x15 cycles      Knee/Hip Exercises: Standing   Functional Squat  1 set;10 reps    Functional Squat Limitations  touching  bottom on mat    Other Standing Knee Exercises  R & L around the worlds x10 each LE   more difficulty standing on  RLE     Knee/Hip Exercises: Seated   Other Seated Knee/Hip Exercises  sitting on green pball B UE rows with red TB   cues for rhythmic breathing   Other Seated Knee/Hip Exercises  sitting on green pball PNF D2 flexion x10 each side   cues to avoid trunk rotation            PT Education - 01/27/18 1017    Education Details  update to HEP; administered red TB    Person(s) Educated  Patient    Methods  Explanation;Demonstration;Tactile cues;Verbal cues;Handout    Comprehension  Verbalized understanding;Returned demonstration       PT Short Term Goals - 01/27/18 0932      PT SHORT TERM GOAL #1   Title  Patient to be independent with initial HEP.    Time  1    Period  Weeks    Status  Achieved        PT Long Term Goals - 01/27/18 0932      PT LONG TERM GOAL #1   Title  Patient to be independent with advanced HEP.    Time  4    Period  Weeks    Status  Achieved      PT LONG TERM GOAL #2   Title  Patient to demonstrate [facility] and pain-free lumbar AROM.    Time  4     Period  Weeks    Status  Achieved   lumbar AROM WNL and pain-free     PT LONG TERM GOAL #3   Title  Patient to demonstrate >=4+/5 strength in B LEs.    Time  4    Period  Weeks    Status  Achieved      PT LONG TERM GOAL #4   Title  Patient to report tolerance of 68mn of walking without pain limiting.     Time  4    Period  Weeks    Status  Achieved   reports no pain with walking     PT LONG TERM GOAL #5   Title  Patient to report improved sleeping tolerance by 50%.    Time  4    Period  Weeks    Status  Achieved   reports 50% improvement in sleeping tolerance d/t pain     PT LONG TERM GOAL #6   Title  Patient to report 2 hours of sitting before c/o pain.    Time  4    Period  Weeks    Status  Partially Met   reports tolerance of 1.5 hours before onset of pain           Plan - 01/27/18 1229    Clinical Impression Statement  Patient arrived to session with report of 95% improvement since initial eval. Reports she rarely has pain now and notes "I have many more good days now." Patient has met or partially met all goals at this time. Patient has maintained compliance with HEP throughout POC. Lumbar AROM WNL and pain-free at this time. Patient has also met LE strength, walking, and sleeping tolerance goals. Reports that she can tolerate 1.5 hours of sitting before onset of pain at this time. Focused today's session on core strengthening exercises while sitting on physioball as patient intending to buy a ball for herself.  Patient reporting no pain with any ther-ex done today. Patient received updated HEP and resistance band- all questions answered. Patient to be D'C'd at this time due to meeting all goals and resolution of symptoms.     Clinical Impairments Affecting Rehab Potential  anxiety, knee arthroscopy, palatal expansion, wrist surgery; current pregnancy    PT Treatment/Interventions  ADLs/Self Care Home Management;Cryotherapy;Moist Heat;DME Instruction;Gait  training;Stair training;Functional mobility training;Therapeutic activities;Therapeutic exercise;Manual techniques;Patient/family education;Neuromuscular re-education;Balance training;Passive range of motion;Energy conservation    PT Next Visit Plan  D/C at this time    Consulted and Agree with Plan of Care  Patient       Patient will benefit from skilled therapeutic intervention in order to improve the following deficits and impairments:  Decreased activity tolerance, Decreased strength, Pain, Difficulty walking, Improper body mechanics, Hypermobility, Postural dysfunction  Visit Diagnosis: Chronic left-sided low back pain without sciatica  Muscle weakness (generalized)  Difficulty in walking, not elsewhere classified     Problem List Patient Active Problem List   Diagnosis Date Noted  . Encounter for supervision of normal pregnancy, unspecified, unspecified trimester 10/17/2017    PHYSICAL THERAPY DISCHARGE SUMMARY  Visits from Start of Care: 8  Current functional level related to goals / functional outcomes: See above clinical summary   Remaining deficits: Pain with prolonged sitting   Education / Equipment: HEP  Plan: Patient agrees to discharge.  Patient goals were partially met. Patient is being discharged due to meeting the stated rehab goals.  ?????     Janene Harvey, PT, DPT 01/27/18 12:31 PM   [facility] [address]  [place], [place], [address] Phone: [phone]   Fax:  [phone]  Name: Katherine Barber MRN: [id number] Date of Birth: [birth year]

## 2018-02-05 ENCOUNTER — Ambulatory Visit (INDEPENDENT_AMBULATORY_CARE_PROVIDER_SITE_OTHER): Payer: Medicaid Other | Admitting: Obstetrics and Gynecology

## 2018-02-05 ENCOUNTER — Encounter: Payer: Self-pay | Admitting: Obstetrics and Gynecology

## 2018-02-05 ENCOUNTER — Other Ambulatory Visit: Payer: Self-pay

## 2018-02-05 ENCOUNTER — Other Ambulatory Visit: Payer: Medicaid Other

## 2018-02-05 VITALS — BP 122/71 | HR 95 | Wt 139.0 lb

## 2018-02-05 DIAGNOSIS — Z3480 Encounter for supervision of other normal pregnancy, unspecified trimester: Secondary | ICD-10-CM

## 2018-02-05 DIAGNOSIS — Z23 Encounter for immunization: Secondary | ICD-10-CM | POA: Diagnosis not present

## 2018-02-05 DIAGNOSIS — Z3482 Encounter for supervision of other normal pregnancy, second trimester: Secondary | ICD-10-CM

## 2018-02-05 MED ORDER — CYCLOBENZAPRINE HCL 10 MG PO TABS: 10.0000 mg | ORAL_TABLET | Freq: Two times a day (BID) | ORAL | 0 refills | Status: DC | PRN

## 2018-02-05 MED ORDER — POLYMYXIN B-TRIMETHOPRIM 10000-0.1 UNIT/ML-% OP SOLN: 2.0000 [drp] | Freq: Four times a day (QID) | OPHTHALMIC | 0 refills | Status: DC

## 2018-02-05 NOTE — Progress Notes (Signed)
ROB/GTT.  C/o of left eye pain, filled with "goop" shut yesterday morning.  TDAP given in LD, tolerated well.

## 2018-02-05 NOTE — Progress Notes (Signed)
   PRENATAL VISIT NOTE  Subjective:  Katherine Barber is a 32 y.o. G2P0010 at 1876w6d being seen today for ongoing prenatal care.  She is currently monitored for the following issues for this low-risk pregnancy and has Encounter for supervision of normal pregnancy, unspecified, unspecified trimester on their problem list.  Patient reports left eye conjuctivitis.  Contractions: Not present. Vag. Bleeding: None.  Movement: Present. Denies leaking of fluid.   The following portions of the patient's history were reviewed and updated as appropriate: allergies, current medications, past family history, past medical history, past social history, past surgical history and problem list. Problem list updated.  Objective:   Vitals:   02/05/18 0848  BP: 122/71  Pulse: 95  Weight: 139 lb (63 kg)    Fetal Status: Fetal Heart Rate (bpm): 144 Fundal Height: 27 cm Movement: Present     General:  Alert, oriented and cooperative. Patient is in no acute distress.  Skin: Skin is warm and dry. No rash noted.   Cardiovascular: Normal heart rate noted  Respiratory: Normal respiratory effort, no problems with respiration noted  Abdomen: Soft, gravid, appropriate for gestational age.  Pain/Pressure: Absent     Pelvic: Cervical exam deferred        Extremities: Normal range of motion.  Edema: None  Mental Status: Normal mood and affect. Normal behavior. Normal judgment and thought content.   Assessment and Plan:  Pregnancy: G2P0010 at 7076w6d  1. Supervision of other normal pregnancy, antepartum Patient is doing well Third trimester labs today Patient researching pediatrician Patient plans birth control pills for contraception Rx polymyxin B eye drops  Refill on flexeril provided - Glucose Tolerance, 2 Hours w/1 Hour - CBC - HIV antibody (with reflex) - RPR  Preterm labor symptoms and general obstetric precautions including but not limited to vaginal bleeding, contractions, leaking of fluid and fetal  movement were reviewed in detail with the patient. Please refer to After Visit Summary for other counseling recommendations.  Return in about 2 weeks (around 02/19/2018) for ROB.  Future Appointments  Date Time Provider Department Center  02/05/2018  9:15 AM Danecia Underdown, Gigi GinPeggy, MD [facility] None  02/13/2018  9:30 AM [facility] US 1 [facility]    Catalina AntiguaPeggy Janye Maynor, MD

## 2018-02-06 LAB — CBC
HEMOGLOBIN: 11.9 g/dL (ref 11.1–15.9)
Hematocrit: 34.9 % (ref 34.0–46.6)
MCH: 34.5 pg — AB (ref 26.6–33.0)
MCHC: 34.1 g/dL (ref 31.5–35.7)
MCV: 101 fL — ABNORMAL HIGH (ref 79–97)
Platelets: 218 10*3/uL (ref 150–450)
RBC: 3.45 x10E6/uL — ABNORMAL LOW (ref 3.77–5.28)
RDW: 12.4 % (ref 12.3–15.4)
WBC: 13 10*3/uL — ABNORMAL HIGH (ref 3.4–10.8)

## 2018-02-06 LAB — GLUCOSE TOLERANCE, 2 HOURS W/ 1HR
GLUCOSE, 1 HOUR: 99 mg/dL (ref 65–179)
Glucose, 2 hour: 70 mg/dL (ref 65–152)
Glucose, Fasting: 74 mg/dL (ref 65–91)

## 2018-02-06 LAB — RPR: RPR Ser Ql: NONREACTIVE

## 2018-02-06 LAB — HIV ANTIBODY (ROUTINE TESTING W REFLEX): HIV SCREEN 4TH GENERATION: NONREACTIVE

## 2018-02-13 ENCOUNTER — Encounter (HOSPITAL_COMMUNITY): Payer: Self-pay

## 2018-02-13 ENCOUNTER — Ambulatory Visit (HOSPITAL_COMMUNITY)
Admission: RE | Admit: 2018-02-13 | Discharge: 2018-02-13 | Disposition: A | Discharge status: Home / Self Care | Payer: Medicaid Other | Source: Ambulatory Visit | Attending: Obstetrics and Gynecology | Admitting: Obstetrics and Gynecology

## 2018-02-13 DIAGNOSIS — Z362 Encounter for other antenatal screening follow-up: Secondary | ICD-10-CM | POA: Diagnosis not present

## 2018-02-13 DIAGNOSIS — O359XX0 Maternal care for (suspected) fetal abnormality and damage, unspecified, not applicable or unspecified: Secondary | ICD-10-CM

## 2018-02-13 DIAGNOSIS — O350XX0 Maternal care for (suspected) central nervous system malformation in fetus, not applicable or unspecified: Secondary | ICD-10-CM | POA: Diagnosis not present

## 2018-02-13 DIAGNOSIS — O99333 Smoking (tobacco) complicating pregnancy, third trimester: Secondary | ICD-10-CM

## 2018-02-13 DIAGNOSIS — Z3A28 28 weeks gestation of pregnancy: Secondary | ICD-10-CM | POA: Insufficient documentation

## 2018-02-13 DIAGNOSIS — O3503X0 Maternal care for (suspected) central nervous system malformation or damage in fetus, choroid plexus cysts, not applicable or unspecified: Secondary | ICD-10-CM

## 2018-02-19 ENCOUNTER — Encounter: Payer: Self-pay | Admitting: Certified Nurse Midwife

## 2018-02-19 ENCOUNTER — Ambulatory Visit (INDEPENDENT_AMBULATORY_CARE_PROVIDER_SITE_OTHER): Payer: Medicaid Other | Admitting: Certified Nurse Midwife

## 2018-02-19 VITALS — BP 124/75 | HR 89 | Wt 144.0 lb

## 2018-02-19 DIAGNOSIS — Z3483 Encounter for supervision of other normal pregnancy, third trimester: Secondary | ICD-10-CM

## 2018-02-19 DIAGNOSIS — Z3480 Encounter for supervision of other normal pregnancy, unspecified trimester: Secondary | ICD-10-CM

## 2018-02-19 NOTE — Progress Notes (Signed)
   PRENATAL VISIT NOTE  Subjective:  Katherine Barber is a 32 y.o. G2P0010 at 1460w6d being seen today for ongoing prenatal care.  She is currently monitored for the following issues for this low-risk pregnancy and has Encounter for supervision of normal pregnancy, unspecified, unspecified trimester on their problem list.  Patient reports no complaints.  Contractions: Not present. Vag. Bleeding: None.  Movement: Present. Denies leaking of fluid.   The following portions of the patient's history were reviewed and updated as appropriate: allergies, current medications, past family history, past medical history, past social history, past surgical history and problem list. Problem list updated.  Objective:  Vitals:   02/19/18 1108  BP: 124/75  Pulse: 89  Weight: 144 lb (65.3 kg)    Fetal Status: Fetal Heart Rate (bpm): 142 Fundal Height: 29 cm Movement: Present     General:  Alert, oriented and cooperative. Patient is in no acute distress.  Skin: Skin is warm and dry. No rash noted.   Cardiovascular: Normal heart rate noted  Respiratory: Normal respiratory effort, no problems with respiration noted  Abdomen: Soft, gravid, appropriate for gestational age.  Pain/Pressure: Absent     Pelvic: Cervical exam deferred        Extremities: Normal range of motion.  Edema: None  Mental Status: Normal mood and affect. Normal behavior. Normal judgment and thought content.   Assessment and Plan:  Pregnancy: G2P0010 at 6960w6d  1. Supervision of other normal pregnancy, antepartum - Patient doing well, no complaints  - Anticipatory guidance on upcoming appointments  - Reviewed results of third trimester labs and GTT- normal   Preterm labor symptoms and general obstetric precautions including but not limited to vaginal bleeding, contractions, leaking of fluid and fetal movement were reviewed in detail with the patient. Please refer to After Visit Summary for other counseling recommendations.  Return in  about 2 weeks (around 03/05/2018) for ROB.  Future Appointments  Date Time Provider Department Center  03/09/2018  4:00 PM Leftwich-Kirby, Wilmer FloorLisa A, CNM [facility] None    Sharyon CableVeronica C Tracia Lacomb, CNM

## 2018-02-19 NOTE — Patient Instructions (Addendum)
Fetal Movement Counts °Patient Name: ________________________________________________ Patient Due Date: ____________________ °What is a fetal movement count? ° °A fetal movement count is the number of times that you feel your baby move during a certain amount of time. This may also be called a fetal kick count. A fetal movement count is recommended for every pregnant woman. You may be asked to start counting fetal movements as early as week 28 of your pregnancy. °Pay attention to when your baby is most active. You may notice your baby's sleep and wake cycles. You may also notice things that make your baby move more. You should do a fetal movement count: °· When your baby is normally most active. °· At the same time each day. °A good time to count movements is while you are resting, after having something to eat and drink. °How do I count fetal movements? °1. Find a quiet, comfortable area. Sit, or lie down on your side. °2. Write down the date, the start time and stop time, and the number of movements that you felt between those two times. Take this information with you to your health care visits. °3. For 2 hours, count kicks, flutters, swishes, rolls, and jabs. You should feel at least 10 movements during 2 hours. °4. You may stop counting after you have felt 10 movements. °5. If you do not feel 10 movements in 2 hours, have something to eat and drink. Then, keep resting and counting for 1 hour. If you feel at least 4 movements during that hour, you may stop counting. °Contact a health care provider if: °· You feel fewer than 4 movements in 2 hours. °· Your baby is not moving like he or she usually does. °Date: ____________ Start time: ____________ Stop time: ____________ Movements: ____________ °Date: ____________ Start time: ____________ Stop time: ____________ Movements: ____________ °Date: ____________ Start time: ____________ Stop time: ____________ Movements: ____________ °Date: ____________ Start time:  ____________ Stop time: ____________ Movements: ____________ °Date: ____________ Start time: ____________ Stop time: ____________ Movements: ____________ °Date: ____________ Start time: ____________ Stop time: ____________ Movements: ____________ °Date: ____________ Start time: ____________ Stop time: ____________ Movements: ____________ °Date: ____________ Start time: ____________ Stop time: ____________ Movements: ____________ °Date: ____________ Start time: ____________ Stop time: ____________ Movements: ____________ °This information is not intended to replace advice given to you by your health care provider. Make sure you discuss any questions you have with your health care provider. °Document Released: 03/20/2006 Document Revised: 10/18/2015 Document Reviewed: 03/30/2015 °Elsevier Interactive Patient Education © 2019 [employer] ° °Safe Medications in Pregnancy  ° °Acne: °Benzoyl Peroxide °Salicylic Acid ° °Backache/Headache: °Tylenol: 2 regular strength every 4 hours OR °             2 Extra strength every 6 hours ° °Colds/Coughs/Allergies: °Benadryl (alcohol free) 25 mg every 6 hours as needed °Breath right strips °Claritin °Cepacol throat lozenges °Chloraseptic throat spray °Cold-Eeze- up to three times per day °Cough drops, alcohol free °Flonase (by prescription only) °Guaifenesin °Mucinex °Robitussin DM (plain only, alcohol free) °Saline nasal spray/drops °Sudafed (pseudoephedrine) & Actifed ** use only after [redacted] weeks gestation and if you do not have high blood pressure °Tylenol °Vicks Vaporub °Zinc lozenges °Zyrtec  ° °Constipation: °Colace °Ducolax suppositories °Fleet enema °Glycerin suppositories °Metamucil °Milk of magnesia °Miralax °Senokot °Smooth move tea ° °Diarrhea: °Kaopectate °Imodium A-D ° °*NO pepto Bismol ° °Hemorrhoids: °Anusol °Anusol HC °Preparation H °Tucks ° °Indigestion: °Tums °Maalox °Mylanta °Zantac  °Pepcid ° °Insomnia: °Benadryl (alcohol free) 25mg every 6 hours as    needed °Tylenol PM °Unisom, no Gelcaps ° °Leg Cramps: °Tums °MagGel ° °Nausea/Vomiting:  °Bonine °Dramamine °Emetrol °Ginger extract °Sea bands °Meclizine  °Nausea medication to take during pregnancy:  °Unisom (doxylamine succinate 25 mg tablets) Take one tablet daily at bedtime. If symptoms are not adequately controlled, the dose can be increased to a maximum recommended dose of two tablets daily (1/2 tablet in the morning, 1/2 tablet mid-afternoon and one at bedtime). °Vitamin B6 100mg tablets. Take one tablet twice a day (up to 200 mg per day). ° °Skin Rashes: °Aveeno products °Benadryl cream or 25mg every 6 hours as needed °Calamine Lotion °1% cortisone cream ° °Yeast infection: °Gyne-lotrimin 7 °Monistat 7 ° ° °**If taking multiple medications, please check labels to avoid duplicating the same active ingredients °**take medication as directed on the label °** Do not exceed 4000 mg of tylenol in 24 hours °**Do not take medications that contain aspirin or ibuprofen ° ° ° ° °

## 2018-03-03 ENCOUNTER — Telehealth: Payer: Self-pay

## 2018-03-03 NOTE — Telephone Encounter (Signed)
Patient called requesting refill on Flexeril, routed to provider. Attempted to call pt, no answer, vm is not set up.

## 2018-03-04 ENCOUNTER — Inpatient Hospital Stay (HOSPITAL_COMMUNITY)
Admission: AD | Admit: 2018-03-04 | Discharge: 2018-03-04 | Disposition: A | Discharge status: Home / Self Care | Payer: Medicaid Other | Source: Ambulatory Visit | Attending: Obstetrics and Gynecology | Admitting: Obstetrics and Gynecology

## 2018-03-04 DIAGNOSIS — O99513 Diseases of the respiratory system complicating pregnancy, third trimester: Secondary | ICD-10-CM | POA: Diagnosis not present

## 2018-03-04 DIAGNOSIS — O99333 Smoking (tobacco) complicating pregnancy, third trimester: Secondary | ICD-10-CM | POA: Diagnosis not present

## 2018-03-04 DIAGNOSIS — Z3A30 30 weeks gestation of pregnancy: Secondary | ICD-10-CM | POA: Insufficient documentation

## 2018-03-04 DIAGNOSIS — J069 Acute upper respiratory infection, unspecified: Secondary | ICD-10-CM | POA: Insufficient documentation

## 2018-03-04 DIAGNOSIS — O26893 Other specified pregnancy related conditions, third trimester: Secondary | ICD-10-CM

## 2018-03-04 DIAGNOSIS — F17210 Nicotine dependence, cigarettes, uncomplicated: Secondary | ICD-10-CM | POA: Diagnosis not present

## 2018-03-04 DIAGNOSIS — B9789 Other viral agents as the cause of diseases classified elsewhere: Secondary | ICD-10-CM | POA: Diagnosis not present

## 2018-03-04 DIAGNOSIS — R05 Cough: Secondary | ICD-10-CM | POA: Diagnosis present

## 2018-03-04 LAB — INFLUENZA PANEL BY PCR (TYPE A & B)
Influenza A By PCR: NEGATIVE
Influenza B By PCR: NEGATIVE

## 2018-03-04 MED ORDER — BENZONATATE 100 MG PO CAPS: 100.0000 mg | ORAL_CAPSULE | Freq: Three times a day (TID) | ORAL | 0 refills | Status: AC

## 2018-03-04 MED ORDER — CYCLOBENZAPRINE HCL 10 MG PO TABS: 10.0000 mg | ORAL_TABLET | Freq: Two times a day (BID) | ORAL | 0 refills | Status: DC | PRN

## 2018-03-04 NOTE — MAU Provider Note (Addendum)
History     CSN: [id number]  Arrival date and time: 03/04/18 1451   First Provider Initiated Contact with Patient 03/04/18 1635      Chief Complaint  Patient presents with  . Cough   HPI Katherine Barber is a 33 y.o. G2P0010 at [redacted]w[redacted]d who presents with a cough. She states she has had the cough for 2 months. It is dry and worse at night. She denies any other associated symptoms like fever, congestion, sore throat or headache. She is frustrated with the coughing. She states her step children have similar symptoms.  She denies any pain, vaginal bleeding or leaking of fluid. Reports normal fetal movement.   OB History    Gravida  2   Para      Term      Preterm      AB  1   Living        SAB  1   TAB      Ectopic      Multiple      Live Births              Past Medical History:  Diagnosis Date  . Anxiety   . UTI (urinary tract infection)     Past Surgical History:  Procedure Laterality Date  . KNEE ARTHROSCOPY    . NOSE SURGERY    . PALATAL EXPANSION    . WRIST SURGERY      Family History  Problem Relation Age of Onset  . Congestive Heart Failure Father     Social History   Tobacco Use  . Smoking status: Current Every Day Smoker    Packs/day: 1.00  . Smokeless tobacco: Never Used  Substance Use Topics  . Alcohol use: Yes    Comment: socially- not since pregnancy  . Drug use: No    Allergies: No Known Allergies  Medications Prior to Admission  Medication Sig Dispense Refill Last Dose  . acetaminophen (TYLENOL) 500 MG tablet Take 2 tablets (1,000 mg total) by mouth every 6 (six) hours as needed for moderate pain. (Patient not taking: Reported on 01/08/2018) 30 tablet 0 Not Taking  . cyclobenzaprine (FLEXERIL) 10 MG tablet Take 1 tablet (10 mg total) by mouth 2 (two) times daily as needed for muscle spasms. 20 tablet 0   . docusate sodium (COLACE) 100 MG capsule Take 1 capsule (100 mg total) by mouth every 12 (twelve) hours. (Patient not taking:  Reported on 01/08/2018) 60 capsule 0 Not Taking  . escitalopram (LEXAPRO) 10 MG tablet Take 10 mg by mouth daily.   Not Taking  . Misc. Devices MISC Dispense one maternity belt for patient (Patient not taking: Reported on 01/08/2018) 1 each 0 Not Taking  . polyethylene glycol (MIRALAX) packet Take 17 g by mouth daily. (Patient not taking: Reported on 01/08/2018) 14 each 0 Not Taking  . Prenatal Vit-Fe Fumarate-FA (MULTIVITAMIN-PRENATAL) 27-0.8 MG TABS tablet Take 1 tablet by mouth daily at 12 noon.   Taking  . trimethoprim-polymyxin b (POLYTRIM) ophthalmic solution Place 2 drops into the left eye every 6 (six) hours. (Patient not taking: Reported on 02/13/2018) 10 mL 0 Not Taking    Review of Systems  Constitutional: Negative.  Negative for fatigue and fever.  HENT: Negative.   Respiratory: Positive for cough. Negative for shortness of breath.   Cardiovascular: Negative.  Negative for chest pain.  Gastrointestinal: Negative.  Negative for abdominal pain, constipation, diarrhea, nausea and vomiting.  Genitourinary: Negative.  Negative for dysuria,  vaginal bleeding and vaginal discharge.  Neurological: Negative.  Negative for dizziness and headaches.   Physical Exam   Blood pressure 121/82, temperature 98.1 F (36.7 C), temperature source Oral, resp. rate 18, last menstrual period 08/01/2017.  Physical Exam  Nursing note and vitals reviewed. Constitutional: She is oriented to person, place, and time. She appears well-developed and well-nourished. No distress.  HENT:  Head: Normocephalic.  Eyes: Pupils are equal, round, and reactive to light.  Cardiovascular: Normal rate, regular rhythm and normal heart sounds.  Respiratory: Effort normal and breath sounds normal. No respiratory distress. She has no wheezes. She has no rales. She exhibits no tenderness.  GI: Soft. Bowel sounds are normal. She exhibits no distension. There is no abdominal tenderness.  Neurological: She is alert and oriented  to person, place, and time.  Skin: Skin is warm and dry.  Psychiatric: She has a normal mood and affect. Her behavior is normal. Judgment and thought content normal.   FHT: 145 bpm  MAU Course  Procedures Results for orders placed or performed during the hospital encounter of 03/04/18 (from the past 24 hour(s))  Influenza panel by PCR (type A & B)     Status: None   Collection Time: 03/04/18  3:22 PM  Result Value Ref Range   Influenza A By PCR NEGATIVE NEGATIVE   Influenza B By PCR NEGATIVE NEGATIVE   MDM Influenza PCR  Patient with no pregnancy complaints. VSS, afebrile, and length of symptoms cause low suspicion for any acute processes  Assessment and Plan   1. Viral URI with cough   2. [redacted] weeks gestation of pregnancy    -Discharge home in stable condition -Rx for tessalon perles given to patient -URI precautions discussed -Patient advised to follow-up with [name] as scheduled for prenatal care on Monday -Patient may return to MAU as needed or if her condition were to change or worsen   Rolm BookbinderCaroline M Coren Crownover CNM 03/04/2018, 4:35 PM

## 2018-03-04 NOTE — MAU Note (Signed)
Pt presents to MAU with co a cough shes had for two months. Pts states that the cough will feel like its going to get better then just gets worse. Pt states that its hard to sleep wo coughing.  Denies LOF,VB, or DC. +FM.

## 2018-03-04 NOTE — L&D Delivery Note (Addendum)
Delivery Note Katherine Barber is a 33 y.o. G2P0010 at [redacted]w[redacted]d admitted for SROM/SOL.  Labor course: uncomplicated ROM: 12h 15m with 0800, clear fluid  At 2022 a viable and healthy boy was delivered via spontaneous vaginal delivery (Presentation: cephalic; ROA  ).  Infant placed directly on mom's abdomen for bonding/skin-to-skin. Delayed cord clamping x , then cord clamped x 2, and cut by dad.  APGAR: 9,9 ; weight: pending at time of note.  40 units of pitocin diluted in 1000cc LR was infused rapidly IV per protocol. The placenta separated spontaneously and delivered via CCT and maternal pushing effort.  It was inspected and appears to be intact with a 3 VC.  Placenta/Cord with the following complications: none.   Intrapartum complications:  None Anesthesia:  epidural Episiotomy: none Lacerations:  1st degree; left labial (hemostatic, not repaired)  Suture Repair: 3.0 vicryl rapide Est. Blood Loss (mL): 268cc Sponge and instrument count were correct x2.  Mom to postpartum.  Baby to Couplet care / Skin to Skin. Placenta to L&D. Plans to bottlefeed Contraception: birth control pill Circ: outpatient  Brand Males, [facility] 05/06/2018 8:48 PM  OB FELLOW DELIVERY ATTESTATION  I was gloved and present for the delivery in its entirety, and I agree with the above resident's note.    Marcy Siren, D.O. OB Fellow  05/06/2018, 9:14 PM

## 2018-03-09 ENCOUNTER — Ambulatory Visit (INDEPENDENT_AMBULATORY_CARE_PROVIDER_SITE_OTHER): Payer: Medicaid Other | Admitting: Advanced Practice Midwife

## 2018-03-09 ENCOUNTER — Encounter: Payer: Self-pay | Admitting: Advanced Practice Midwife

## 2018-03-09 VITALS — BP 120/82 | HR 106 | Wt 144.0 lb

## 2018-03-09 DIAGNOSIS — Z3483 Encounter for supervision of other normal pregnancy, third trimester: Secondary | ICD-10-CM

## 2018-03-09 DIAGNOSIS — Z3480 Encounter for supervision of other normal pregnancy, unspecified trimester: Secondary | ICD-10-CM

## 2018-03-09 DIAGNOSIS — J069 Acute upper respiratory infection, unspecified: Secondary | ICD-10-CM

## 2018-03-09 DIAGNOSIS — J0110 Acute frontal sinusitis, unspecified: Secondary | ICD-10-CM

## 2018-03-09 DIAGNOSIS — B9789 Other viral agents as the cause of diseases classified elsewhere: Secondary | ICD-10-CM

## 2018-03-09 MED ORDER — AZITHROMYCIN 250 MG PO TABS: ORAL_TABLET | ORAL | 0 refills | Status: DC

## 2018-03-09 MED ORDER — ALBUTEROL SULFATE HFA 108 (90 BASE) MCG/ACT IN AERS: 2.0000 | INHALATION_SPRAY | Freq: Four times a day (QID) | RESPIRATORY_TRACT | 0 refills | Status: DC | PRN

## 2018-03-09 MED ORDER — GUAIFENESIN-CODEINE 100-10 MG/5ML PO SOLN: 5.0000 mL | Freq: Every evening | ORAL | 0 refills | Status: DC | PRN

## 2018-03-09 NOTE — Progress Notes (Signed)
   PRENATAL VISIT NOTE  Subjective:  Katherine Barber is a 33 y.o. G2P0010 at [redacted]w[redacted]d being seen today for ongoing prenatal care.  She is currently monitored for the following issues for this low-risk pregnancy and has Encounter for supervision of normal pregnancy, unspecified, unspecified trimester on their problem list.  Patient reports ongoing cough, sinus pressure, rhinorrhea, irritated throat, ears popping. No fever, no sore throat..  Contractions: Not present. Vag. Bleeding: None.  Movement: Present. Denies leaking of fluid.   The following portions of the patient's history were reviewed and updated as appropriate: allergies, current medications, past family history, past medical history, past social history, past surgical history and problem list. Problem list updated.  Objective:   Vitals:   03/09/18 1615  BP: 120/82  Pulse: (!) 106  Weight: 65.3 kg    Fetal Status: Fetal Heart Rate (bpm): 150 Fundal Height: 31 cm Movement: Present     Physical Examination: General appearance - alert, well appearing, and in no distress Ears - bilateral TM's and external ear canals normal Mouth - Tonsils and uvula with mild erythema. No edema or exudate.  HEART: normal rate, heart sounds, regular rhythm RESP: normal effort, lung sounds clear and equal bilaterally Musculoskeletal - full range of motion without pain  Assessment and Plan:  Pregnancy: G2P0010 at [redacted]w[redacted]d  1. Viral URI with cough --Was seen in [facility] 03/04/18 for symptoms. Influenza PCR negative.  Symptomatic treatment and Tessalon Perles for cough. --Illness x 2 months with cough keeping her awake.  Ear exam wnl but sinus pressure, hx of sinus infections when younger, prior to surgery for deviated septum. --Will treat for possible sinus infection with Zpak.  --Lung sounds clear but cough is significant so albuterol inhaler Q 6 hours PRN and cough syrup with codeine/guaifenesin Q HS PRN. --Return to care if fever, SOB, or worsening symptoms    2. Supervision of other normal pregnancy, antepartum --Anticipatory guidance about next visits/weeks of pregnancy given.   Preterm labor symptoms and general obstetric precautions including but not limited to vaginal bleeding, contractions, leaking of fluid and fetal movement were reviewed in detail with the patient. Please refer to After Visit Summary for other counseling recommendations.  Return in about 2 weeks (around 03/23/2018).  Future Appointments  Date Time Provider Department Center  03/23/2018 11:00 AM Leftwich-Kirby, Wilmer Floor, CNM [facility] None    Sharen Counter, CNM

## 2018-03-09 NOTE — Progress Notes (Signed)
Pt still has cold sx's no relief from Rx sent after [facility] visit.

## 2018-03-09 NOTE — Patient Instructions (Signed)

## 2018-03-23 ENCOUNTER — Ambulatory Visit (INDEPENDENT_AMBULATORY_CARE_PROVIDER_SITE_OTHER): Payer: Medicaid Other | Admitting: Advanced Practice Midwife

## 2018-03-23 VITALS — BP 114/86 | HR 94 | Wt 146.0 lb

## 2018-03-23 DIAGNOSIS — O99891 Other specified diseases and conditions complicating pregnancy: Secondary | ICD-10-CM

## 2018-03-23 DIAGNOSIS — J069 Acute upper respiratory infection, unspecified: Secondary | ICD-10-CM

## 2018-03-23 DIAGNOSIS — O9989 Other specified diseases and conditions complicating pregnancy, childbirth and the puerperium: Secondary | ICD-10-CM

## 2018-03-23 DIAGNOSIS — B9789 Other viral agents as the cause of diseases classified elsewhere: Secondary | ICD-10-CM

## 2018-03-23 DIAGNOSIS — Z3483 Encounter for supervision of other normal pregnancy, third trimester: Secondary | ICD-10-CM

## 2018-03-23 DIAGNOSIS — M549 Dorsalgia, unspecified: Secondary | ICD-10-CM

## 2018-03-23 DIAGNOSIS — R0781 Pleurodynia: Secondary | ICD-10-CM

## 2018-03-23 DIAGNOSIS — Z3480 Encounter for supervision of other normal pregnancy, unspecified trimester: Secondary | ICD-10-CM

## 2018-03-23 MED ORDER — GUAIFENESIN ER 600 MG PO TB12: 1200.0000 mg | ORAL_TABLET | Freq: Two times a day (BID) | ORAL | 0 refills | Status: DC

## 2018-03-23 MED ORDER — DEXTROMETHORPHAN HBR 15 MG/5ML PO SYRP: 10.0000 mL | ORAL_SOLUTION | Freq: Four times a day (QID) | ORAL | 0 refills | Status: DC | PRN

## 2018-03-23 MED ORDER — GUAIFENESIN-CODEINE 100-10 MG/5ML PO SOLN: 5.0000 mL | Freq: Every evening | ORAL | 0 refills | Status: DC | PRN

## 2018-03-23 NOTE — Patient Instructions (Addendum)
Third Trimester of Pregnancy The third trimester is from week 28 through week 40 (months 7 through 9). The third trimester is a time when the unborn baby (fetus) is growing rapidly. At the end of the ninth month, the fetus is about 20 inches in length and weighs 6-10 pounds. Body changes during your third trimester Your body will continue to go through many changes during pregnancy. The changes vary from woman to woman. During the third trimester:  Your weight will continue to increase. You can expect to gain 25-35 pounds (11-16 kg) by the end of the pregnancy.  You may begin to get stretch marks on your hips, abdomen, and breasts.  You may urinate more often because the fetus is moving lower into your pelvis and pressing on your bladder.  You may develop or continue to have heartburn. This is caused by increased hormones that slow down muscles in the digestive tract.  You may develop or continue to have constipation because increased hormones slow digestion and cause the muscles that push waste through your intestines to relax.  You may develop hemorrhoids. These are swollen veins (varicose veins) in the rectum that can itch or be painful.  You may develop swollen, bulging veins (varicose veins) in your legs.  You may have increased body aches in the pelvis, back, or thighs. This is due to weight gain and increased hormones that are relaxing your joints.  You may have changes in your hair. These can include thickening of your hair, rapid growth, and changes in texture. Some women also have hair loss during or after pregnancy, or hair that feels dry or thin. Your hair will most likely return to normal after your baby is born.  Your breasts will continue to grow and they will continue to become tender. A yellow fluid (colostrum) may leak from your breasts. This is the first milk you are producing for your baby.  Your belly button may stick out.  You may notice more swelling in your hands,  face, or ankles.  You may have increased tingling or numbness in your hands, arms, and legs. The skin on your belly may also feel numb.  You may feel short of breath because of your expanding uterus.  You may have more problems sleeping. This can be caused by the size of your belly, increased need to urinate, and an increase in your body's metabolism.  You may notice the fetus "dropping," or moving lower in your abdomen (lightening).  You may have increased vaginal discharge.  You may notice your joints feel loose and you may have pain around your pelvic bone. What to expect at prenatal visits You will have prenatal exams every 2 weeks until week 36. Then you will have weekly prenatal exams. During a routine prenatal visit:  You will be weighed to make sure you and the baby are growing normally.  Your blood pressure will be taken.  Your abdomen will be measured to track your baby's growth.  The fetal heartbeat will be listened to.  Any test results from the previous visit will be discussed.  You may have a cervical check near your due date to see if your cervix has softened or thinned (effaced).  You will be tested for Group B streptococcus. This happens between 35 and 37 weeks. Your health care provider may ask you:  What your birth plan is.  How you are feeling.  If you are feeling the baby move.  If you have had any abnormal   symptoms, such as leaking fluid, bleeding, severe headaches, or abdominal cramping.  If you are using any tobacco products, including cigarettes, chewing tobacco, and electronic cigarettes.  If you have any questions. Other tests or screenings that may be performed during your third trimester include:  Blood tests that check for low iron levels (anemia).  Fetal testing to check the health, activity level, and growth of the fetus. Testing is done if you have certain medical conditions or if there are problems during the pregnancy.  Nonstress test  (NST). This test checks the health of your baby to make sure there are no signs of problems, such as the baby not getting enough oxygen. During this test, a belt is placed around your belly. The baby is made to move, and its heart rate is monitored during movement. What is false labor? False labor is a condition in which you feel small, irregular tightenings of the muscles in the womb (contractions) that usually go away with rest, changing position, or drinking water. These are called Braxton Hicks contractions. Contractions may last for hours, days, or even weeks before true labor sets in. If contractions come at regular intervals, become more frequent, increase in intensity, or become painful, you should see your health care provider. What are the signs of labor?  Abdominal cramps.  Regular contractions that start at 10 minutes apart and become stronger and more frequent with time.  Contractions that start on the top of the uterus and spread down to the lower abdomen and back.  Increased pelvic pressure and dull back pain.  A watery or bloody mucus discharge that comes from the vagina.  Leaking of amniotic fluid. This is also known as your "water breaking." It could be a slow trickle or a gush. Let your health care provider know if it has a color or strange odor. If you have any of these signs, call your health care provider right away, even if it is before your due date. Follow these instructions at home: Medicines  Follow your health care provider's instructions regarding medicine use. Specific medicines may be either safe or unsafe to take during pregnancy.  Take a prenatal vitamin that contains at least 600 micrograms (mcg) of folic acid.  If you develop constipation, try taking a stool softener if your health care provider approves. Eating and drinking   Eat a balanced diet that includes fresh fruits and vegetables, whole grains, good sources of protein such as meat, eggs, or tofu,  and low-fat dairy. Your health care provider will help you determine the amount of weight gain that is right for you.  Avoid raw meat and uncooked cheese. These carry germs that can cause birth defects in the baby.  If you have low calcium intake from food, talk to your health care provider about whether you should take a daily calcium supplement.  Eat four or five small meals rather than three large meals a day.  Limit foods that are high in fat and processed sugars, such as fried and sweet foods.  To prevent constipation: ? Drink enough fluid to keep your urine clear or pale yellow. ? Eat foods that are high in fiber, such as fresh fruits and vegetables, whole grains, and beans. Activity  Exercise only as directed by your health care provider. Most women can continue their usual exercise routine during pregnancy. Try to exercise for 30 minutes at least 5 days a week. Stop exercising if you experience uterine contractions.  Avoid heavy lifting.  Do   not exercise in extreme heat or humidity, or at high altitudes.  Wear low-heel, comfortable shoes.  Practice good posture.  You may continue to have sex unless your health care provider tells you otherwise. Relieving pain and discomfort  Take frequent breaks and rest with your legs elevated if you have leg cramps or low back pain.  Take warm sitz baths to soothe any pain or discomfort caused by hemorrhoids. Use hemorrhoid cream if your health care provider approves.  Wear a good support bra to prevent discomfort from breast tenderness.  If you develop varicose veins: ? Wear support pantyhose or compression stockings as told by your healthcare provider. ? Elevate your feet for 15 minutes, 3-4 times a day. Prenatal care  Write down your questions. Take them to your prenatal visits.  Keep all your prenatal visits as told by your health care provider. This is important. Safety  Wear your seat belt at all times when driving.  Make  a list of emergency phone numbers, including numbers for family, friends, the hospital, and police and fire departments. General instructions  Avoid cat litter boxes and soil used by cats. These carry germs that can cause birth defects in the baby. If you have a cat, ask someone to clean the litter box for you.  Do not travel far distances unless it is absolutely necessary and only with the approval of your health care provider.  Do not use hot tubs, steam rooms, or saunas.  Do not drink alcohol.  Do not use any products that contain nicotine or tobacco, such as cigarettes and e-cigarettes. If you need help quitting, ask your health care provider.  Do not use any medicinal herbs or unprescribed drugs. These chemicals affect the formation and growth of the baby.  Do not douche or use tampons or scented sanitary pads.  Do not cross your legs for long periods of time.  To prepare for the arrival of your baby: ? Take prenatal classes to understand, practice, and ask questions about labor and delivery. ? Make a trial run to the hospital. ? Visit the hospital and tour the maternity area. ? Arrange for maternity or paternity leave through employers. ? Arrange for family and friends to take care of pets while you are in the hospital. ? Purchase a rear-facing car seat and make sure you know how to install it in your car. ? Pack your hospital bag. ? Prepare the baby's nursery. Make sure to remove all pillows and stuffed animals from the baby's crib to prevent suffocation.  Visit your dentist if you have not gone during your pregnancy. Use a soft toothbrush to brush your teeth and be gentle when you floss. Contact a health care provider if:  You are unsure if you are in labor or if your water has broken.  You become dizzy.  You have mild pelvic cramps, pelvic pressure, or nagging pain in your abdominal area.  You have lower back pain.  You have persistent nausea, vomiting, or  diarrhea.  You have an unusual or bad smelling vaginal discharge.  You have pain when you urinate. Get help right away if:  Your water breaks before 37 weeks.  You have regular contractions less than 5 minutes apart before 37 weeks.  You have a fever.  You are leaking fluid from your vagina.  You have spotting or bleeding from your vagina.  You have severe abdominal pain or cramping.  You have rapid weight loss or weight gain.  You have   shortness of breath with chest pain.  You notice sudden or extreme swelling of your face, hands, ankles, feet, or legs.  Your baby makes fewer than 10 movements in 2 hours.  You have severe headaches that do not go away when you take medicine.  You have vision changes. Summary  The third trimester is from week 28 through week 40, months 7 through 9. The third trimester is a time when the unborn baby (fetus) is growing rapidly.  During the third trimester, your discomfort may increase as you and your baby continue to gain weight. You may have abdominal, leg, and back pain, sleeping problems, and an increased need to urinate.  During the third trimester your breasts will keep growing and they will continue to become tender. A yellow fluid (colostrum) may leak from your breasts. This is the first milk you are producing for your baby.  False labor is a condition in which you feel small, irregular tightenings of the muscles in the womb (contractions) that eventually go away. These are called Braxton Hicks contractions. Contractions may last for hours, days, or even weeks before true labor sets in.  Signs of labor can include: abdominal cramps; regular contractions that start at 10 minutes apart and become stronger and more frequent with time; watery or bloody mucus discharge that comes from the vagina; increased pelvic pressure and dull back pain; and leaking of amniotic fluid. This information is not intended to replace advice given to you by your  health care provider. Make sure you discuss any questions you have with your health care provider. Document Released: 02/12/2001 Document Revised: 03/26/2016 Document Reviewed: 03/26/2016 Elsevier Interactive Patient Education  2019 [employer]   Viral Respiratory Infection A respiratory infection is an illness that affects part of the respiratory system, such as the lungs, nose, or throat. A respiratory infection that is caused by a virus is called a viral respiratory infection. Common types of viral respiratory infections include:  A cold.  The flu (influenza).  A respiratory syncytial virus (RSV) infection. What are the causes? This condition is caused by a virus. What are the signs or symptoms? Symptoms of this condition include:  A stuffy or runny nose.  Yellow or green nasal discharge.  A cough.  Sneezing.  Fatigue.  Achy muscles.  A sore throat.  Sweating or chills.  A fever.  A headache. How is this diagnosed? This condition may be diagnosed based on:  Your symptoms.  A physical exam.  Testing of nasal swabs. How is this treated? This condition may be treated with medicines, such as:  Antiviral medicine. This may shorten the length of time a person has symptoms.  Expectorants. These make it easier to cough up mucus.  Decongestant nasal sprays.  Acetaminophen or NSAIDs to relieve fever and pain. Antibiotic medicines are not prescribed for viral infections. This is because antibiotics are designed to kill bacteria. They are not effective against viruses. Follow these instructions at home:  Managing pain and congestion  Take over-the-counter and prescription medicines only as told by your health care provider.  If you have a sore throat, gargle with a salt-water mixture 3-4 times a day or as needed. To make a salt-water mixture, completely dissolve -1 tsp of salt in 1 cup of warm water.  Use nose drops made from salt water to ease congestion  and soften raw skin around your nose.  Drink enough fluid to keep your urine pale yellow. This helps prevent dehydration and helps loosen  up mucus. General instructions  Rest as much as possible.  Do not drink alcohol.  Do not use any products that contain nicotine or tobacco, such as cigarettes and e-cigarettes. If you need help quitting, ask your health care provider.  Keep all follow-up visits as told by your health care provider. This is important. How is this prevented?   Get an annual flu shot. You may get the flu shot in late summer, fall, or winter. Ask your health care provider when you should get your flu shot.  Avoid exposing others to your respiratory infection. ? Stay home from work or school as told by your health care provider. ? Wash your hands with soap and water often, especially after you cough or sneeze. If soap and water are not available, use alcohol-based hand sanitizer.  Avoid contact with people who are sick during cold and flu season. This is generally fall and winter. Contact a health care provider if:  Your symptoms last for 10 days or longer.  Your symptoms get worse over time.  You have a fever.  You have severe sinus pain in your face or forehead.  The glands in your jaw or neck become very swollen. Get help right away if you:  Feel pain or pressure in your chest.  Have shortness of breath.  Faint or feel like you will faint.  Have severe and persistent vomiting.  Feel confused or disoriented. Summary  A respiratory infection is an illness that affects part of the respiratory system, such as the lungs, nose, or throat. A respiratory infection that is caused by a virus is called a viral respiratory infection.  Common types of viral respiratory infections are a cold, influenza, and respiratory syncytial virus (RSV) infection.  Symptoms of this condition include a stuffy or runny nose, cough, sneezing, fatigue, achy muscles, sore throat,  and fevers or chills.  Antibiotic medicines are not prescribed for viral infections. This is because antibiotics are designed to kill bacteria. They are not effective against viruses. This information is not intended to replace advice given to you by your health care provider. Make sure you discuss any questions you have with your health care provider. Document Released: 11/28/2004 Document Revised: 03/31/2017 Document Reviewed: 03/31/2017 Elsevier Interactive Patient Education  2019 [employer].

## 2018-03-23 NOTE — Progress Notes (Signed)
   PRENATAL VISIT NOTE  Subjective:  Katherine Barber is a 33 y.o. G2P0010 at [redacted]w[redacted]d being seen today for ongoing prenatal care.  She is currently monitored for the following issues for this low-risk pregnancy and has Encounter for supervision of normal pregnancy, unspecified, unspecified trimester on their problem list.  Patient reports cough, congestion continue, improved since last visit.  Contractions: Not present. Vag. Bleeding: None.  Movement: Present. Denies leaking of fluid.   The following portions of the patient's history were reviewed and updated as appropriate: allergies, current medications, past family history, past medical history, past social history, past surgical history and problem list. Problem list updated.  Objective:   Vitals:   03/23/18 1059  BP: 114/86  Pulse: 94  Weight: 66.2 kg    Fetal Status: Fetal Heart Rate (bpm): 145   Movement: Present     General:  Alert, oriented and cooperative. Patient is in no acute distress.  Skin: Skin is warm and dry. No rash noted.   Cardiovascular: Normal heart rate noted  Respiratory: Normal respiratory effort, no problems with respiration noted  Abdomen: Soft, gravid, appropriate for gestational age.  Pain/Pressure: Present     Pelvic: Cervical exam deferred        Extremities: Normal range of motion.     Mental Status: Normal mood and affect. Normal behavior. Normal judgment and thought content.   Assessment and Plan:  Pregnancy: G2P0010 at [redacted]w[redacted]d  1. Supervision of other normal pregnancy, antepartum --Anticipatory guidance about next visits/weeks of pregnancy given.  2. Back pain complicating pregnancy in second trimester --Pt waits tables.  Usually Ok but sometimes pain in left hip, left lower back after work.  Has support belt but it is hard to move/bend with it on so doesn't always wear it. --Discussed options to buy smaller support belt to try for comfort.    3. URI with cough and congestion --Continue inhaler,  increased PO fluids.  Cough has been dry so will add expectorant. Cough suppressant during the day at work.  - guaiFENesin (MUCINEX) 600 MG 12 hr tablet; Take 2 tablets (1,200 mg total) by mouth 2 (two) times daily.  Dispense: 12 tablet; Refill: 0 - dextromethorphan 15 MG/5ML syrup; Take 10 mLs (30 mg total) by mouth 4 (four) times daily as needed for cough.  Dispense: 120 mL; Refill: 0  4. Viral URI with cough --Renew Rx for cough syrup with codeine for PRN use at night.  This does help so pt doesn't wake up coughing every hour. - guaiFENesin-codeine 100-10 MG/5ML syrup; Take 5-10 mLs by mouth at bedtime as needed for cough.  Dispense: 120 mL; Refill: 0  5. Rib pain on right side --From coughing.  Rest/ice/heat/warm bath.  See above for solutions for coughing.    Preterm labor symptoms and general obstetric precautions including but not limited to vaginal bleeding, contractions, leaking of fluid and fetal movement were reviewed in detail with the patient. Please refer to After Visit Summary for other counseling recommendations.  Return in about 2 weeks (around 04/06/2018).  Future Appointments  Date Time Provider Department Center  04/06/2018 10:15 AM Leftwich-Kirby, Wilmer Floor, CNM [facility] None    Sharen Counter, CNM

## 2018-03-24 ENCOUNTER — Other Ambulatory Visit: Payer: Self-pay | Admitting: Obstetrics and Gynecology

## 2018-03-26 ENCOUNTER — Telehealth: Payer: Self-pay

## 2018-03-26 NOTE — Telephone Encounter (Signed)
Routed pt request for Flexeril refill to provider

## 2018-03-29 ENCOUNTER — Other Ambulatory Visit: Payer: Self-pay | Admitting: Advanced Practice Midwife

## 2018-03-29 MED ORDER — CYCLOBENZAPRINE HCL 10 MG PO TABS: 5.0000 mg | ORAL_TABLET | Freq: Three times a day (TID) | ORAL | 0 refills | Status: DC | PRN

## 2018-04-06 ENCOUNTER — Ambulatory Visit (INDEPENDENT_AMBULATORY_CARE_PROVIDER_SITE_OTHER): Payer: Medicaid Other | Admitting: Advanced Practice Midwife

## 2018-04-06 VITALS — BP 124/88 | HR 93 | Wt 149.0 lb

## 2018-04-06 DIAGNOSIS — R102 Pelvic and perineal pain: Secondary | ICD-10-CM

## 2018-04-06 DIAGNOSIS — Z3483 Encounter for supervision of other normal pregnancy, third trimester: Secondary | ICD-10-CM

## 2018-04-06 DIAGNOSIS — O26893 Other specified pregnancy related conditions, third trimester: Secondary | ICD-10-CM

## 2018-04-06 DIAGNOSIS — Z3480 Encounter for supervision of other normal pregnancy, unspecified trimester: Secondary | ICD-10-CM

## 2018-04-06 NOTE — Progress Notes (Signed)
   PRENATAL VISIT NOTE  Subjective:  Katherine Barber is a 33 y.o. G2P0010 at 5028w3d being seen today for ongoing prenatal care.  She is currently monitored for the following issues for this low-risk pregnancy and has Encounter for supervision of normal pregnancy, unspecified, unspecified trimester on their problem list.  Patient reports backache and pelvic pain while at work.  Contractions: Not present. Vag. Bleeding: None.  Movement: Present. Denies leaking of fluid.   The following portions of the patient's history were reviewed and updated as appropriate: allergies, current medications, past family history, past medical history, past social history, past surgical history and problem list. Problem list updated.  Objective:   Vitals:   04/06/18 1023  BP: 124/88  Pulse: 93  Weight: 67.6 kg    Fetal Status: Fetal Heart Rate (bpm): 152   Movement: Present     General:  Alert, oriented and cooperative. Patient is in no acute distress.  Skin: Skin is warm and dry. No rash noted.   Cardiovascular: Normal heart rate noted  Respiratory: Normal respiratory effort, no problems with respiration noted  Abdomen: Soft, gravid, appropriate for gestational age.  Pain/Pressure: Present     Pelvic: Cervical exam deferred        Extremities: Normal range of motion.  Edema: None  Mental Status: Normal mood and affect. Normal behavior. Normal judgment and thought content.   Assessment and Plan:  Pregnancy: G2P0010 at 4228w3d  1. Supervision of other normal pregnancy, antepartum --Anticipatory guidance about next visits/weeks of pregnancy given.  2. 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  --Pt to purchase smaller more comfortable pregnancy support online to try at work. --Increase PO fluids.  Preterm labor symptoms and general  obstetric precautions including but not limited to vaginal bleeding, contractions, leaking of fluid and fetal movement were reviewed in detail with the patient. Please refer to After Visit Summary for other counseling recommendations.  Return in about 2 weeks (around 04/20/2018).  No future appointments.  Sharen CounterLisa Leftwich-Kirby, CNM

## 2018-04-06 NOTE — Patient Instructions (Addendum)
Try the Prenatal Cradle Mini Cradle for pregnancy support.   Third Trimester of Pregnancy  The third trimester is from week 28 through week 40 (months 7 through 9). This trimester is when your unborn baby (fetus) is growing very fast. At the end of the ninth month, the unborn baby is about 20 inches in length. It weighs about 6-10 pounds. Follow these instructions at home: Medicines  Take over-the-counter and prescription medicines only as told by your doctor. Some medicines are safe and some medicines are not safe during pregnancy.  Take a prenatal vitamin that contains at least 600 micrograms (mcg) of folic acid.  If you have trouble pooping (constipation), take medicine that will make your stool soft (stool softener) if your doctor approves. Eating and drinking   Eat regular, healthy meals.  Avoid raw meat and uncooked cheese.  If you get low calcium from the food you eat, talk to your doctor about taking a daily calcium supplement.  Eat four or five small meals rather than three large meals a day.  Avoid foods that are high in fat and sugars, such as fried and sweet foods.  To prevent constipation: ? Eat foods that are high in fiber, like fresh fruits and vegetables, whole grains, and beans. ? Drink enough fluids to keep your pee (urine) clear or pale yellow. Activity  Exercise only as told by your doctor. Stop exercising if you start to have cramps.  Avoid heavy lifting, wear low heels, and sit up straight.  Do not exercise if it is too hot, too humid, or if you are in a place of great height (high altitude).  You may continue to have sex unless your doctor tells you not to. Relieving pain and discomfort  Wear a good support bra if your breasts are tender.  Take frequent breaks and rest with your legs raised if you have leg cramps or low back pain.  Take warm water baths (sitz baths) to soothe pain or discomfort caused by hemorrhoids. Use hemorrhoid cream if your  doctor approves.  If you develop puffy, bulging veins (varicose veins) in your legs: ? Wear support hose or compression stockings as told by your doctor. ? Raise (elevate) your feet for 15 minutes, 3-4 times a day. ? Limit salt in your food. Safety  Wear your seat belt when driving.  Make a list of emergency phone numbers, including numbers for family, friends, the hospital, and police and fire departments. Preparing for your baby's arrival To prepare for the arrival of your baby:  Take prenatal classes.  Practice driving to the hospital.  Visit the hospital and tour the maternity area.  Talk to your work about taking leave once the baby comes.  Pack your hospital bag.  Prepare the baby's room.  Go to your doctor visits.  Buy a rear-facing car seat. Learn how to install it in your car. General instructions  Do not use hot tubs, steam rooms, or saunas.  Do not use any products that contain nicotine or tobacco, such as cigarettes and e-cigarettes. If you need help quitting, ask your doctor.  Do not drink alcohol.  Do not douche or use tampons or scented sanitary pads.  Do not cross your legs for long periods of time.  Do not travel for long distances unless you must. Only do so if your doctor says it is okay.  Visit your dentist if you have not gone during your pregnancy. Use a soft toothbrush to brush your teeth. Be  gentle when you floss.  Avoid cat litter boxes and soil used by cats. These carry germs that can cause birth defects in the baby and can cause a loss of your baby (miscarriage) or stillbirth.  Keep all your prenatal visits as told by your doctor. This is important. Contact a doctor if:  You are not sure if you are in labor or if your water has broken.  You are dizzy.  You have mild cramps or pressure in your lower belly.  You have a nagging pain in your belly area.  You continue to feel sick to your stomach, you throw up, or you have watery  poop.  You have bad smelling fluid coming from your vagina.  You have pain when you pee. Get help right away if:  You have a fever.  You are leaking fluid from your vagina.  You are spotting or bleeding from your vagina.  You have severe belly cramps or pain.  You lose or gain weight quickly.  You have trouble catching your breath and have chest pain.  You notice sudden or extreme puffiness (swelling) of your face, hands, ankles, feet, or legs.  You have not felt the baby move in over an hour.  You have severe headaches that do not go away with medicine.  You have trouble seeing.  You are leaking, or you are having a gush of fluid, from your vagina before you are 37 weeks.  You have regular belly spasms (contractions) before you are 37 weeks. Summary  The third trimester is from week 28 through week 40 (months 7 through 9). This time is when your unborn baby is growing very fast.  Follow your doctor's advice about medicine, food, and activity.  Get ready for the arrival of your baby by taking prenatal classes, getting all the baby items ready, preparing the baby's room, and visiting your doctor to be checked.  Get help right away if you are bleeding from your vagina, or you have chest pain and trouble catching your breath, or if you have not felt your baby move in over an hour. This information is not intended to replace advice given to you by your health care provider. Make sure you discuss any questions you have with your health care provider. Document Released: 05/15/2009 Document Revised: 03/26/2016 Document Reviewed: 03/26/2016 Elsevier Interactive Patient Education  2019 [employer].

## 2018-04-20 ENCOUNTER — Ambulatory Visit (INDEPENDENT_AMBULATORY_CARE_PROVIDER_SITE_OTHER): Payer: Medicaid Other | Admitting: Obstetrics and Gynecology

## 2018-04-20 ENCOUNTER — Encounter: Payer: Self-pay | Admitting: Obstetrics and Gynecology

## 2018-04-20 ENCOUNTER — Other Ambulatory Visit (HOSPITAL_COMMUNITY)
Admission: RE | Admit: 2018-04-20 | Discharge: 2018-04-20 | Disposition: A | Discharge status: Home / Self Care | Payer: Medicaid Other | Source: Ambulatory Visit | Attending: Obstetrics and Gynecology | Admitting: Obstetrics and Gynecology

## 2018-04-20 VITALS — BP 130/84 | HR 101 | Wt 152.0 lb

## 2018-04-20 DIAGNOSIS — Z3483 Encounter for supervision of other normal pregnancy, third trimester: Secondary | ICD-10-CM

## 2018-04-20 DIAGNOSIS — Z3A37 37 weeks gestation of pregnancy: Secondary | ICD-10-CM

## 2018-04-20 DIAGNOSIS — Z3480 Encounter for supervision of other normal pregnancy, unspecified trimester: Secondary | ICD-10-CM | POA: Insufficient documentation

## 2018-04-20 LAB — OB RESULTS CONSOLE GBS: GBS: NEGATIVE

## 2018-04-20 MED ORDER — COMFORT FIT MATERNITY SUPP LG MISC: 0 refills | Status: DC

## 2018-04-20 MED ORDER — CYCLOBENZAPRINE HCL 10 MG PO TABS: 10.0000 mg | ORAL_TABLET | Freq: Three times a day (TID) | ORAL | 2 refills | Status: DC | PRN

## 2018-04-20 NOTE — Addendum Note (Signed)
Addended by: Catalina Antigua on: 04/20/2018 11:30 AM   Modules accepted: Orders

## 2018-04-20 NOTE — Progress Notes (Signed)
Pt c/o increase in swelling.  Pt wants to discuss work restrictions states current job she is on her feet, 8hrs no breaks.  Pt request cervix check due to increased pressure.

## 2018-04-20 NOTE — Progress Notes (Signed)
   PRENATAL VISIT NOTE  Subjective:  Katherine Barber is a 33 y.o. G2P0010 at [redacted]w[redacted]d being seen today for ongoing prenatal care.  She is currently monitored for the following issues for this low-risk pregnancy and has Encounter for supervision of normal pregnancy, unspecified, unspecified trimester on their problem list.  Patient reports back pain.  Contractions: Not present. Vag. Bleeding: None.  Movement: Present. Denies leaking of fluid.   The following portions of the patient's history were reviewed and updated as appropriate: allergies, current medications, past family history, past medical history, past social history, past surgical history and problem list. Problem list updated.  Objective:   Vitals:   04/20/18 1039  BP: 130/84  Pulse: (!) 101  Weight: 152 lb (68.9 kg)    Fetal Status: Fetal Heart Rate (bpm): 140   Movement: Present  Presentation: Vertex  General:  Alert, oriented and cooperative. Patient is in no acute distress.  Skin: Skin is warm and dry. No rash noted.   Cardiovascular: Normal heart rate noted  Respiratory: Normal respiratory effort, no problems with respiration noted  Abdomen: Soft, gravid, appropriate for gestational age.  Pain/Pressure: Present     Pelvic: Cervical exam performed Dilation: Closed Effacement (%): Thick Station: Ballotable  Extremities: Normal range of motion.  Edema: Mild pitting, slight indentation  Mental Status: Normal mood and affect. Normal behavior. Normal judgment and thought content.   Assessment and Plan:  Pregnancy: G2P0010 at [redacted]w[redacted]d  1. Supervision of other normal pregnancy, antepartum Patient is doing well with persistent back pain Rx Flexeril and maternity support belt provided Cultures today - Culture, beta strep (group b only) - Cervicovaginal ancillary only( [place])  Term labor symptoms and general obstetric precautions including but not limited to vaginal bleeding, contractions, leaking of fluid and fetal  movement were reviewed in detail with the patient. Please refer to After Visit Summary for other counseling recommendations.  Return in about 1 week (around 04/27/2018) for ROB.  No future appointments.  Catalina Antigua, MD

## 2018-04-21 LAB — CERVICOVAGINAL ANCILLARY ONLY
Chlamydia: NEGATIVE
NEISSERIA GONORRHEA: NEGATIVE

## 2018-04-24 LAB — CULTURE, BETA STREP (GROUP B ONLY): STREP GP B CULTURE: NEGATIVE

## 2018-04-25 ENCOUNTER — Inpatient Hospital Stay (HOSPITAL_COMMUNITY)
Admission: AD | Admit: 2018-04-25 | Discharge: 2018-04-25 | Disposition: A | Discharge status: Home / Self Care | Payer: Medicaid Other | Attending: Obstetrics & Gynecology | Admitting: Obstetrics & Gynecology

## 2018-04-25 ENCOUNTER — Encounter (HOSPITAL_COMMUNITY): Payer: Self-pay | Admitting: *Deleted

## 2018-04-25 ENCOUNTER — Inpatient Hospital Stay (HOSPITAL_COMMUNITY): Payer: Medicaid Other

## 2018-04-25 DIAGNOSIS — O99333 Smoking (tobacco) complicating pregnancy, third trimester: Secondary | ICD-10-CM | POA: Insufficient documentation

## 2018-04-25 DIAGNOSIS — F17210 Nicotine dependence, cigarettes, uncomplicated: Secondary | ICD-10-CM | POA: Diagnosis not present

## 2018-04-25 DIAGNOSIS — M940 Chondrocostal junction syndrome [Tietze]: Secondary | ICD-10-CM | POA: Diagnosis not present

## 2018-04-25 DIAGNOSIS — R0781 Pleurodynia: Secondary | ICD-10-CM | POA: Insufficient documentation

## 2018-04-25 DIAGNOSIS — Z3A38 38 weeks gestation of pregnancy: Secondary | ICD-10-CM | POA: Diagnosis not present

## 2018-04-25 DIAGNOSIS — O26893 Other specified pregnancy related conditions, third trimester: Secondary | ICD-10-CM | POA: Diagnosis not present

## 2018-04-25 DIAGNOSIS — R05 Cough: Secondary | ICD-10-CM | POA: Diagnosis not present

## 2018-04-25 DIAGNOSIS — R0789 Other chest pain: Secondary | ICD-10-CM | POA: Diagnosis present

## 2018-04-25 MED ORDER — OXYCODONE-ACETAMINOPHEN 5-325 MG PO TABS: 1.0000 | ORAL_TABLET | ORAL | 0 refills | Status: DC | PRN

## 2018-04-25 NOTE — MAU Note (Signed)
PT SAYS HAS HAD A COUGH X 2 MTHS-   AND WHILE  SHE DID - HAD RIGHT SIDED RIB PAIN.- STOPPED  2 WEEKS AGO.  WAS ON Z-PACK- PRODUCTIVE COUGH.    SAYS LEFT SIDED RIB PAIN STARTED ON WED-  WHILE  SHE HAS SOME MILD  COUGHING .- PAIN HURTS ALL THE  TIME  .

## 2018-04-25 NOTE — MAU Provider Note (Signed)
History     CSN: [id number]  Arrival date and time: 04/25/18 9147   First Provider Initiated Contact with Patient 04/25/18 2043      Chief Complaint  Patient presents with  . Chest Pain   HPI   Katherine Barber is a 33 y.o. female G2P0010 @ [redacted]w[redacted]d here with left sided rib pain. She had a terrible cough for more than a month due to a respiratory virus. She had a dry hacking cough for 1 month and then it stopped. On Wednesday she started feeling this left sided rib pain that is worse when she sneezes, changed positions or when she coughs. She still has a little bit of a lingering cough but she avoids it do to the pain in her left rib gage. She can reproduce pain on her left rib cage if she presses on the area.  She took a flexeril 10 mg PO and tylenol 1 gram this morning at 10:00 which did not help. The only thing that helps is when she is laying on a heating pad. No shortness of breath, no chest pain, only chest wall pain/muscular pain. She can pinpoint where the pain is.    OB History    Gravida  2   Para      Term      Preterm      AB  1   Living        SAB  1   TAB      Ectopic      Multiple      Live Births              Past Medical History:  Diagnosis Date  . Anxiety   . UTI (urinary tract infection)     Past Surgical History:  Procedure Laterality Date  . KNEE ARTHROSCOPY    . NOSE SURGERY    . PALATAL EXPANSION    . WRIST SURGERY      Family History  Problem Relation Age of Onset  . Congestive Heart Failure Father     Social History   Tobacco Use  . Smoking status: Current Every Day Smoker    Packs/day: 1.00  . Smokeless tobacco: Never Used  Substance Use Topics  . Alcohol use: Yes    Comment: socially- not since pregnancy  . Drug use: No    Allergies: No Known Allergies  Medications Prior to Admission  Medication Sig Dispense Refill Last Dose  . albuterol (PROVENTIL HFA;VENTOLIN HFA) 108 (90 Base) MCG/ACT inhaler Inhale 2 puffs  into the lungs every 6 (six) hours as needed for wheezing or shortness of breath. 1 Inhaler 0 Past Month at Unknown time  . azithromycin (ZITHROMAX) 250 MG tablet Take 2 tablets on Day 1, then 1 tablet daily on Days 2, 3, 4, and 5. 6 tablet 0 Past Month at Unknown time  . cyclobenzaprine (FLEXERIL) 10 MG tablet Take 0.5-1 tablets (5-10 mg total) by mouth 3 (three) times daily as needed for muscle spasms. 20 tablet 0 04/25/2018 at Unknown time  . guaiFENesin-codeine 100-10 MG/5ML syrup Take 5-10 mLs by mouth at bedtime as needed for cough. 120 mL 0 Past Month at Unknown time  . Prenatal Vit-Fe Fumarate-FA (MULTIVITAMIN-PRENATAL) 27-0.8 MG TABS tablet Take 1 tablet by mouth daily at 12 noon.   04/24/2018 at Unknown time  . acetaminophen (TYLENOL) 500 MG tablet Take 2 tablets (1,000 mg total) by mouth every 6 (six) hours as needed for moderate pain. (Patient not taking:  Reported on 01/08/2018) 30 tablet 0 Not Taking  . cyclobenzaprine (FLEXERIL) 10 MG tablet Take 1 tablet (10 mg total) by mouth 3 (three) times daily as needed for muscle spasms. 30 tablet 2   . dextromethorphan 15 MG/5ML syrup Take 10 mLs (30 mg total) by mouth 4 (four) times daily as needed for cough. (Patient not taking: Reported on 04/20/2018) 120 mL 0 Not Taking  . docusate sodium (COLACE) 100 MG capsule Take 1 capsule (100 mg total) by mouth every 12 (twelve) hours. (Patient not taking: Reported on 01/08/2018) 60 capsule 0 Not Taking  . Elastic Bandages & Supports (COMFORT FIT MATERNITY SUPP LG) MISC Wear daily when ambulating 1 each 0   . Elastic Bandages & Supports (COMFORT FIT MATERNITY SUPP LG) MISC Wear daily when ambulating 1 each 0   . escitalopram (LEXAPRO) 10 MG tablet Take 10 mg by mouth daily.   More than a month at Unknown time  . guaiFENesin (MUCINEX) 600 MG 12 hr tablet Take 2 tablets (1,200 mg total) by mouth 2 (two) times daily. (Patient not taking: Reported on 04/20/2018) 12 tablet 0 Not Taking  . Misc. Devices MISC Dispense  one maternity belt for patient (Patient not taking: Reported on 01/08/2018) 1 each 0 Not Taking  . polyethylene glycol (MIRALAX) packet Take 17 g by mouth daily. (Patient not taking: Reported on 01/08/2018) 14 each 0 Not Taking  . trimethoprim-polymyxin b (POLYTRIM) ophthalmic solution Place 2 drops into the left eye every 6 (six) hours. (Patient not taking: Reported on 02/13/2018) 10 mL 0 Not Taking   No results found for this or any previous visit (from the past 48 hour(s)).   Dg Chest 2 View  Result Date: 04/25/2018 CLINICAL DATA:  Cough, left side rib pain EXAM: CHEST - 2 VIEW COMPARISON:  03/05/2017 FINDINGS: Heart and mediastinal contours are within normal limits. No focal opacities or effusions. No acute bony abnormality. IMPRESSION: No active cardiopulmonary disease. Electronically Signed   By: Charlett Nose M.D.   On: 04/25/2018 21:39   Review of Systems  Respiratory: Negative for shortness of breath and wheezing.   Cardiovascular: Positive for chest pain (Left chest wall pain ).   Physical Exam   Blood pressure 126/82, pulse 73, temperature 98 F (36.7 C), resp. rate 18, height 5\' 3"  (1.6 m), weight 68.5 kg, last menstrual period 08/01/2017, SpO2 100 %.  Physical Exam  Constitutional: She is oriented to person, place, and time. She appears well-developed and well-nourished. No distress.  Respiratory: Effort normal and breath sounds normal.    Tenderness below the L rib cage near the costal arch with palpation.   Genitourinary:    Genitourinary Comments: Dilation: Closed Cervical Position: Anterior Exam by:: JENNIFER, CNM   Musculoskeletal: Normal range of motion.  Neurological: She is alert and oriented to person, place, and time.  Skin: Skin is warm. She is not diaphoretic.  Psychiatric: Her behavior is normal.   Fetal Tracing: Baseline: 130 bpm Variability: Moderate  Accelerations: 15x15 Decelerations: None Toco: Irregular pattern.   MAU Course   Procedures  MDM  Chest wall pain X 4 days, unlikely PE due to the timeframe of pain and patient is without SOB.  Pulse ox 96-99 % on RA Patient drove herself here to the hospital so I was unable to give her anything stronger for pain.  Chest xray normal   Assessment and Plan   1. Costochondritis, acute   2. Rib pain on left side   3. [redacted] weeks  gestation of pregnancy     P:  Discharge home with strict return precautions  Rx: Percocet PRN mainly to help with pain at night Continue OTC tylenol as directed on the bottle Continue heat therapy. Keep your OB appointment on Monday If symptoms worsen return to MAU  Rasch, Harolyn Rutherford, NP 04/25/2018 10:56 PM

## 2018-04-25 NOTE — Discharge Instructions (Signed)
Costochondritis    Costochondritis is swelling and irritation (inflammation) of the tissue (cartilage) that connects your ribs to your breastbone (sternum). This causes pain in the front of your chest. The pain usually starts gradually and involves more than one rib.  What are the causes?  The exact cause of this condition is not always known. It results from stress on the cartilage where your ribs attach to your sternum. The cause of this stress could be:   Chest injury (trauma).   Exercise or activity, such as lifting.   Severe coughing.  What increases the risk?  You may be at higher risk for this condition if you:   Are female.   Are 30?31 years old.   Recently started a new exercise or work activity.   Have low levels of vitamin D.   Have a condition that makes you cough frequently.  What are the signs or symptoms?  The main symptom of this condition is chest pain. The pain:   Usually starts gradually and can be sharp or dull.   Gets worse with deep breathing, coughing, or exercise.   Gets better with rest.   May be worse when you press on the sternum-rib connection (tenderness).  How is this diagnosed?  This condition is diagnosed based on your symptoms, medical history, and a physical exam. Your health care provider will check for tenderness when pressing on your sternum. This is the most important finding. You may also have tests to rule out other causes of chest pain. These may include:   A chest X-ray to check for lung problems.   An electrocardiogram (ECG) to see if you have a heart problem that could be causing the pain.   An imaging scan to rule out a chest or rib fracture.  How is this treated?  This condition usually goes away on its own over time. Your health care provider may prescribe an NSAID to reduce pain and inflammation. Your health care provider may also suggest that you:   Rest and avoid activities that make pain worse.   Apply heat or cold to the area to reduce pain and  inflammation.   Do exercises to stretch your chest muscles.  If these treatments do not help, your health care provider may inject a numbing medicine at the sternum-rib connection to help relieve the pain.  Follow these instructions at home:   Avoid activities that make pain worse. This includes any activities that use chest, abdominal, and side muscles.   If directed, put ice on the painful area:  ? Put ice in a plastic bag.  ? Place a towel between your skin and the bag.  ? Leave the ice on for 20 minutes, 2-3 times a day.   If directed, apply heat to the affected area as often as told by your health care provider. Use the heat source that your health care provider recommends, such as a moist heat pack or a heating pad.  ? Place a towel between your skin and the heat source.  ? Leave the heat on for 20-30 minutes.  ? Remove the heat if your skin turns bright red. This is especially important if you are unable to feel pain, heat, or cold. You may have a greater risk of getting burned.   Take over-the-counter and prescription medicines only as told by your health care provider.   Return to your normal activities as told by your health care provider. Ask your health care provider what   activities are safe for you.   Keep all follow-up visits as told by your health care provider. This is important.  Contact a health care provider if:   You have chills or a fever.   Your pain does not go away or it gets worse.   You have a cough that does not go away (is persistent).  Get help right away if:   You have shortness of breath.  This information is not intended to replace advice given to you by your health care provider. Make sure you discuss any questions you have with your health care provider.  Document Released: 11/28/2004 Document Revised: 11/20/2016 Document Reviewed: 06/14/2015  Elsevier Interactive Patient Education  2019 [employer]

## 2018-04-25 NOTE — MAU Note (Addendum)
Pain L rib cage for several days. Yesterday felt like "bubbles" under ribs when I breathed in and out.  Had virus with cough several wks ago but is gone now. Area aches constantly but if I cough or sneeze pain is stabbing. Have flexeril at home and tried that and extra strength tylenol and not helping

## 2018-04-27 ENCOUNTER — Encounter: Payer: Self-pay | Admitting: Obstetrics and Gynecology

## 2018-04-27 ENCOUNTER — Ambulatory Visit (INDEPENDENT_AMBULATORY_CARE_PROVIDER_SITE_OTHER): Payer: Medicaid Other | Admitting: Obstetrics and Gynecology

## 2018-04-27 VITALS — BP 133/85 | HR 111 | Wt 149.7 lb

## 2018-04-27 DIAGNOSIS — Z3A38 38 weeks gestation of pregnancy: Secondary | ICD-10-CM

## 2018-04-27 DIAGNOSIS — Z3483 Encounter for supervision of other normal pregnancy, third trimester: Secondary | ICD-10-CM

## 2018-04-27 DIAGNOSIS — Z3480 Encounter for supervision of other normal pregnancy, unspecified trimester: Secondary | ICD-10-CM

## 2018-04-27 NOTE — Progress Notes (Signed)
Pt presents for ROB c/o left sided rib pain since Weds.  Seen at MAU on Saturday - mild relief with Oxycodone.

## 2018-04-27 NOTE — Progress Notes (Signed)
   PRENATAL VISIT NOTE  Subjective:  Katherine Barber is a 33 y.o. G2P0010 at [redacted]w[redacted]d being seen today for ongoing prenatal care.  She is currently monitored for the following issues for this low-risk pregnancy and has Encounter for supervision of normal pregnancy, unspecified, unspecified trimester on their problem list.  Patient reports persistent left rib pain.  Contractions: Not present. Vag. Bleeding: None.  Movement: Present. Denies leaking of fluid.   The following portions of the patient's history were reviewed and updated as appropriate: allergies, current medications, past family history, past medical history, past social history, past surgical history and problem list. Problem list updated.  Objective:   Vitals:   04/27/18 0944  BP: 133/85  Pulse: (!) 111  Weight: 149 lb 11.2 oz (67.9 kg)    Fetal Status: Fetal Heart Rate (bpm): 135 Fundal Height: 37 cm Movement: Present     General:  Alert, oriented and cooperative. Patient is in no acute distress.  Skin: Skin is warm and dry. No rash noted.   Cardiovascular: Normal heart rate noted  Respiratory: Normal respiratory effort, no problems with respiration noted  Abdomen: Soft, gravid, appropriate for gestational age.  Pain/Pressure: Present     Pelvic: Cervical exam deferred        Extremities: Normal range of motion.  Edema: Trace  Mental Status: Normal mood and affect. Normal behavior. Normal judgment and thought content.   Assessment and Plan:  Pregnancy: G2P0010 at [redacted]w[redacted]d  1. Supervision of other normal pregnancy, antepartum Patient is doing well Continue comfort measures for rib pain  Term labor symptoms and general obstetric precautions including but not limited to vaginal bleeding, contractions, leaking of fluid and fetal movement were reviewed in detail with the patient. Please refer to After Visit Summary for other counseling recommendations.  Return in about 1 week (around 05/04/2018) for ROB.  No future  appointments.  Catalina Antigua, MD

## 2018-04-28 ENCOUNTER — Other Ambulatory Visit: Payer: Self-pay | Admitting: Advanced Practice Midwife

## 2018-04-28 MED ORDER — ALBUTEROL SULFATE HFA 108 (90 BASE) MCG/ACT IN AERS: 2.0000 | INHALATION_SPRAY | Freq: Four times a day (QID) | RESPIRATORY_TRACT | 3 refills | Status: DC | PRN

## 2018-05-05 ENCOUNTER — Other Ambulatory Visit: Payer: Self-pay | Admitting: Obstetrics & Gynecology

## 2018-05-05 ENCOUNTER — Ambulatory Visit (INDEPENDENT_AMBULATORY_CARE_PROVIDER_SITE_OTHER): Payer: Medicaid Other | Admitting: Obstetrics & Gynecology

## 2018-05-05 DIAGNOSIS — Z3A39 39 weeks gestation of pregnancy: Secondary | ICD-10-CM

## 2018-05-05 DIAGNOSIS — Z3483 Encounter for supervision of other normal pregnancy, third trimester: Secondary | ICD-10-CM

## 2018-05-05 DIAGNOSIS — Z3480 Encounter for supervision of other normal pregnancy, unspecified trimester: Secondary | ICD-10-CM

## 2018-05-05 NOTE — Progress Notes (Signed)
   PRENATAL VISIT NOTE  Subjective:  Katherine Barber is a 33 y.o. G2P0010 at [redacted]w[redacted]d being seen today for ongoing prenatal care.  She is currently monitored for the following issues for this low-risk pregnancy and has Encounter for supervision of normal pregnancy, unspecified, unspecified trimester on their problem list.  Patient reports occasional contractions and pressure.  Contractions: Irritability. Vag. Bleeding: None.  Movement: Present. Denies leaking of fluid.   The following portions of the patient's history were reviewed and updated as appropriate: allergies, current medications, past family history, past medical history, past social history, past surgical history and problem list. Problem list updated.  Objective:   Vitals:   05/05/18 0831  BP: 124/82  Pulse: 97  Weight: 154 lb (69.9 kg)    Fetal Status: Fetal Heart Rate (bpm): 135   Movement: Present     General:  Alert, oriented and cooperative. Patient is in no acute distress.  Skin: Skin is warm and dry. No rash noted.   Cardiovascular: Normal heart rate noted  Respiratory: Normal respiratory effort, no problems with respiration noted  Abdomen: Soft, gravid, appropriate for gestational age.  Pain/Pressure: Present     Pelvic: Cervical exam performed        Extremities: Normal range of motion.     Mental Status: Normal mood and affect. Normal behavior. Normal judgment and thought content.   Assessment and Plan:  Pregnancy: G2P0010 at [redacted]w[redacted]d  1. Supervision of other normal pregnancy, antepartum Doing well  Term labor symptoms and general obstetric precautions including but not limited to vaginal bleeding, contractions, leaking of fluid and fetal movement were reviewed in detail with the patient. Please refer to After Visit Summary for other counseling recommendations.  Return in about 1 week (around 05/12/2018).  No future appointments.  Scheryl Darter, MD

## 2018-05-05 NOTE — Patient Instructions (Signed)

## 2018-05-06 ENCOUNTER — Telehealth: Payer: Self-pay

## 2018-05-06 ENCOUNTER — Inpatient Hospital Stay (HOSPITAL_COMMUNITY): Payer: Medicaid Other | Admitting: Anesthesiology

## 2018-05-06 ENCOUNTER — Inpatient Hospital Stay (HOSPITAL_COMMUNITY)
Admission: AD | Admit: 2018-05-06 | Discharge: 2018-05-08 | DRG: 807 | Disposition: A | Discharge status: Home / Self Care | Payer: Medicaid Other | Attending: Obstetrics and Gynecology | Admitting: Obstetrics and Gynecology

## 2018-05-06 ENCOUNTER — Encounter (HOSPITAL_COMMUNITY): Payer: Self-pay

## 2018-05-06 ENCOUNTER — Other Ambulatory Visit: Payer: Self-pay

## 2018-05-06 DIAGNOSIS — O4202 Full-term premature rupture of membranes, onset of labor within 24 hours of rupture: Secondary | ICD-10-CM

## 2018-05-06 DIAGNOSIS — O99334 Smoking (tobacco) complicating childbirth: Secondary | ICD-10-CM | POA: Diagnosis present

## 2018-05-06 DIAGNOSIS — F17210 Nicotine dependence, cigarettes, uncomplicated: Secondary | ICD-10-CM | POA: Diagnosis present

## 2018-05-06 DIAGNOSIS — O134 Gestational [pregnancy-induced] hypertension without significant proteinuria, complicating childbirth: Secondary | ICD-10-CM | POA: Diagnosis present

## 2018-05-06 DIAGNOSIS — O139 Gestational [pregnancy-induced] hypertension without significant proteinuria, unspecified trimester: Secondary | ICD-10-CM | POA: Diagnosis present

## 2018-05-06 DIAGNOSIS — Z3A39 39 weeks gestation of pregnancy: Secondary | ICD-10-CM | POA: Diagnosis not present

## 2018-05-06 DIAGNOSIS — O4292 Full-term premature rupture of membranes, unspecified as to length of time between rupture and onset of labor: Principal | ICD-10-CM | POA: Diagnosis present

## 2018-05-06 LAB — URINALYSIS, ROUTINE W REFLEX MICROSCOPIC
Bacteria, UA: NONE SEEN
Bilirubin Urine: NEGATIVE
Glucose, UA: NEGATIVE mg/dL
KETONES UR: NEGATIVE mg/dL
Leukocytes,Ua: NEGATIVE
Nitrite: NEGATIVE
Protein, ur: 30 mg/dL — AB
Specific Gravity, Urine: 1.02 (ref 1.005–1.030)
pH: 7 (ref 5.0–8.0)

## 2018-05-06 LAB — COMPREHENSIVE METABOLIC PANEL
ALK PHOS: 189 U/L — AB (ref 38–126)
ALT: 18 U/L (ref 0–44)
AST: 34 U/L (ref 15–41)
Albumin: 3.1 g/dL — ABNORMAL LOW (ref 3.5–5.0)
Anion gap: 6 (ref 5–15)
BUN: 9 mg/dL (ref 6–20)
CALCIUM: 9.3 mg/dL (ref 8.9–10.3)
CO2: 24 mmol/L (ref 22–32)
Chloride: 108 mmol/L (ref 98–111)
Creatinine, Ser: 0.76 mg/dL (ref 0.44–1.00)
GFR calc Af Amer: >60 mL/min (ref >60)
GFR calc non Af Amer: >60 mL/min (ref >60)
Glucose, Bld: 74 mg/dL (ref 70–99)
Potassium: 6.1 mmol/L — ABNORMAL HIGH (ref 3.5–5.1)
Sodium: 138 mmol/L (ref 135–145)
Total Bilirubin: 0.5 mg/dL (ref 0.3–1.2)
Total Protein: 6 g/dL — ABNORMAL LOW (ref 6.5–8.1)

## 2018-05-06 LAB — PROTEIN / CREATININE RATIO, URINE
Creatinine, Urine: 126.25 mg/dL
Protein Creatinine Ratio: 0.17 mg/mg{Cre} — ABNORMAL HIGH (ref 0.00–0.15)
Total Protein, Urine: 21 mg/dL

## 2018-05-06 LAB — ABO/RH: ABO/RH(D): O POS

## 2018-05-06 LAB — CBC
HCT: 41.5 % (ref 36.0–46.0)
Hemoglobin: 14 g/dL (ref 12.0–15.0)
MCH: 35 pg — ABNORMAL HIGH (ref 26.0–34.0)
MCHC: 33.7 g/dL (ref 30.0–36.0)
MCV: 103.8 fL — ABNORMAL HIGH (ref 80.0–100.0)
Platelets: 172 10*3/uL (ref 150–400)
RBC: 4 MIL/uL (ref 3.87–5.11)
RDW: 14.1 % (ref 11.5–15.5)
WBC: 13.8 10*3/uL — AB (ref 4.0–10.5)
nRBC: 0 % (ref 0.0–0.2)

## 2018-05-06 LAB — TYPE AND SCREEN
ABO/RH(D): O POS
Antibody Screen: NEGATIVE

## 2018-05-06 LAB — POCT FERN TEST: POCT FERN TEST: POSITIVE

## 2018-05-06 MED ORDER — PHENYLEPHRINE 40 MCG/ML (10ML) SYRINGE FOR IV PUSH (FOR BLOOD PRESSURE SUPPORT)
80.0000 ug | PREFILLED_SYRINGE | INTRAVENOUS | Status: DC | PRN
  Filled 2018-05-06: qty 10

## 2018-05-06 MED ORDER — MISOPROSTOL 50MCG HALF TABLET
50.0000 ug | ORAL_TABLET | ORAL | Status: DC | PRN
  Administered 2018-05-06: 50 ug via BUCCAL
  Filled 2018-05-06: qty 1

## 2018-05-06 MED ORDER — FENTANYL CITRATE (PF) 100 MCG/2ML IJ SOLN
100.0000 ug | INTRAMUSCULAR | Status: DC | PRN
  Administered 2018-05-06 (×2): 100 ug via INTRAVENOUS
  Filled 2018-05-06 (×2): qty 2

## 2018-05-06 MED ORDER — LACTATED RINGERS IV SOLN: 500.0000 mL | INTRAVENOUS | Status: DC | PRN

## 2018-05-06 MED ORDER — LACTATED RINGERS IV SOLN: 500.0000 mL | Freq: Once | INTRAVENOUS | Status: DC

## 2018-05-06 MED ORDER — EPHEDRINE 5 MG/ML INJ: 10.0000 mg | INTRAVENOUS | Status: DC | PRN

## 2018-05-06 MED ORDER — MISOPROSTOL 25 MCG QUARTER TABLET
25.0000 ug | ORAL_TABLET | ORAL | Status: DC | PRN
  Filled 2018-05-06: qty 1

## 2018-05-06 MED ORDER — OXYCODONE-ACETAMINOPHEN 5-325 MG PO TABS: 2.0000 | ORAL_TABLET | ORAL | Status: DC | PRN

## 2018-05-06 MED ORDER — MISOPROSTOL 50MCG HALF TABLET: 50.0000 ug | ORAL_TABLET | ORAL | Status: DC | PRN

## 2018-05-06 MED ORDER — OXYCODONE-ACETAMINOPHEN 5-325 MG PO TABS: 1.0000 | ORAL_TABLET | ORAL | Status: DC | PRN

## 2018-05-06 MED ORDER — TERBUTALINE SULFATE 1 MG/ML IJ SOLN: 0.2500 mg | Freq: Once | INTRAMUSCULAR | Status: DC | PRN

## 2018-05-06 MED ORDER — ONDANSETRON HCL 4 MG/2ML IJ SOLN: 4.0000 mg | Freq: Four times a day (QID) | INTRAMUSCULAR | Status: DC | PRN

## 2018-05-06 MED ORDER — FLEET ENEMA 7-19 GM/118ML RE ENEM: 1.0000 | ENEMA | RECTAL | Status: DC | PRN

## 2018-05-06 MED ORDER — LACTATED RINGERS IV SOLN
INTRAVENOUS | Status: DC
  Administered 2018-05-06 (×2): via INTRAVENOUS

## 2018-05-06 MED ORDER — SOD CITRATE-CITRIC ACID 500-334 MG/5ML PO SOLN: 30.0000 mL | ORAL | Status: DC | PRN

## 2018-05-06 MED ORDER — OXYTOCIN 40 UNITS IN NORMAL SALINE INFUSION - SIMPLE MED
2.5000 [IU]/h | INTRAVENOUS | Status: DC
  Filled 2018-05-06: qty 1000

## 2018-05-06 MED ORDER — SODIUM CHLORIDE (PF) 0.9 % IJ SOLN
INTRAMUSCULAR | Status: DC | PRN
  Administered 2018-05-06: 12 mL/h via EPIDURAL

## 2018-05-06 MED ORDER — ACETAMINOPHEN 325 MG PO TABS: 650.0000 mg | ORAL_TABLET | ORAL | Status: DC | PRN

## 2018-05-06 MED ORDER — DIPHENHYDRAMINE HCL 50 MG/ML IJ SOLN: 12.5000 mg | INTRAMUSCULAR | Status: DC | PRN

## 2018-05-06 MED ORDER — FENTANYL-BUPIVACAINE-NACL 0.5-0.125-0.9 MG/250ML-% EP SOLN
12.0000 mL/h | EPIDURAL | Status: DC | PRN
  Filled 2018-05-06: qty 250

## 2018-05-06 MED ORDER — PHENYLEPHRINE 40 MCG/ML (10ML) SYRINGE FOR IV PUSH (FOR BLOOD PRESSURE SUPPORT): 80.0000 ug | PREFILLED_SYRINGE | INTRAVENOUS | Status: DC | PRN

## 2018-05-06 MED ORDER — OXYTOCIN BOLUS FROM INFUSION
500.0000 mL | Freq: Once | INTRAVENOUS | Status: AC
  Administered 2018-05-06: 500 mL via INTRAVENOUS

## 2018-05-06 MED ORDER — LIDOCAINE HCL (PF) 1 % IJ SOLN: 30.0000 mL | INTRAMUSCULAR | Status: DC | PRN

## 2018-05-06 MED ORDER — LIDOCAINE HCL (PF) 1 % IJ SOLN
INTRAMUSCULAR | Status: DC | PRN
  Administered 2018-05-06: 6 mL via EPIDURAL
  Administered 2018-05-06: 5 mL via EPIDURAL

## 2018-05-06 NOTE — MAU Provider Note (Signed)
History     CSN: [id number]  Arrival date and time: 05/06/18 1220   First Provider Initiated Contact with Patient 05/06/18 1313      Chief Complaint  Patient presents with  . Vaginal Bleeding  . Contractions   HPI  Ms.  Katherine Barber is a 33 y.o. year old G75P0010 female at [redacted]w[redacted]d weeks gestation who presents to MAU reporting thin watery,VB and abdominal pain/cramping that started @ 0730. She states that the bleeding "lightened up" after taking her shower. She denies gush of fluid. She reports (+) FM. She was seen at [facility] on 05/05/2018; cervix was closed.  Past Medical History:  Diagnosis Date  . Anxiety   . UTI (urinary tract infection)     Past Surgical History:  Procedure Laterality Date  . KNEE ARTHROSCOPY    . NOSE SURGERY    . PALATAL EXPANSION    . WRIST SURGERY      Family History  Problem Relation Age of Onset  . Congestive Heart Failure Father     Social History   Tobacco Use  . Smoking status: Current Every Day Smoker    Packs/day: 1.00  . Smokeless tobacco: Never Used  Substance Use Topics  . Alcohol use: Yes    Comment: socially- not since pregnancy  . Drug use: No    Allergies: No Known Allergies  Medications Prior to Admission  Medication Sig Dispense Refill Last Dose  . acetaminophen (TYLENOL) 500 MG tablet Take 2 tablets (1,000 mg total) by mouth every 6 (six) hours as needed for moderate pain. (Patient not taking: Reported on 01/08/2018) 30 tablet 0 Not Taking  . albuterol (PROVENTIL HFA;VENTOLIN HFA) 108 (90 Base) MCG/ACT inhaler Inhale 2 puffs into the lungs every 6 (six) hours as needed for wheezing or shortness of breath. 1 Inhaler 3   . azithromycin (ZITHROMAX) 250 MG tablet Take 2 tablets on Day 1, then 1 tablet daily on Days 2, 3, 4, and 5. (Patient not taking: Reported on 04/27/2018) 6 tablet 0 Not Taking  . cyclobenzaprine (FLEXERIL) 10 MG tablet Take 0.5-1 tablets (5-10 mg total) by mouth 3 (three) times daily as needed for muscle  spasms. (Patient not taking: Reported on 04/27/2018) 20 tablet 0 Not Taking  . cyclobenzaprine (FLEXERIL) 10 MG tablet Take 1 tablet (10 mg total) by mouth 3 (three) times daily as needed for muscle spasms. 30 tablet 2 Taking  . dextromethorphan 15 MG/5ML syrup Take 10 mLs (30 mg total) by mouth 4 (four) times daily as needed for cough. (Patient not taking: Reported on 04/20/2018) 120 mL 0 Not Taking  . docusate sodium (COLACE) 100 MG capsule Take 1 capsule (100 mg total) by mouth every 12 (twelve) hours. (Patient not taking: Reported on 01/08/2018) 60 capsule 0 Not Taking  . Elastic Bandages & Supports (COMFORT FIT MATERNITY SUPP LG) MISC Wear daily when ambulating (Patient not taking: Reported on 04/27/2018) 1 each 0 Not Taking  . Elastic Bandages & Supports (COMFORT FIT MATERNITY SUPP LG) MISC Wear daily when ambulating (Patient not taking: Reported on 04/27/2018) 1 each 0 Not Taking  . escitalopram (LEXAPRO) 10 MG tablet Take 10 mg by mouth daily.   Not Taking  . guaiFENesin (MUCINEX) 600 MG 12 hr tablet Take 2 tablets (1,200 mg total) by mouth 2 (two) times daily. (Patient not taking: Reported on 04/20/2018) 12 tablet 0 Not Taking  . guaiFENesin-codeine 100-10 MG/5ML syrup Take 5-10 mLs by mouth at bedtime as needed for cough. (Patient not taking:  Reported on 04/27/2018) 120 mL 0 Not Taking  . Misc. Devices MISC Dispense one maternity belt for patient (Patient not taking: Reported on 01/08/2018) 1 each 0 Not Taking  . oxyCODONE-acetaminophen (PERCOCET/ROXICET) 5-325 MG tablet Take 1-2 tablets by mouth every 4 (four) hours as needed for severe pain. 15 tablet 0 Taking  . polyethylene glycol (MIRALAX) packet Take 17 g by mouth daily. (Patient not taking: Reported on 01/08/2018) 14 each 0 Not Taking  . Prenatal Vit-Fe Fumarate-FA (MULTIVITAMIN-PRENATAL) 27-0.8 MG TABS tablet Take 1 tablet by mouth daily at 12 noon.   Taking  . trimethoprim-polymyxin b (POLYTRIM) ophthalmic solution Place 2 drops into the left  eye every 6 (six) hours. (Patient not taking: Reported on 02/13/2018) 10 mL 0 Not Taking    Review of Systems  Constitutional: Negative.   HENT: Negative.   Eyes: Negative.   Respiratory: Negative.   Cardiovascular: Negative.   Gastrointestinal: Positive for abdominal pain.  Endocrine: Negative.   Genitourinary: Positive for pelvic pain (UC's seem to be getting worse) and vaginal bleeding (watery).  Musculoskeletal: Negative.   Skin: Negative.   Allergic/Immunologic: Negative.   Neurological: Negative.   Hematological: Negative.   Psychiatric/Behavioral: Negative.    Physical Exam   Patient Vitals for the past 24 hrs:  BP Temp Temp src Pulse Resp Height Weight  05/06/18 1331 (!) 134/92 - - 88 - - -  05/06/18 1327 (!) 138/96 - - 85 - - -  05/06/18 1257 (!) 133/93 - - 93 - - -  05/06/18 1249 130/87 98.3 F (36.8 C) Oral 94 18  (1.6 m) 69.9 kg    Physical Exam  Nursing note and vitals reviewed. Constitutional: She is oriented to person, place, and time. She appears well-developed and well-nourished.  HENT:  Head: Normocephalic and atraumatic.  Eyes: Pupils are equal, round, and reactive to light.  Neck: Normal range of motion.  Cardiovascular: Normal rate.  Respiratory: Effort normal.  GI: Soft.  Genitourinary:    Genitourinary Comments: Speculum Exam: small amount of watery, bloody fluid -- fern slide obtained. Dilation: Fingertip Effacement (%): 80 Cervical Position: Posterior Station: -2 Presentation: Vertex Exam by: Carloyn Jaeger, CNM    Musculoskeletal: Normal range of motion.  Neurological: She is alert and oriented to person, place, and time.  Skin: Skin is warm and dry.  Psychiatric: She has a normal mood and affect. Her behavior is normal. Judgment and thought content normal.    MAU Course  Procedures  MDM CCUA Fern Slide -- positive CBC CMP P/C Ratio Serial BPs   Results for orders placed or performed during the hospital encounter of 05/06/18  (from the past 24 hour(s))  Urinalysis, Routine w reflex microscopic     Status: Abnormal   Collection Time: 05/06/18 12:59 PM  Result Value Ref Range   Color, Urine AMBER (A) YELLOW   APPearance HAZY (A) CLEAR   Specific Gravity, Urine 1.020 1.005 - 1.030   pH 7.0 5.0 - 8.0   Glucose, UA NEGATIVE NEGATIVE mg/dL   Hgb urine dipstick LARGE (A) NEGATIVE   Bilirubin Urine NEGATIVE NEGATIVE   Ketones, ur NEGATIVE NEGATIVE mg/dL   Protein, ur 30 (A) NEGATIVE mg/dL   Nitrite NEGATIVE NEGATIVE   Leukocytes,Ua NEGATIVE NEGATIVE   RBC / HPF 21-50 0 - 5 RBC/hpf   WBC, UA 0-5 0 - 5 WBC/hpf   Bacteria, UA NONE SEEN NONE SEEN   Squamous Epithelial / LPF 0-5 0 - 5   Mucus PRESENT  CBC     Status: Abnormal   Collection Time: 05/06/18  1:40 PM  Result Value Ref Range   WBC 13.8 (H) 4.0 - 10.5 K/uL   RBC 4.00 3.87 - 5.11 MIL/uL   Hemoglobin 14.0 12.0 - 15.0 g/dL   HCT 75.4 36.0 - 67.7 %   MCV 103.8 (H) 80.0 - 100.0 fL   MCH 35.0 (H) 26.0 - 34.0 pg   MCHC 33.7 30.0 - 36.0 g/dL   RDW 03.4 03.5 - 24.8 %   Platelets 172 150 - 400 K/uL   nRBC 0.0 0.0 - 0.2 %  Fern Test     Status: None   Collection Time: 05/06/18  2:05 PM  Result Value Ref Range   POCT Fern Test Positive = ruptured amniotic membanes     Assessment and Plan  Full-term premature rupture of membranes with onset of labor within 24 hours of rupture - Admit to L&D for augmentation of labor - Care assumed by V. Aundria Rud, CNM at time of admission  [name], MSN, CNM 05/06/2018, 1:13 PM

## 2018-05-06 NOTE — MAU Note (Signed)
Pt presents to MAU with c/o vaginal bleeding and cramping that started today around 0730. She states the bleeding was heavier this morning and now has lightened up. She denies LOF. +FM

## 2018-05-06 NOTE — Anesthesia Preprocedure Evaluation (Signed)
Anesthesia Evaluation  Patient identified by MRN, date of birth, ID band Patient awake    Reviewed: Allergy & Precautions, H&P , NPO status , Patient's Chart, lab work & pertinent test results  Airway Mallampati: I  TM Distance: >3 FB Neck ROM: full    Dental no notable dental hx. (+) Teeth Intact   Pulmonary neg pulmonary ROS, Current Smoker,    Pulmonary exam normal breath sounds clear to auscultation       Cardiovascular negative cardio ROS   Rhythm:regular Rate:Normal     Neuro/Psych negative neurological ROS  negative psych ROS   GI/Hepatic negative GI ROS, Neg liver ROS,   Endo/Other  negative endocrine ROS  Renal/GU negative Renal ROS  negative genitourinary   Musculoskeletal negative musculoskeletal ROS (+)   Abdominal Normal abdominal exam  (+)   Peds  Hematology negative hematology ROS (+)   Anesthesia Other Findings   Reproductive/Obstetrics (+) Pregnancy                             Anesthesia Physical Anesthesia Plan  ASA: II  Anesthesia Plan: Epidural   Post-op Pain Management:    Induction:   PONV Risk Score and Plan:   Airway Management Planned:   Additional Equipment:   Intra-op Plan:   Post-operative Plan:   Informed Consent: I have reviewed the patients History and Physical, chart, labs and discussed the procedure including the risks, benefits and alternatives for the proposed anesthesia with the patient or authorized representative who has indicated his/her understanding and acceptance.       Plan Discussed with:   Anesthesia Plan Comments:         Anesthesia Quick Evaluation

## 2018-05-06 NOTE — MAU Note (Signed)
Urine in lab 

## 2018-05-06 NOTE — Anesthesia Procedure Notes (Signed)
Epidural Patient location during procedure: OB Start time: 05/06/2018 5:46 PM End time: 05/06/2018 5:50 PM  Staffing Anesthesiologist: Leilani Able, MD Performed: anesthesiologist   Preanesthetic Checklist Completed: patient identified, site marked, surgical consent, pre-op evaluation, timeout performed, IV checked, risks and benefits discussed and monitors and equipment checked  Epidural Patient position: sitting Prep: site prepped and draped and DuraPrep Patient monitoring: continuous pulse ox and blood pressure Approach: midline Location: L3-L4 Injection technique: LOR air  Needle:  Needle type: Tuohy  Needle gauge: 17 G Needle length: 9 cm and 9 Needle insertion depth: 5 cm cm Catheter type: closed end flexible Catheter size: 19 Gauge Catheter at skin depth: 10 cm Test dose: negative and Other  Assessment Sensory level: T9 Events: blood not aspirated, injection not painful, no injection resistance, negative IV test and no paresthesia  Additional Notes Reason for block:procedure for pain

## 2018-05-06 NOTE — Progress Notes (Signed)
Labor Progress Note  Subjective: Pt resting more comfortably after epidural placement. Reports feeling pelvic pressure. Family at bedside.   Objective: BP 116/68   Pulse 80   Temp 98.4 F (36.9 C) (Oral)   Resp 18   Ht 5\' 3"  (1.6 m)   Wt 69.9 kg   LMP 08/01/2017   SpO2 100%   BMI 27.28 kg/m  Gen: well-appearing, no distress Dilation: 4 Effacement (%): 90 Cervical Position: Posterior Station: 0 Presentation: Vertex Exam by:: Henderson Newcomer  Assessment and Plan: 33 y.o. G2P0010 [redacted]w[redacted]d admitted for SROM and elevated BP. Contractions every 2 mins. Cervix 4/90/0, vtx. AROM forebag.   Labor: expectant management  -- pain control: epidural -- PPH Risk: low  Fetal Well-Being:  -- Cephalic by SVE.  -- FHR 130, +accels, +early/variable decels  -- GBS negative   Camelia Eng, SNM 6:39 PM

## 2018-05-06 NOTE — H&P (Addendum)
OBSTETRIC ADMISSION HISTORY AND PHYSICAL  Katherine Barber is a 33 y.o. female G2P0010 with IUP at [redacted]w[redacted]d by LMP admitted for PROM at 0800 and elevated blood pressure. Pt reports contractions every 4-5 minutes. Reports good fetal movement. Denies vaginal bleeding. Pt denies HA, vision changes, or RUQ pain. Pt is requesting pain medication and epidural at this time.  She received her prenatal care at [facility]  Support person in labor: Fiance, mother and siser  Ultrasounds . Anatomy U/S: normal  Prenatal History/Complications: . None  Past Medical History: Past Medical History:  Diagnosis Date  . Anxiety   . UTI (urinary tract infection)     Past Surgical History: Past Surgical History:  Procedure Laterality Date  . KNEE ARTHROSCOPY    . NOSE SURGERY    . PALATAL EXPANSION    . WRIST SURGERY      Obstetrical History: OB History    Gravida  2   Para  0   Term  0   Preterm  0   AB  1   Living  0     SAB  1   TAB  0   Ectopic  0   Multiple  0   Live Births  0           Social History: Social History   Socioeconomic History  . Marital status: Significant Other    Spouse name: Not on file  . Number of children: Not on file  . Years of education: Not on file  . Highest education level: Not on file  Occupational History  . Not on file  Social Needs  . Financial resource strain: Not on file  . Food insecurity:    Worry: Not on file    Inability: Not on file  . Transportation needs:    Medical: Not on file    Non-medical: Not on file  Tobacco Use  . Smoking status: Current Every Day Smoker    Packs/day: 1.00  . Smokeless tobacco: Never Used  Substance and Sexual Activity  . Alcohol use: Yes    Comment: socially- not since pregnancy  . Drug use: No  . Sexual activity: Yes    Partners: Male    Birth control/protection: None  Lifestyle  . Physical activity:    Days per week: Not on file    Minutes per session: Not on file  . Stress: Not on  file  Relationships  . Social connections:    Talks on phone: Not on file    Gets together: Not on file    Attends religious service: Not on file    Active member of club or organization: Not on file    Attends meetings of clubs or organizations: Not on file    Relationship status: Not on file  Other Topics Concern  . Not on file  Social History Narrative  . Not on file    Family History: Family History  Problem Relation Age of Onset  . Congestive Heart Failure Father     Allergies: No Known Allergies  Medications Prior to Admission  Medication Sig Dispense Refill Last Dose  . acetaminophen (TYLENOL) 500 MG tablet Take 2 tablets (1,000 mg total) by mouth every 6 (six) hours as needed for moderate pain. 30 tablet 0 05/05/2018 at Unknown time  . cyclobenzaprine (FLEXERIL) 10 MG tablet Take 1 tablet (10 mg total) by mouth 3 (three) times daily as needed for muscle spasms. 30 tablet 2 05/05/2018 at Unknown time  .  oxyCODONE-acetaminophen (PERCOCET/ROXICET) 5-325 MG tablet Take 1-2 tablets by mouth every 4 (four) hours as needed for severe pain. 15 tablet 0 Past Week at Unknown time  . Prenatal Vit-Fe Fumarate-FA (PRENATAL MULTIVITAMIN) TABS tablet Take 1 tablet by mouth at bedtime.   05/05/2018 at Unknown time  . albuterol (PROVENTIL HFA;VENTOLIN HFA) 108 (90 Base) MCG/ACT inhaler Inhale 2 puffs into the lungs every 6 (six) hours as needed for wheezing or shortness of breath. 1 Inhaler 3 Rescue  . azithromycin (ZITHROMAX) 250 MG tablet Take 2 tablets on Day 1, then 1 tablet daily on Days 2, 3, 4, and 5. (Patient not taking: Reported on 04/27/2018) 6 tablet 0 Completed Course at Unknown time  . dextromethorphan 15 MG/5ML syrup Take 10 mLs (30 mg total) by mouth 4 (four) times daily as needed for cough. (Patient not taking: Reported on 04/20/2018) 120 mL 0 Not Taking at Unknown time  . docusate sodium (COLACE) 100 MG capsule Take 1 capsule (100 mg total) by mouth every 12 (twelve) hours. (Patient  not taking: Reported on 01/08/2018) 60 capsule 0 Not Taking at Unknown time  . Elastic Bandages & Supports (COMFORT FIT MATERNITY SUPP LG) MISC Wear daily when ambulating (Patient not taking: Reported on 04/27/2018) 1 each 0 Not Taking  . Elastic Bandages & Supports (COMFORT FIT MATERNITY SUPP LG) MISC Wear daily when ambulating (Patient not taking: Reported on 04/27/2018) 1 each 0 Not Taking  . guaiFENesin (MUCINEX) 600 MG 12 hr tablet Take 2 tablets (1,200 mg total) by mouth 2 (two) times daily. (Patient not taking: Reported on 04/20/2018) 12 tablet 0 Not Taking at Unknown time  . guaiFENesin-codeine 100-10 MG/5ML syrup Take 5-10 mLs by mouth at bedtime as needed for cough. (Patient not taking: Reported on 04/27/2018) 120 mL 0 Not Taking at Unknown time  . Misc. Devices MISC Dispense one maternity belt for patient (Patient not taking: Reported on 01/08/2018) 1 each 0 Not Taking  . polyethylene glycol (MIRALAX) packet Take 17 g by mouth daily. (Patient not taking: Reported on 01/08/2018) 14 each 0 Not Taking at Unknown time  . trimethoprim-polymyxin b (POLYTRIM) ophthalmic solution Place 2 drops into the left eye every 6 (six) hours. (Patient not taking: Reported on 02/13/2018) 10 mL 0 Not Taking at Unknown time     Review of Systems  All systems reviewed and negative except as stated in HPI  Blood pressure (!) 148/100, pulse 86, temperature 97.8 F (36.6 C), temperature source Oral, resp. rate 20, height 5\' 3"  (1.6 m), weight 69.9 kg, last menstrual period 08/01/2017. General appearance: alert, appears stated age and moderate distress Lungs: no respiratory distress Heart: regular rate  Abdomen: soft, non-tender; gravid  Extremities: Homans sign is negative, no sign of DVT Presentation: cephalic Fetal monitoring: category 1; FHR 125, +accels, no decels Uterine activity: contractions q2-4 mins Dilation: 1 Effacement (%): 90 Station: 0 Exam by:: Henderson NewcomerStephanie Faulk, RN  Prenatal labs: ABO, Rh:  --/--/O POS, O POS Performed at [facility], [address] [place], [place] [address]  [id number](03/04 1449) Antibody: NEG (03/04 1449) Rubella: 2.22 (08/16 1011) RPR: Non Reactive (12/05 1056)  HBsAg: Negative (08/16 1011)  HIV: Non Reactive (12/05 1056)  GBS: Negative (02/17 0000)  Glucola: normal Genetic screening:  normal  Prenatal Transfer Tool  Maternal Diabetes: No Genetic Screening: Normal Maternal Ultrasounds/Referrals: Normal Fetal Ultrasounds or other Referrals:  None Maternal Substance Abuse:  No Significant Maternal Medications:  None Significant Maternal Lab Results: None  Results for orders  placed or performed during the hospital encounter of 05/06/18 (from the past 24 hour(s))  Urinalysis, Routine w reflex microscopic   Collection Time: 05/06/18 12:59 PM  Result Value Ref Range   Color, Urine AMBER (A) YELLOW   APPearance HAZY (A) CLEAR   Specific Gravity, Urine 1.020 1.005 - 1.030   pH 7.0 5.0 - 8.0   Glucose, UA NEGATIVE NEGATIVE mg/dL   Hgb urine dipstick LARGE (A) NEGATIVE   Bilirubin Urine NEGATIVE NEGATIVE   Ketones, ur NEGATIVE NEGATIVE mg/dL   Protein, ur 30 (A) NEGATIVE mg/dL   Nitrite NEGATIVE NEGATIVE   Leukocytes,Ua NEGATIVE NEGATIVE   RBC / HPF 21-50 0 - 5 RBC/hpf   WBC, UA 0-5 0 - 5 WBC/hpf   Bacteria, UA NONE SEEN NONE SEEN   Squamous Epithelial / LPF 0-5 0 - 5   Mucus PRESENT   Protein / creatinine ratio, urine   Collection Time: 05/06/18 12:59 PM  Result Value Ref Range   Creatinine, Urine 126.25 mg/dL   Total Protein, Urine 21 mg/dL   Protein Creatinine Ratio 0.17 (H) 0.00 - 0.15 mg/mg[Cre]  CBC   Collection Time: 05/06/18  1:40 PM  Result Value Ref Range   WBC 13.8 (H) 4.0 - 10.5 K/uL   RBC 4.00 3.87 - 5.11 MIL/uL   Hemoglobin 14.0 12.0 - 15.0 g/dL   HCT 58.8 32.5 - 49.8 %   MCV 103.8 (H) 80.0 - 100.0 fL   MCH 35.0 (H) 26.0 - 34.0 pg   MCHC 33.7 30.0 - 36.0 g/dL   RDW 26.4 15.8 - 30.9 %   Platelets 172 150 - 400 K/uL   nRBC  0.0 0.0 - 0.2 %  Comprehensive metabolic panel   Collection Time: 05/06/18  1:40 PM  Result Value Ref Range   Sodium 138 135 - 145 mmol/L   Potassium 6.1 (H) 3.5 - 5.1 mmol/L   Chloride 108 98 - 111 mmol/L   CO2 24 22 - 32 mmol/L   Glucose, Bld 74 70 - 99 mg/dL   BUN 9 6 - 20 mg/dL   Creatinine, Ser 4.07 0.44 - 1.00 mg/dL   Calcium 9.3 8.9 - 68.0 mg/dL   Total Protein 6.0 (L) 6.5 - 8.1 g/dL   Albumin 3.1 (L) 3.5 - 5.0 g/dL   AST 34 15 - 41 U/L   ALT 18 0 - 44 U/L   Alkaline Phosphatase 189 (H) 38 - 126 U/L   Total Bilirubin 0.5 0.3 - 1.2 mg/dL   GFR calc non Af Amer >60 >60 mL/min   GFR calc Af Amer >60 >60 mL/min   Anion gap 6 5 - 15  Fern Test   Collection Time: 05/06/18  2:05 PM  Result Value Ref Range   POCT Fern Test Positive = ruptured amniotic membanes   Type and screen [facility]   Collection Time: 05/06/18  2:49 PM  Result Value Ref Range   ABO/RH(D) O POS    Antibody Screen NEG    Sample Expiration      05/09/2018 Performed at [facility], [address] [place], [place] [address]   ABO/Rh   Collection Time: 05/06/18  2:49 PM  Result Value Ref Range   ABO/RH(D)      O POS Performed at [facility], [address], [place], [place] [address]     Patient Active Problem List   Diagnosis Date Noted  . Indication for care in labor or delivery 05/06/2018  .  Encounter for supervision of normal pregnancy, unspecified, unspecified trimester 10/17/2017    Assessment/Plan:  DYANA CARICO is a 33 y.o. G2P0010 at [redacted]w[redacted]d here for SROM and elevated blood pressure. PIH labs pending.   Labor:  -- Oral Cytotec with possible foley balloon placement -- Pain control: IV pain medication, epidural upon request  Fetal Wellbeing:  -- Cephalic by SVE.  -- GBS negative -- continuous fetal monitoring: category 1, FHR 125, +accels, no decels  Postpartum Planning -- bottle/formula -- birth control pills -- planning outpatient  circumcision  Camelia Eng, SNM  I confirm that I have verified the information documented in the nurse midwife student's note and that I have also personally reperformed the history, physical exam and all medical decision making activities of this service and have verified that all service and findings are accurately documented in this student's note.   PEC labs normal, continue to watch BP    Mcneil Sober 05/06/2018 7:44 PM

## 2018-05-06 NOTE — Anesthesia Pain Management Evaluation Note (Signed)
  [facility] Pain Management Visit Note  Patient: Katherine Barber, 33 y.o., female  "Hello I am a member of the anesthesia team at [facility] and [employer]. We have an anesthesia team available at all times to provide care throughout the hospital, including epidural management and anesthesia for C-section. I don't know your plan for the delivery whether it a natural birth, water birth, IV sedation, nitrous supplementation, doula or epidural, but we want to meet your pain goals."   1.Was your pain managed to your expectations on prior hospitalizations?   No prior hospitalizations  2.What is your expectation for pain management during this hospitalization?     Epidural  3.How can we help you reach that goal?   Record the patient's initial score and the patient's pain goal.   Pain: 7  Pain Goal: 3 [facility] wants you to be able to say your pain was always managed very well.  Katherine Barber 05/06/2018

## 2018-05-06 NOTE — Telephone Encounter (Signed)
Return call to pt regarding message pt c/o losing mucous plug and bleeding and off and on ctx's.  I returned pt call however she has no vm set up

## 2018-05-07 ENCOUNTER — Other Ambulatory Visit: Payer: Self-pay

## 2018-05-07 LAB — RPR: RPR: NONREACTIVE

## 2018-05-07 MED ORDER — WITCH HAZEL-GLYCERIN EX PADS: 1.0000 "application " | MEDICATED_PAD | CUTANEOUS | Status: DC | PRN

## 2018-05-07 MED ORDER — PRENATAL MULTIVITAMIN CH
1.0000 | ORAL_TABLET | Freq: Every day | ORAL | Status: DC
  Administered 2018-05-07 – 2018-05-08 (×2): 1 via ORAL
  Filled 2018-05-07 (×2): qty 1

## 2018-05-07 MED ORDER — SENNOSIDES-DOCUSATE SODIUM 8.6-50 MG PO TABS
2.0000 | ORAL_TABLET | ORAL | Status: DC
  Administered 2018-05-07 (×2): 2 via ORAL
  Filled 2018-05-07 (×2): qty 2

## 2018-05-07 MED ORDER — IBUPROFEN 600 MG PO TABS
600.0000 mg | ORAL_TABLET | Freq: Four times a day (QID) | ORAL | Status: DC
  Administered 2018-05-07 – 2018-05-08 (×6): 600 mg via ORAL
  Filled 2018-05-07 (×6): qty 1

## 2018-05-07 MED ORDER — OXYCODONE-ACETAMINOPHEN 5-325 MG PO TABS: 1.0000 | ORAL_TABLET | ORAL | Status: DC | PRN

## 2018-05-07 MED ORDER — SIMETHICONE 80 MG PO CHEW: 80.0000 mg | CHEWABLE_TABLET | ORAL | Status: DC | PRN

## 2018-05-07 MED ORDER — DIBUCAINE 1 % RE OINT: 1.0000 "application " | TOPICAL_OINTMENT | RECTAL | Status: DC | PRN

## 2018-05-07 MED ORDER — ONDANSETRON HCL 4 MG PO TABS: 4.0000 mg | ORAL_TABLET | ORAL | Status: DC | PRN

## 2018-05-07 MED ORDER — OXYCODONE-ACETAMINOPHEN 5-325 MG PO TABS: 2.0000 | ORAL_TABLET | ORAL | Status: DC | PRN

## 2018-05-07 MED ORDER — DIPHENHYDRAMINE HCL 25 MG PO CAPS: 25.0000 mg | ORAL_CAPSULE | Freq: Four times a day (QID) | ORAL | Status: DC | PRN

## 2018-05-07 MED ORDER — TETANUS-DIPHTH-ACELL PERTUSSIS 5-2.5-18.5 LF-MCG/0.5 IM SUSP: 0.5000 mL | Freq: Once | INTRAMUSCULAR | Status: DC

## 2018-05-07 MED ORDER — ONDANSETRON HCL 4 MG/2ML IJ SOLN: 4.0000 mg | INTRAMUSCULAR | Status: DC | PRN

## 2018-05-07 MED ORDER — BENZOCAINE-MENTHOL 20-0.5 % EX AERO
1.0000 "application " | INHALATION_SPRAY | CUTANEOUS | Status: DC | PRN
  Administered 2018-05-07: 1 via TOPICAL
  Filled 2018-05-07: qty 56

## 2018-05-07 MED ORDER — ACETAMINOPHEN 325 MG PO TABS
650.0000 mg | ORAL_TABLET | ORAL | Status: DC | PRN
  Administered 2018-05-07 (×2): 650 mg via ORAL
  Filled 2018-05-07 (×2): qty 2

## 2018-05-07 MED ORDER — COCONUT OIL OIL: 1.0000 "application " | TOPICAL_OIL | Status: DC | PRN

## 2018-05-07 MED ORDER — ZOLPIDEM TARTRATE 5 MG PO TABS: 5.0000 mg | ORAL_TABLET | Freq: Every evening | ORAL | Status: DC | PRN

## 2018-05-07 NOTE — Progress Notes (Signed)
Post Partum Day 1 Subjective: no complaints, up ad lib, voiding and tolerating PO, small lochia, plans to bottle feed, oral progesterone-only contraceptive  Objective: Blood pressure 127/80, pulse 71, temperature 98 F (36.7 C), temperature source Oral, resp. rate 18, height '5\' 3"'  (1.6 m), weight 69.9 kg, last menstrual period 08/01/2017, SpO2 99 %, unknown if currently breastfeeding.  Physical Exam:  General: alert, cooperative and no distress Lochia:normal flow Chest: CTAB Heart: RRR no m/r/g Abdomen: +BS, soft, nontender,  Uterine Fundus: firm DVT Evaluation: No evidence of DVT seen on physical exam. Extremities: Negatveedema  Recent Labs    05/06/18 1340  HGB 14.0  HCT 41.5    Assessment/Plan: Plan for discharge tomorrow or tonight, depending on pediatrician.  -Patient will have follow up appt for BP check next week at [employer]. Currently without s/s of pre-e; has had one elevated BP since delivery but met criteria for gestational hypertension while in-patient.     LOS: 1 day   Mervyn Skeeters [facility] 05/07/2018, 12:46 PM

## 2018-05-07 NOTE — Discharge Summary (Addendum)
OB Discharge Summary     Patient Name: Katherine Barber DOB: 08/06/1985 MRN: [id number]  Date of admission: 05/06/2018 Delivering MD: Brand Males   Date of discharge: 05/08/2018  Admitting diagnosis: pregnancy Intrauterine pregnancy: [redacted]w[redacted]d     Secondary diagnosis:  Active Problems:   Indication for care in labor or delivery   Gestational hypertension  Additional problems: none     Discharge diagnosis: Term Pregnancy Delivered and Gestational Hypertension                                                                                                Post partum procedures:NA  Augmentation: none  Complications: None  Hospital course:  Onset of Labor With Vaginal Delivery     33 y.o. yo G2P1011 at [redacted]w[redacted]d was admitted in Active Labor on 05/06/2018. Patient had an uncomplicated labor course, but was noted to have elevated BPs on admission and intrapartum. Pre-e labs were negative.  Membrane Rupture Time/Date: 7:30 AM ,05/06/2018   Intrapartum Procedures: Episiotomy: None [1]                                         Lacerations:  1st degree [2]  Patient had a delivery of a Viable infant. 05/06/2018  Information for the patient's newborn:  Cassondra, Kosciolek [[id number]]       Pateint had an uncomplicated postpartum course.  She remained normotensive except for one reading of 139/97 and was not started on BP medication. She is ambulating, tolerating a regular diet, passing flatus, and urinating well. Patient is discharged home in stable condition on 05/08/18.   Physical exam  Vitals:   05/07/18 0900 05/07/18 1121 05/07/18 2321 05/08/18 0510  BP: (!) 139/97 127/80 111/77 121/81  Pulse: 79 71 88 75  Resp: 18 18 18 16   Temp: 98 F (36.7 C) 98 F (36.7 C) 98.3 F (36.8 C) 97.9 F (36.6 C)  TempSrc: Oral Oral Oral Oral  SpO2:    97%  Weight:      Height:       General: alert, cooperative and no distress Lochia: appropriate Uterine Fundus: firm Incision: N/A DVT Evaluation: No  evidence of DVT seen on physical exam. Labs: Lab Results  Component Value Date   WBC 13.8 (H) 05/06/2018   HGB 14.0 05/06/2018   HCT 41.5 05/06/2018   MCV 103.8 (H) 05/06/2018   PLT 172 05/06/2018   CMP Latest Ref Rng & Units 05/06/2018  Glucose 70 - 99 mg/dL 74  BUN 6 - 20 mg/dL 9  Creatinine 6.37 - 8.58 mg/dL 8.50  Sodium 277 - 412 mmol/L 138  Potassium 3.5 - 5.1 mmol/L 6.1(H)  Chloride 98 - 111 mmol/L 108  CO2 22 - 32 mmol/L 24  Calcium 8.9 - 10.3 mg/dL 9.3  Total Protein 6.5 - 8.1 g/dL 6.0(L)  Total Bilirubin 0.3 - 1.2 mg/dL 0.5  Alkaline Phos 38 - 126 U/L 189(H)  AST 15 - 41 U/L 34  ALT 0 - 44 U/L  18    Discharge instruction: per After Visit Summary and "Baby and Me Booklet".  After visit meds:  Allergies as of 05/08/2018   No Known Allergies     Medication List    STOP taking these medications   azithromycin 250 MG tablet Commonly known as:  ZITHROMAX   Comfort Fit Maternity Supp Lg Misc   dextromethorphan 15 MG/5ML syrup   docusate sodium 100 MG capsule Commonly known as:  COLACE   guaiFENesin 600 MG 12 hr tablet Commonly known as:  Mucinex   guaiFENesin-codeine 100-10 MG/5ML syrup   Misc. Devices Misc   oxyCODONE-acetaminophen 5-325 MG tablet Commonly known as:  PERCOCET/ROXICET   polyethylene glycol packet Commonly known as:  MiraLax   trimethoprim-polymyxin b ophthalmic solution Commonly known as:  POLYTRIM     TAKE these medications   acetaminophen 500 MG tablet Commonly known as:  TYLENOL Take 2 tablets (1,000 mg total) by mouth every 6 (six) hours as needed for moderate pain.   albuterol 108 (90 Base) MCG/ACT inhaler Commonly known as:  PROVENTIL HFA;VENTOLIN HFA Inhale 2 puffs into the lungs every 6 (six) hours as needed for wheezing or shortness of breath.   cyclobenzaprine 10 MG tablet Commonly known as:  FLEXERIL Take 1 tablet (10 mg total) by mouth 3 (three) times daily as needed for muscle spasms.   ibuprofen 600 MG  tablet Commonly known as:  ADVIL,MOTRIN Take 1 tablet (600 mg total) by mouth every 6 (six) hours.   prenatal multivitamin Tabs tablet Take 1 tablet by mouth at bedtime.   senna-docusate 8.6-50 MG tablet Commonly known as:  Senokot-S Take 2 tablets by mouth daily. Start taking on:  May 09, 2018   Tdap 5-2.5-18.5 LF-MCG/0.5 injection Commonly known as:  BOOSTRIX Inject 0.5 mLs into the muscle once for 1 dose.       Diet: routine diet  Activity: Advance as tolerated. Pelvic rest for 6 weeks.   Outpatient follow up:1wk for BP check, then 4 wk PP visit Follow up Appt: Future Appointments  Date Time Provider Department Center  05/15/2018 10:00 AM [facility] NURSE [facility] None   Follow up Visit:No follow-ups on file.. Patient has BP check next week on 05/15/18. Patient to follow up with OB/GYN in 4-6 weeks for postpartum visit.   Postpartum contraception: Combination OCPs   Newborn Data: Live born female  Birth Weight: 6 lb 2.9 oz (2804 g) APGAR: 9, 9  Newborn Delivery   Birth date/time:  05/06/2018 20:22:00 Delivery type:  Vaginal, Spontaneous     Baby Feeding: Bottle Disposition:home with mother   05/08/2018 Arabella Merles, CNM  11:52 AM

## 2018-05-07 NOTE — Progress Notes (Signed)
CSW received consult for hx of anxiety.  CSW met with MOB to offer support and complete assessment.    MOB and FOB resting in bed with baby in the basinet when CSW entered the room. CSW introduced self, role and reason for consult. CSW explained assessment is usually done with just MOB but MOB requested FOB stay in the room. CSW reiterated reason for consult and ensured MOB was comfortable with CSW asking any questions in front of FOB. MOB understanding and confirmed CSW could ask anything in front of FOB. CSW inquired about MOB's mental health history. MOB explained she was diagnosed with anxiety about 15 years ago and her symptoms include irrational thinking and constant worrying. MOB has been on Lexapro for the last 2 years but stopped taking it when she found out she was pregnant. MOB reported she will likely go back on Lexapro in the next few weeks as it makes her feel better. MOB denied any current symptoms beyond being "tired". CSW encouraged MOB to rest when baby was resting. MOB denied any SI/HI or DV in the home. MOB listed FOB, her mother, family and friends as supports in the event she needed them. MOB confirmed she had all items needed for baby once discharged and was very knowledgeable regarding safe sleeping habits.   CSW provided education regarding the baby blues period vs. perinatal mood disorders, discussed treatment and gave resources for mental health follow up if concerns arise.  CSW recommends self-evaluation during the postpartum time period using the New Mom Checklist from Postpartum Progress and encouraged MOB to contact a medical professional if symptoms are noted at any time.    CSW reviewed Sudden Infant Death Syndrome (SIDS) precautions with MOB.    MOB denied having any further needs or questions for CSW. CSW identifies no further need for intervention and no barriers to discharge at this time.  Ollen Barges, [place]  [facility] [employer] [phone]

## 2018-05-07 NOTE — Lactation Note (Signed)
This note was copied from a baby's chart. Lactation Consultation Note  Patient Name: Katherine Barber Date: 05/07/2018 Reason for consult: Initial assessment;Primapara;1st time breastfeeding;Term  4 hours old FT female who is being exclusively formula by his mother, she's a P1. Mom told her NT Vesta Mixer that she was open to try BF because she felt baby was rooting every time she or dad would do STS with him. Mom participated in the [facility] program at the [facility] but she wasn't very familiar with hand expression. Reviewed hand expression with mom and she was able to get a small droplet of colostrum. Mom voiced she didn't have major breast changes during the pregnancy.  Baby was swaddled when entering the room, offered assistance with latch and mom agreed to have him STS. LC took baby to mom's left breast in football position but he wasn't able to latch, baby wasn't even cueing, probably because he was still full from his last formula feeding about an hour ago. Asked mom to call for assistance when needed. Reviewed normal newborn behavior, cluster feeding and feeding cues.  Feeding plan:  1. Encouraged mom to feed baby STS 8-12 times/24 hours or sooner if feeding cues are present 2. Mom will continue supplementing baby with [employer] Gentle according to formula supplementation guidelines 3. Hand expression and spoon feeding was also encouraged.  BF brochure and feeding diary were reviewed. Parents reported all questions and concerns were answered, they're both aware of LC services and will call PRN.  Maternal Data Formula Feeding for Exclusion: Yes Reason for exclusion: Mother's choice to formula feed on admision Has patient been taught Hand Expression?: No Does the patient have breastfeeding experience prior to this delivery?: No  Feeding Feeding Type: Breast Fed  Interventions Interventions: Breast feeding basics reviewed;Assisted with latch;Skin to skin;Breast massage;Hand express;Breast  compression;Adjust position;Support pillows  Lactation Tools Discussed/Used WIC Program: Yes   Consult Status Consult Status: PRN    Antonio Woodhams Venetia Constable 05/07/2018, 4:07 PM

## 2018-05-08 DIAGNOSIS — O139 Gestational [pregnancy-induced] hypertension without significant proteinuria, unspecified trimester: Secondary | ICD-10-CM | POA: Diagnosis present

## 2018-05-08 MED ORDER — IBUPROFEN 600 MG PO TABS: 600.0000 mg | ORAL_TABLET | Freq: Four times a day (QID) | ORAL | 0 refills | Status: DC

## 2018-05-08 MED ORDER — SENNOSIDES-DOCUSATE SODIUM 8.6-50 MG PO TABS: 2.0000 | ORAL_TABLET | ORAL | 0 refills | Status: DC

## 2018-05-08 MED ORDER — TETANUS-DIPHTH-ACELL PERTUSSIS 5-2.5-18.5 LF-MCG/0.5 IM SUSP: 0.5000 mL | Freq: Once | INTRAMUSCULAR | 0 refills | Status: AC

## 2018-05-08 NOTE — Anesthesia Postprocedure Evaluation (Signed)
Anesthesia Post Note  Patient: Katherine Barber  Procedure(s) Performed: AN AD HOC LABOR EPIDURAL     Patient location during evaluation: Mother Baby Anesthesia Type: Epidural Level of consciousness: awake and alert and oriented Pain management: satisfactory to patient Vital Signs Assessment: post-procedure vital signs reviewed and stable Respiratory status: respiratory function stable Cardiovascular status: stable Postop Assessment: no headache, no backache, epidural receding, patient able to bend at knees, no signs of nausea or vomiting and adequate PO intake Anesthetic complications: no    Last Vitals:  Vitals:   05/07/18 2321 05/08/18 0510  BP: 111/77 121/81  Pulse: 88 75  Resp: 18 16  Temp: 36.8 C 36.6 C  SpO2:  97%    Last Pain:  Vitals:   05/08/18 0905  TempSrc:   PainSc: 0-No pain   Pain Goal:                   Mell Mellott

## 2018-05-08 NOTE — Discharge Instructions (Signed)

## 2018-05-11 ENCOUNTER — Telehealth: Payer: Self-pay

## 2018-05-11 NOTE — Telephone Encounter (Signed)
Pt called stating that she had passed two blood clots in the past couple days since delivery. Denies having bright red bleeding or having to change a pad every hour. I advised pt this was normal and to keep an eye on it. Pt verbalized understanding.

## 2018-05-12 ENCOUNTER — Encounter: Payer: Medicaid Other | Admitting: Obstetrics & Gynecology

## 2018-05-15 ENCOUNTER — Emergency Department (HOSPITAL_BASED_OUTPATIENT_CLINIC_OR_DEPARTMENT_OTHER): Payer: Medicaid Other

## 2018-05-15 ENCOUNTER — Encounter (HOSPITAL_BASED_OUTPATIENT_CLINIC_OR_DEPARTMENT_OTHER): Payer: Self-pay | Admitting: Emergency Medicine

## 2018-05-15 ENCOUNTER — Other Ambulatory Visit: Payer: Self-pay

## 2018-05-15 ENCOUNTER — Emergency Department (HOSPITAL_BASED_OUTPATIENT_CLINIC_OR_DEPARTMENT_OTHER)
Admission: EM | Admit: 2018-05-15 | Discharge: 2018-05-15 | Disposition: A | Discharge status: Home / Self Care | Payer: Medicaid Other | Attending: Emergency Medicine | Admitting: Emergency Medicine

## 2018-05-15 ENCOUNTER — Inpatient Hospital Stay (HOSPITAL_COMMUNITY): Payer: Medicaid Other

## 2018-05-15 ENCOUNTER — Ambulatory Visit (INDEPENDENT_AMBULATORY_CARE_PROVIDER_SITE_OTHER): Payer: Medicaid Other | Admitting: *Deleted

## 2018-05-15 VITALS — BP 116/86 | HR 105 | Wt 130.0 lb

## 2018-05-15 DIAGNOSIS — Z79899 Other long term (current) drug therapy: Secondary | ICD-10-CM | POA: Insufficient documentation

## 2018-05-15 DIAGNOSIS — S2232XA Fracture of one rib, left side, initial encounter for closed fracture: Secondary | ICD-10-CM

## 2018-05-15 DIAGNOSIS — Y999 Unspecified external cause status: Secondary | ICD-10-CM | POA: Insufficient documentation

## 2018-05-15 DIAGNOSIS — F17210 Nicotine dependence, cigarettes, uncomplicated: Secondary | ICD-10-CM | POA: Diagnosis not present

## 2018-05-15 DIAGNOSIS — O165 Unspecified maternal hypertension, complicating the puerperium: Secondary | ICD-10-CM

## 2018-05-15 DIAGNOSIS — Y929 Unspecified place or not applicable: Secondary | ICD-10-CM | POA: Diagnosis not present

## 2018-05-15 DIAGNOSIS — X58XXXA Exposure to other specified factors, initial encounter: Secondary | ICD-10-CM | POA: Diagnosis not present

## 2018-05-15 DIAGNOSIS — O8620 Urinary tract infection following delivery, unspecified: Secondary | ICD-10-CM

## 2018-05-15 DIAGNOSIS — R399 Unspecified symptoms and signs involving the genitourinary system: Secondary | ICD-10-CM | POA: Diagnosis not present

## 2018-05-15 DIAGNOSIS — Y939 Activity, unspecified: Secondary | ICD-10-CM | POA: Insufficient documentation

## 2018-05-15 DIAGNOSIS — R079 Chest pain, unspecified: Secondary | ICD-10-CM | POA: Diagnosis not present

## 2018-05-15 DIAGNOSIS — R0782 Intercostal pain: Secondary | ICD-10-CM | POA: Diagnosis not present

## 2018-05-15 DIAGNOSIS — O9989 Other specified diseases and conditions complicating pregnancy, childbirth and the puerperium: Secondary | ICD-10-CM | POA: Insufficient documentation

## 2018-05-15 DIAGNOSIS — O99335 Smoking (tobacco) complicating the puerperium: Secondary | ICD-10-CM | POA: Diagnosis not present

## 2018-05-15 LAB — POCT URINALYSIS DIPSTICK
Bilirubin, UA: NEGATIVE
Glucose, UA: NEGATIVE
Ketones, UA: NEGATIVE
Nitrite, UA: NEGATIVE
Protein, UA: POSITIVE — AB
Spec Grav, UA: 1.015 (ref 1.010–1.025)
Urobilinogen, UA: 0.2 E.U./dL
pH, UA: 6 (ref 5.0–8.0)

## 2018-05-15 LAB — CBC
HCT: 41.5 % (ref 36.0–46.0)
Hemoglobin: 13.4 g/dL (ref 12.0–15.0)
MCH: 34.2 pg — ABNORMAL HIGH (ref 26.0–34.0)
MCHC: 32.3 g/dL (ref 30.0–36.0)
MCV: 105.9 fL — ABNORMAL HIGH (ref 80.0–100.0)
Platelets: 332 10*3/uL (ref 150–400)
RBC: 3.92 MIL/uL (ref 3.87–5.11)
RDW: 13.6 % (ref 11.5–15.5)
WBC: 10.9 10*3/uL — ABNORMAL HIGH (ref 4.0–10.5)
nRBC: 0 % (ref 0.0–0.2)

## 2018-05-15 LAB — BASIC METABOLIC PANEL
Anion gap: 6 (ref 5–15)
BUN: 15 mg/dL (ref 6–20)
CO2: 25 mmol/L (ref 22–32)
Calcium: 8.8 mg/dL — ABNORMAL LOW (ref 8.9–10.3)
Chloride: 105 mmol/L (ref 98–111)
Creatinine, Ser: 0.74 mg/dL (ref 0.44–1.00)
GFR calc Af Amer: >60 mL/min (ref >60)
GFR calc non Af Amer: >60 mL/min (ref >60)
Glucose, Bld: 81 mg/dL (ref 70–99)
Potassium: 3.3 mmol/L — ABNORMAL LOW (ref 3.5–5.1)
Sodium: 136 mmol/L (ref 135–145)

## 2018-05-15 LAB — TROPONIN I: Troponin I: <0.03 ng/mL (ref <0.03)

## 2018-05-15 MED ORDER — NAPROXEN 500 MG PO TABS: 500.0000 mg | ORAL_TABLET | Freq: Two times a day (BID) | ORAL | 0 refills | Status: DC

## 2018-05-15 MED ORDER — SODIUM CHLORIDE 0.9% FLUSH
3.0000 mL | Freq: Once | INTRAVENOUS | Status: DC
  Filled 2018-05-15: qty 3

## 2018-05-15 MED ORDER — KETOROLAC TROMETHAMINE 15 MG/ML IJ SOLN
15.0000 mg | Freq: Once | INTRAMUSCULAR | Status: AC
  Administered 2018-05-15: 15 mg via INTRAVENOUS
  Filled 2018-05-15: qty 1

## 2018-05-15 MED ORDER — IOPAMIDOL (ISOVUE-370) INJECTION 76%
100.0000 mL | Freq: Once | INTRAVENOUS | Status: AC | PRN
  Administered 2018-05-15: 100 mL via INTRAVENOUS

## 2018-05-15 MED ORDER — NITROFURANTOIN MONOHYD MACRO 100 MG PO CAPS: 100.0000 mg | ORAL_CAPSULE | Freq: Two times a day (BID) | ORAL | 0 refills | Status: AC

## 2018-05-15 NOTE — Discharge Instructions (Signed)
You were evaluated today for rib pain. You do have evidence of a rib fracture. Please use the breathing machine every 1-2 hours while awake. Make sure to take deep breaths frequently.  Follow up with PCP for reevaluation. May take Naproxen as needed for pain.  Return to the ED with any new or worsening symptoms.

## 2018-05-15 NOTE — ED Provider Notes (Signed)
[facility] EMERGENCY DEPARTMENT Provider Note   CSN: [id number] Arrival date & time: 05/15/18  2056  History   Chief Complaint Chief Complaint  Patient presents with   Chest Pain    HPI Katherine Barber is a 33 y.o. female with past medical history significant for anxiety who presents for evaluation of left-sided rib pain.  Patient states this is been ongoing x1 month.  States it first developed when she was [redacted] weeks pregnant.  Seen at [facility] and was diagnosed with costochondritis.  Was given Norco with moderately for her symptoms.  Patient states she gave birth on March 4.  States her pain resolved.  Patient states yesterday and today she has had increased pain to the same area.  States it will occur intermittently and feels like a sharp stabbing pain to her left lower ribs.  Patient states when this occurs she will become short of breath.  Resolves after 1 to 2 minutes.  Denies midline chest pain.  Symptoms not exertional in nature.  Denies associated lightheadedness, dizziness, diaphoresis.  Denies lower extremity swelling, cough, hemoptysis, recent travel or recent mobilization.  States she took Tylenol this morning with mild relief of her symptoms.  Rates her current pain a 5/10.  Pain does not radiate.  Denies fever, chills, nausea, vomiting, abdominal pain, diarrhea dysuria.  Denies additional aggravating or alleviating factors.  States when she was seen at [facility] she was told if this pain did not resolve she will need to have a CT scan of her chest to rule out a pulmonary embolism. Patient is not breastfeeding.  History obtained from patient.  No interpreter was used.     HPI  Past Medical History:  Diagnosis Date   Anxiety    UTI (urinary tract infection)     Patient Active Problem List   Diagnosis Date Noted   Gestational hypertension 05/08/2018   Indication for care in labor or delivery 05/06/2018   Encounter for supervision of normal  pregnancy, unspecified, unspecified trimester 10/17/2017    Past Surgical History:  Procedure Laterality Date   KNEE ARTHROSCOPY     NOSE SURGERY     PALATAL EXPANSION     WRIST SURGERY       OB History    Gravida  2   Para  1   Term  1   Preterm  0   AB  1   Living  1     SAB  1   TAB  0   Ectopic  0   Multiple  0   Live Births  1            Home Medications    Prior to Admission medications   Medication Sig Start Date End Date Taking? Authorizing Provider  acetaminophen (TYLENOL) 500 MG tablet Take 2 tablets (1,000 mg total) by mouth every 6 (six) hours as needed for moderate pain. 08/28/17   Little, Ambrose Finland, MD  albuterol (PROVENTIL HFA;VENTOLIN HFA) 108 (90 Base) MCG/ACT inhaler Inhale 2 puffs into the lungs every 6 (six) hours as needed for wheezing or shortness of breath. 04/28/18   Leftwich-Kirby, Wilmer Floor, CNM  cyclobenzaprine (FLEXERIL) 10 MG tablet Take 1 tablet (10 mg total) by mouth 3 (three) times daily as needed for muscle spasms. 04/20/18   Constant, Peggy, MD  ibuprofen (ADVIL,MOTRIN) 600 MG tablet Take 1 tablet (600 mg total) by mouth every 6 (six) hours. 05/08/18   Mullis, Kiersten P, DO  naproxen (  NAPROSYN) 500 MG tablet Take 1 tablet (500 mg total) by mouth 2 (two) times daily. 05/15/18   Nalini Alcaraz A, PA-C  nitrofurantoin, macrocrystal-monohydrate, (MACROBID) 100 MG capsule Take 1 capsule (100 mg total) by mouth 2 (two) times daily for 7 days. 1 po BID x 7days 05/15/18 05/22/18  Hermina Staggers, MD  Prenatal Vit-Fe Fumarate-FA (PRENATAL MULTIVITAMIN) TABS tablet Take 1 tablet by mouth at bedtime.    [provider]  senna-docusate (SENOKOT-S) 8.6-50 MG tablet Take 2 tablets by mouth daily. 05/09/18   Mullis, Dara Lords, DO    Family History Family History  Problem Relation Age of Onset   Congestive Heart Failure Father     Social History Social History   Tobacco Use   Smoking status: Current Every Day Smoker     Packs/day: 1.00   Smokeless tobacco: Never Used  Substance Use Topics   Alcohol use: Yes    Comment: socially- not since pregnancy   Drug use: No     Allergies   Patient has no known allergies.   Review of Systems Review of Systems  Constitutional: Negative.   HENT: Negative.   Respiratory: Negative.   Cardiovascular: Positive for chest pain.       Left rib pain.  Gastrointestinal: Negative.   Genitourinary: Negative.   Musculoskeletal: Negative.   Skin: Negative.   Neurological: Negative.   All other systems reviewed and are negative.    Physical Exam Updated Vital Signs BP (!) 132/104 (BP Location: Right Arm)    Pulse (!) 101    Temp 98.1 F (36.7 C) (Oral)    Resp 18    Ht  (1.6 m)    Wt 59 kg    LMP 08/01/2017    SpO2 100%    BMI 23.03 kg/m   Physical Exam Vitals signs and nursing note reviewed.  Constitutional:      General: She is not in acute distress.    Appearance: She is well-developed. She is not ill-appearing, toxic-appearing or diaphoretic.  HENT:     Head: Normocephalic and atraumatic.  Eyes:     Pupils: Pupils are equal, round, and reactive to light.  Neck:     Musculoskeletal: Normal range of motion.     Comments: No Neck stiffness or neck rigidity.  No meningismus. Cardiovascular:     Rate and Rhythm: Tachycardia present.     Heart sounds: Normal heart sounds.     Comments: No carotid bruits.  No murmurs, rubs or gallops Pulmonary:     Effort: No respiratory distress.     Comments: Clear to auscultation bilateral without wheeze, rhonchi rales.  No accessory muscle usage. Chest:     Comments: Chest wall tenderness palpation to left lower ribs.  No crepitus, deformity or edema.  No vesicular lesions. Abdominal:     General: There is no distension.     Comments: Soft, nontender without rebound or guarding.  Normoactive bowel sounds.  Musculoskeletal: Normal range of motion.     Comments: Moves all 4 extremities without difficulty.   Ambulatory in department that difficulty no lower extremity edema, erythema or tenderness.  Skin:    General: Skin is warm and dry.     Comments: No rashes or lesions.  Brisk capillary refill.  Neurological:     Mental Status: She is alert.      ED Treatments / Results  Labs (all labs ordered are listed, but only abnormal results are displayed) Labs Reviewed  BASIC METABOLIC  PANEL - Abnormal; Notable for the following components:      Result Value   Potassium 3.3 (*)    Calcium 8.8 (*)    All other components within normal limits  CBC - Abnormal; Notable for the following components:   WBC 10.9 (*)    MCV 105.9 (*)    MCH 34.2 (*)    All other components within normal limits  TROPONIN I  PREGNANCY, URINE    EKG EKG Interpretation  Date/Time:  Friday May 15 2018 21:13:16 EDT Ventricular Rate:  94 PR Interval:    QRS Duration: 82 QT Interval:  363 QTC Calculation: 454 R Axis:   68 Text Interpretation:  Sinus rhythm No old tracing to compare Confirmed by Rolan Bucco [id number]) on 05/15/2018 10:41:54 PM   Radiology Dg Chest 2 View  Result Date: 05/15/2018 CLINICAL DATA:  Chest pain EXAM: CHEST - 2 VIEW COMPARISON:  04/25/2018 FINDINGS: The heart size and mediastinal contours are within normal limits. Both lungs are clear. The visualized skeletal structures are unremarkable. IMPRESSION: No active cardiopulmonary disease. Electronically Signed   By: Deatra Robinson M.D.   On: 05/15/2018 21:32   Ct Angio Chest Pe W/cm &/or Wo Cm  Result Date: 05/15/2018 CLINICAL DATA:  Left-sided rib cage pain for a few weeks, worse today. Pain is increasing after patient delivered a baby 9 days ago. Negative D-dimer. EXAM: CT ANGIOGRAPHY CHEST WITH CONTRAST TECHNIQUE: Multidetector CT imaging of the chest was performed using the standard protocol during bolus administration of intravenous contrast. Multiplanar CT image reconstructions and MIPs were obtained to evaluate the vascular anatomy.  CONTRAST:  ISOVUE-370 IOPAMIDOL (ISOVUE-370) INJECTION 76% COMPARISON:  None. FINDINGS: Cardiovascular: Good opacification of the central and segmental pulmonary arteries. No focal filling defects. No evidence of significant pulmonary embolus. Normal heart size. No pericardial effusions. Normal caliber thoracic aorta. No evidence of dissection. Great vessel origins are patent. Mediastinum/Nodes: Esophagus is decompressed. No significant lymphadenopathy in the chest. Lungs/Pleura: Lungs are clear and expanded. No airspace disease or consolidation. No pleural effusions. No pneumothorax. Mediastinal contours appear intact. Upper Abdomen: No acute abnormalities. Musculoskeletal: There is a nondisplaced fracture of the left lateral sixth rib. Review of the MIP images confirms the above findings. IMPRESSION: 1. No evidence of significant pulmonary embolus. 2. Nondisplaced fracture of the left lateral sixth rib. 3. No evidence of active pulmonary disease. Electronically Signed   By: Burman Nieves M.D.   On: 05/15/2018 22:25    Procedures Procedures (including critical care time)  Medications Ordered in ED Medications  sodium chloride flush (NS) 0.9 % injection 3 mL (3 mLs Intravenous Not Given 05/15/18 2124)  iopamidol (ISOVUE-370) 76 % injection 100 mL (100 mLs Intravenous Contrast Given 05/15/18 2205)  ketorolac (TORADOL) 15 MG/ML injection 15 mg (15 mg Intravenous Given 05/15/18 2215)   Initial Impression / Assessment and Plan / ED Course  I have reviewed the triage vital signs and the nursing notes.  Pertinent labs & imaging results that were available during my care of the patient were reviewed by me and considered in my medical decision making (see chart for details).  33 year old female appears otherwise well presents for evaluation of left rib pain.  Afebrile, nonseptic, non-ill-appearing.  Symptoms intermittent x1 month.  Patient recently postpartum.  Denies chest pain, cough, hemoptysis,  lower extremity swelling.  Tenderness to palpation to left lower ribs.  No obvious edema, crepitus, deformity.  Seen at [facility] and was told she needed to be seen for  a CT scan to rule out a pulmonary embolism if her symptoms do not resolve.  She is mildly tachycardic.  No tachypnea or hypoxia or shortness of breath.  Labs obtained from triage.  CBC with mild leukocytosis of 10.9, troponin negative, metabolic panel with mild hypokalemia at 3.3, no additional electrolyte, renal or liver abnormality.  Chest x-ray without evidence of infiltrates, cardiomegaly, pulmonary edema, pneumothorax.  Low suspicion for ACS, dissection causing symptoms. EKG without ST/T changes.  Heart score 0, Wells criteria low risk, however given persistent symptoms and recent hypercoagulable state will obtain CT scan to rule out PE.  She is not breast-feeding.  Given Toradol for pain control.  CT scan with 6th left rib fracture non displaced. Denies injury, trauma, domestic abuse. No acute PE. Patient will be given spirometer. Chest xray without pulmonary injury. Discussed symptomatic management. NO tenderness over spleen. Patient hemodynamically stable and appropriate for dc home at this time. Discussed return precautions. Patient voiced understanding and is agreeable for follow-up.     Final Clinical Impressions(s) / ED Diagnoses   Final diagnoses:  Closed fracture of one rib of left side, initial encounter    ED Discharge Orders         Ordered    naproxen (NAPROSYN) 500 MG tablet  2 times daily     05/15/18 2309           Bracen Schum A, PA-C 05/15/18 2316    Rolan Bucco, MD 05/15/18 2320

## 2018-05-15 NOTE — Progress Notes (Signed)
Subjective:  Katherine Barber is a 33 y.o. female here for BP check.   Hypertension ROS: taking medications as instructed, no medication side effects noted, no TIA's, no chest pain on exertion, no dyspnea on exertion and no swelling of ankles.  Pt complaints of UTI symptoms today.  Objective:  BP (!) 133/101   Pulse (!) 105   Wt 130 lb (59 kg)   LMP 08/01/2017   BMI 23.03 kg/m  Repeat Manual BP: 116/86  Appearance alert, well appearing, and in no distress. General exam BP noted to be well controlled today in office.    Assessment:   Blood Pressure stable.   Urine dipstick shows positive for leukocytes, red blood cells, protein, Negative for all other components.   Plan:  Current treatment plan is effective, no change in therapy.  Pt to follow up in office 1 week for BP check and 4 weeks for PP visit.  Meds ordered this encounter  Medications  . nitrofurantoin, macrocrystal-monohydrate, (MACROBID) 100 MG capsule    Sig: Take 1 capsule (100 mg total) by mouth 2 (two) times daily for 7 days. 1 po BID x 7days    Dispense:  14 capsule    Refill:  0

## 2018-05-15 NOTE — Progress Notes (Signed)
Agree with A & P. 

## 2018-05-15 NOTE — ED Triage Notes (Signed)
Pt states she has left side rib cage pain for the past few weeks getting worse today, pt states she just had a baby 9 days ago and this pain is increasing, was seen for the same on [facility] and encourage to return to ED if not getting better to r/o PE.

## 2018-05-17 LAB — URINE CULTURE: Organism ID, Bacteria: NO GROWTH

## 2018-05-22 ENCOUNTER — Ambulatory Visit (INDEPENDENT_AMBULATORY_CARE_PROVIDER_SITE_OTHER): Payer: Medicaid Other

## 2018-05-22 ENCOUNTER — Other Ambulatory Visit: Payer: Self-pay

## 2018-05-22 ENCOUNTER — Encounter: Payer: Self-pay | Admitting: *Deleted

## 2018-05-22 ENCOUNTER — Other Ambulatory Visit: Payer: Self-pay | Admitting: Obstetrics and Gynecology

## 2018-05-22 VITALS — BP 133/81

## 2018-05-22 DIAGNOSIS — O165 Unspecified maternal hypertension, complicating the puerperium: Secondary | ICD-10-CM

## 2018-05-22 MED ORDER — HYDROCHLOROTHIAZIDE 25 MG PO TABS: 25.0000 mg | ORAL_TABLET | Freq: Every day | ORAL | 0 refills | Status: DC

## 2018-05-22 NOTE — Progress Notes (Signed)
I have reviewed the chart and agree with nursing staff's documentation of this patient's encounter.  Catalina Antigua, MD 05/22/2018 1:53 PM

## 2018-05-22 NOTE — Progress Notes (Signed)
Subjective:  Katherine Barber is a 33 y.o. female here for BP check.   Hypertension ROS: no medication side effects noted, no TIA's, no chest pain on exertion, no dyspnea on exertion and no swelling of ankles.    Objective:  LMP 08/01/2017   Appearance alert, well appearing, and in no distress. General exam BP noted to be well controlled today in office.    Assessment:   Blood Pressure needs improvement.   Plan:  Pt to start HCTZ 25 mg.  RTO in 1 week for BP check.

## 2018-05-29 ENCOUNTER — Other Ambulatory Visit: Payer: Self-pay

## 2018-05-29 ENCOUNTER — Ambulatory Visit (INDEPENDENT_AMBULATORY_CARE_PROVIDER_SITE_OTHER): Payer: Medicaid Other

## 2018-05-29 VITALS — BP 103/68 | HR 93 | Ht 63.0 in | Wt 127.0 lb

## 2018-05-29 DIAGNOSIS — O165 Unspecified maternal hypertension, complicating the puerperium: Secondary | ICD-10-CM

## 2018-05-29 NOTE — Progress Notes (Signed)
Subjective:  Katherine Barber is a 33 y.o. female here for BP check.   Hypertension ROS: taking medications as instructed, no medication side effects noted, no TIA's, no chest pain on exertion, no dyspnea on exertion and no swelling of ankles.    Objective:  BP 103/68   Pulse 93   Ht 5\' 3"  (1.6 m)   Wt 127 lb (57.6 kg)   LMP 08/01/2017   Breastfeeding Yes   BMI 22.50 kg/m   Appearance alert, well appearing, and in no distress. General exam BP noted to be well controlled today in office.    Assessment:   Blood Pressure well controlled.   Plan:  Current treatment plan is effective, no change in therapy.

## 2018-06-03 ENCOUNTER — Other Ambulatory Visit: Payer: Self-pay

## 2018-06-03 ENCOUNTER — Encounter: Payer: Self-pay | Admitting: Obstetrics and Gynecology

## 2018-06-03 ENCOUNTER — Ambulatory Visit (INDEPENDENT_AMBULATORY_CARE_PROVIDER_SITE_OTHER): Payer: Medicaid Other | Admitting: Clinical

## 2018-06-03 ENCOUNTER — Ambulatory Visit (INDEPENDENT_AMBULATORY_CARE_PROVIDER_SITE_OTHER): Payer: Medicaid Other | Admitting: Obstetrics and Gynecology

## 2018-06-03 DIAGNOSIS — F4322 Adjustment disorder with anxiety: Secondary | ICD-10-CM

## 2018-06-03 DIAGNOSIS — Z1389 Encounter for screening for other disorder: Secondary | ICD-10-CM

## 2018-06-03 MED ORDER — LEVONORGEST-ETH ESTRADIOL-IRON 0.1-20 MG-MCG(21) PO TABS: 1.0000 | ORAL_TABLET | Freq: Every day | ORAL | 4 refills | Status: DC

## 2018-06-03 NOTE — BH Specialist Note (Signed)
Integrated Behavioral Health Visit via Telemedicine (Telephone)  06/03/2018 Katherine Barber [id number]   Session Start time: 12:57  Session End time: 1:55 Total time: 1 hour  Referring Provider: Catalina Antigua, MD Type of Visit: Telephonic Patient location: Home [facility] Provider location: [facility] All persons participating in visit: [name] and Patient   Confirmed patient's address: Yes  Confirmed patient's phone number: Yes  Any changes to demographics: No   Confirmed patient's insurance: No  Any changes to patient's insurance: Yes  Medicaid  Discussed confidentiality: Yes    The following statements were read to the patient and/or legal guardian that are established with the [facility] Provider.  "The purpose of this phone visit is to provide behavioral health care while limiting exposure to the coronavirus (COVID19).  There is a possibility of technology failure and discussed alternative modes of communication if that failure occurs."  "By engaging in this telephone visit, you consent to the provision of healthcare.  Additionally, you authorize for your insurance to be billed for the services provided during this telephone visit."   Patient and/or legal guardian consented to telephone visit: Yes   PRESENTING CONCERNS: Patient and/or family reports the following symptoms/concerns: Pt states her primary concern today is an increase in anxiety postpartum, attributed to life stress and toxic relationship with fiancee, leading to "stress-induced stomach spasms", "teeth clenching", and feeling overwhelmed. Pt is very open to learning self-coping strategy today.  Duration of problem: Ongoing; Severity of problem: moderate  STRENGTHS (Protective Factors/Coping Skills): Supportive friends and family, positive outlook, taking medication as prescribed, open to learning new coping skills  GOALS ADDRESSED: Patient will: 1.  Reduce symptoms of: anxiety and stress  2.  Increase knowledge and/or  ability of: coping skills, healthy habits and stress reduction  3.  Demonstrate ability to: Increase healthy adjustment to current life circumstances, Increase adequate support systems for patient/family and Increase motivation to adhere to plan of care  INTERVENTIONS: Interventions utilized:  Mindfulness or Relaxation Training, Brief CBT, Psychoeducation and/or Health Education and Link to [employer] Standardized Assessments completed: [place] Postnatal Depression  ASSESSMENT: Patient currently experiencing Adjustment disorder with anxious mood .   Patient may benefit from psychoeducation and brief therapeutic interventions regarding coping with symptoms of anxiety.   PLAN: 1. Follow up with behavioral health clinician on : One week telehealth follow up 2. Behavioral recommendations:  -CALM relaxation breathing exercise at least twice daily (morning; at bedtime) -Begin using apps for individual and family coping, as discussed -Continue taking Lexapro as prescribed -Consider attending at least one online new mom group at www.postpartum.net for additional support -Consider contacting [employer] at [phone] for Family-to-Family support options -Continue working with [facility] and utilizing their services, as needed (including DV safety plan) 3. Referral(s): Integrated [employer] (In Clinic) and [employer]:  Mom support; [facility]  [facility]

## 2018-06-03 NOTE — Patient Instructions (Signed)

## 2018-06-03 NOTE — Progress Notes (Signed)
Subjective:     Katherine Barber is a 33 y.o. female who presents for a postpartum visit. She is 4 weeks postpartum following a spontaneous vaginal delivery. I have fully reviewed the prenatal and intrapartum course. The delivery was at 39.5 gestational weeks. Outcome: spontaneous vaginal delivery. Anesthesia: epidural. Postpartum course has been Unremarkable. Baby's course has been Unremarkable. Baby is feeding by both breast and bottle - Lucien Mons Start. Bleeding no bleeding. Bowel function is normal. Bladder function is normal. Patient is not sexually active. Contraception method is none. Postpartum depression screening: positive. Patient denies any suicidal or homicidal ideations. She states she is not overwhelmed with caring for her newborn but rather it is with her FOB. He suffers from bipolar disorder     Review of Systems Pertinent items are noted in HPI.   Objective:    BP 114/78   Pulse 93   Ht 5\' 3"  (1.6 m)   Wt 127 lb 1.6 oz (57.7 kg)   LMP 08/01/2017   Breastfeeding Yes   BMI 22.51 kg/m   General:  alert, cooperative and no distress   Breasts:  inspection negative, no nipple discharge or bleeding, no masses or nodularity palpable  Lungs: clear to auscultation bilaterally  Heart:  regular rate and rhythm  Abdomen: soft, non-tender; bowel sounds normal; no masses,  no organomegaly   Vulva:  normal  Vagina: normal vagina, no discharge, exudate, lesion, or erythema  Cervix:  multiparous appearance  Corpus: normal size, contour, position, consistency, mobility, non-tender  Adnexa:  normal adnexa and no mass, fullness, tenderness  Rectal Exam: Not performed.        Assessment:     Normal postpartum exam. Pap smear not done at today's visit.   Plan:    1. Contraception: OCP (estrogen/progesterone) 2. Patient is medically cleared to resume all activities of daily living. Patient referred to integrated behavioral health 3. Follow up in: 6 months for annual exam with pap  smear or as needed.

## 2018-06-04 NOTE — Addendum Note (Signed)
Addended by: Hulda Marin C on: 06/04/2018 08:32 AM   Modules accepted: Level of Service

## 2018-06-08 ENCOUNTER — Telehealth: Payer: Self-pay | Admitting: Obstetrics and Gynecology

## 2018-06-08 ENCOUNTER — Ambulatory Visit (INDEPENDENT_AMBULATORY_CARE_PROVIDER_SITE_OTHER): Payer: Medicaid Other | Admitting: Clinical

## 2018-06-08 DIAGNOSIS — F4322 Adjustment disorder with anxiety: Secondary | ICD-10-CM | POA: Diagnosis not present

## 2018-06-08 NOTE — Telephone Encounter (Signed)
Patient returned call, notified of potential exposure.  Patient denies any symptoms at this time.

## 2018-06-08 NOTE — Telephone Encounter (Signed)
I attempted to reach you by phone.  You were not available and your voicemail was not set up.     I'm calling to inform you that during your office visit, you may have been exposed to a heath care worker who had an unknown respiratory illness. The employee had no fever but out of caution, we are notifying you so that you can be on alert for any of the following symptoms: fever, cough, sore throat, shortness of breath, or body aches. If any of these symptoms occur between 06/03/18-06/17/18  please contact us at [phone] and notify your primary care physician. We will get back to you during normal business hours. Considering the current respiratory illness season, please continue to practice social distancing, stay home if possible, and quarantine yourself until April 15th.

## 2018-06-08 NOTE — BH Specialist Note (Signed)
  Integrated Behavioral Health Visit via Telemedicine (Telephone)  06/08/2018 Katherine Barber [id number]  Called patient, left no message, as voicemail was not set up; pt returned call at 8:36am.   Session Start time: 8:36 Session End time: 9:18 Total time: 40 minutes  Referring Provider: Catalina Antigua, MD Type of Visit: Telephonic Patient location: Home [facility] Provider location: [facility] All persons participating in visit: [facility] and Patient  Confirmed patient's address: Yes  Confirmed patient's phone number: Yes  Any changes to demographics: No   Confirmed patient's insurance: Yes  Any changes to patient's insurance: No   Discussed confidentiality: Yes    The following statements were read to the patient and/or legal guardian that are established with the [facility] Provider.  "The purpose of this phone visit is to provide behavioral health care while limiting exposure to the coronavirus (COVID19).  There is a possibility of technology failure and discussed alternative modes of communication if that failure occurs."  "By engaging in this telephone visit, you consent to the provision of healthcare.  Additionally, you authorize for your insurance to be billed for the services provided during this telephone visit."   Patient and/or legal guardian consented to telephone visit: Yes   PRESENTING CONCERNS: Patient and/or family reports the following symptoms/concerns: Pt states that the past week has been more maneagable, she is doing daily breathing exercises. Pt's primary concern today is overanalyzing potential future problems, in her mind, and having difficulty focusing on the present.  Duration of problem: Ongoing; Severity of problem: moderate  STRENGTHS (Protective Factors/Coping Skills): Supportive friends and family, positive outlook, taking medication as prescribed; open to learning new coping skills  GOALS ADDRESSED: Patient will: 1.  Reduce symptoms of: anxiety and stress   2.  Increase knowledge and/or ability of: coping skills  3.  Demonstrate ability to: Increase healthy adjustment to current life circumstances  INTERVENTIONS: Interventions utilized:  Mindfulness or Relaxation Training Standardized Assessments completed: not given today  ASSESSMENT: Patient currently experiencing Adjustment disorder with anxiety.   Patient may benefit from brief therapeutic intervention regarding coping with symptoms of anxiety.  PLAN: 1. Follow up with behavioral health clinician on : One week 2. Behavioral recommendations:  -Begin practicing grounding strategy at least once daily (5 things you see, 4 things you hear, 3 things you feel, 2 things you smell, 1 thing you taste, all in the present moment) -Continue using CALM relaxation breathing exercise twice daily (morning; at bedtime) -Continue taking medication, as prescribed -Sign up to join at least one online new mom support group at www.postpartum.net -Continue working with [facility] services, as needed -Consider [facility] Family support at [phone]  3. Referral(s): Integrated [employer] (In Clinic)  [facility]

## 2018-06-10 ENCOUNTER — Ambulatory Visit: Payer: Self-pay

## 2018-06-11 ENCOUNTER — Other Ambulatory Visit: Payer: Self-pay | Admitting: Obstetrics and Gynecology

## 2018-06-16 ENCOUNTER — Ambulatory Visit (INDEPENDENT_AMBULATORY_CARE_PROVIDER_SITE_OTHER): Payer: Medicaid Other | Admitting: Clinical

## 2018-06-16 ENCOUNTER — Other Ambulatory Visit: Payer: Self-pay

## 2018-06-16 DIAGNOSIS — F4322 Adjustment disorder with anxiety: Secondary | ICD-10-CM

## 2018-06-16 NOTE — BH Specialist Note (Signed)
Integrated Behavioral Health Visit via Telemedicine (Telephone)  06/16/2018 PEGGE NIPPLE [id number]   Session Start time: 1:05  Session End time: 2:00 Total time: 1 hour  Referring Provider: Catalina Antigua, MD Type of Visit: Telephonic Patient location: Home  [facility] Provider location: [facility] All persons participating in visit: [facility] and Patient   Confirmed patient's address: Yes  Confirmed patient's phone number: Yes  Any changes to demographics: No   Confirmed patient's insurance: Yes  Any changes to patient's insurance: No   Discussed confidentiality: Yes   The following statements were read to the patient and/or legal guardian that are established with the [facility] Provider.  "The purpose of this phone visit is to provide behavioral health care while limiting exposure to the coronavirus (COVID19).  There is a possibility of technology failure and discussed alternative modes of communication if that failure occurs."  "By engaging in this telephone visit, you consent to the provision of healthcare.  Additionally, you authorize for your insurance to be billed for the services provided during this telephone visit."   Patient and/or legal guardian consented to telephone visit: Yes   PRESENTING CONCERNS: Patient and/or family reports the following symptoms/concerns: Pt states her primary concern today is anxiety, escalated with current relationship issues.  Duration of problem: Ongoing; Severity of problem: Moderate  STRENGTHS (Protective Factors/Coping Skills): Supportive friends and family, positive outlook, taking medication as prescribed ; open to learning new coping skills    GOALS ADDRESSED: Patient will: 1.  Reduce symptoms of: anxiety and stress  2.  Increase knowledge and/or ability of: stress reduction  3.  Demonstrate ability to: Increase healthy adjustment to current life circumstances  INTERVENTIONS: Interventions utilized:  Brief CBT Standardized  Assessments completed: Not given today  ASSESSMENT: Patient currently experiencing Adjustment disorder with anxiety.    Patient may benefit from Not given today.  PLAN: 1. Follow up with behavioral health clinician on : As needed 2. Behavioral recommendations:  -Continue using self-coping strategies that are working currently (relaxation breathing, grounding strategy, etc.) -Continue taking medication, as prescribed -Consider establishing care with [facility] [place] for ongoing therapy 3. Referral(s): [facility] (In Clinic) and [facility] (LME/Outside Clinic)  [facility]

## 2018-08-25 ENCOUNTER — Other Ambulatory Visit: Payer: Self-pay

## 2018-10-31 IMAGING — US US MFM OB DETAIL+14 WK
1 series · 13 of 28 positions shown · non-contrast
Comparison: none

[Series 1: us mfm ob detail+14 wk · 13 of 102 slices shown]
[im 4/102]
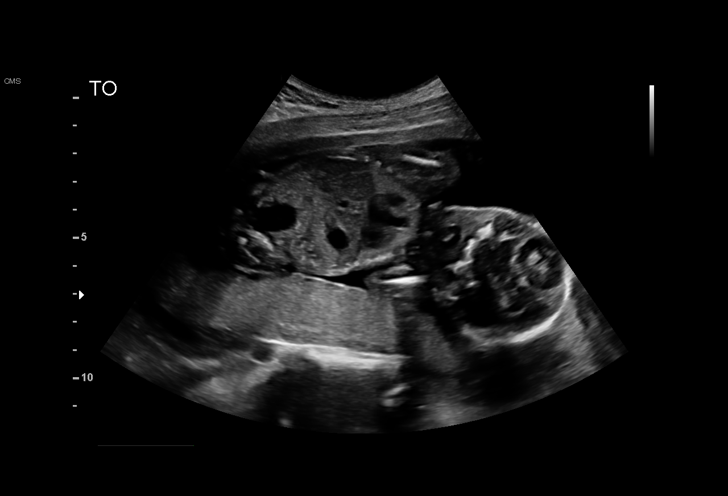
[im 12/102]
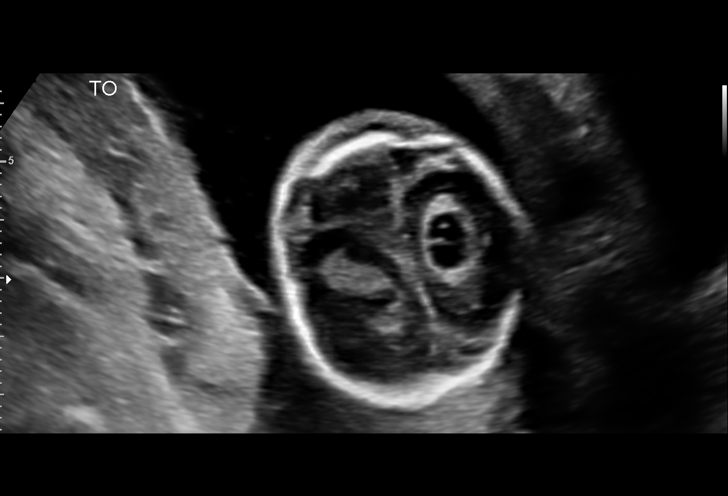
[im 19/102]
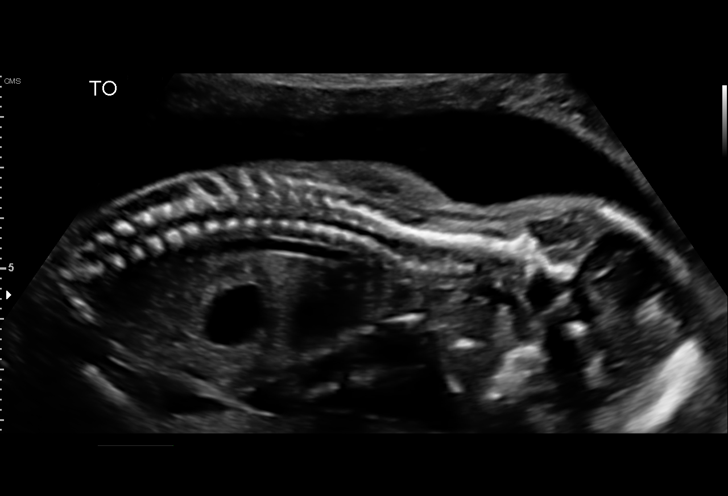
[im 27/102]
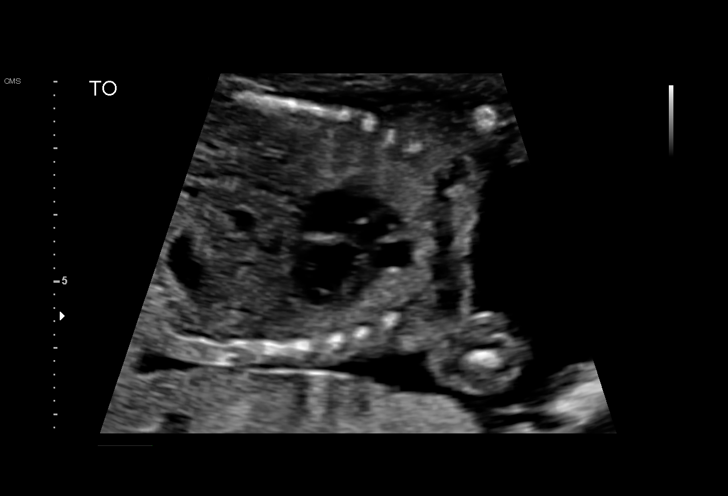
[im 34/102]
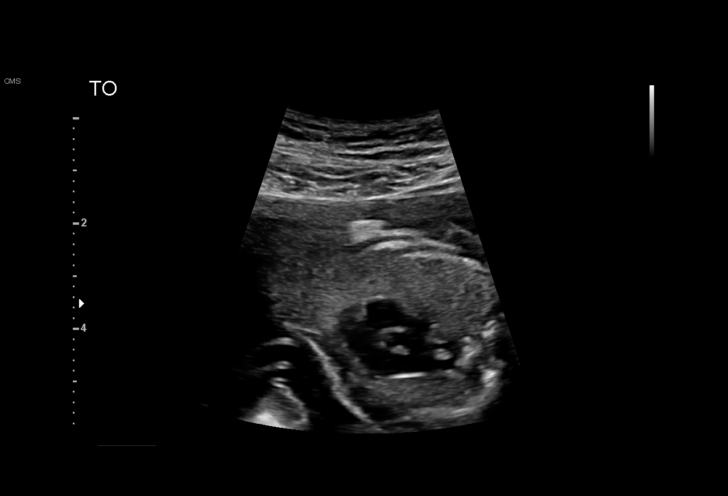
[im 42/102]
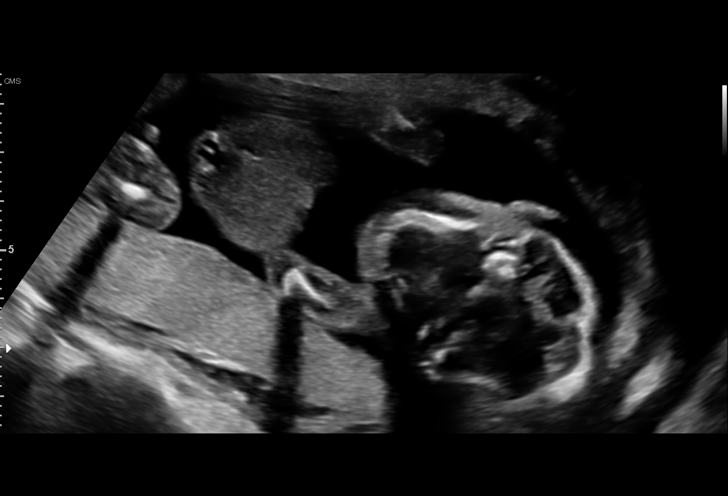
[im 53/102]
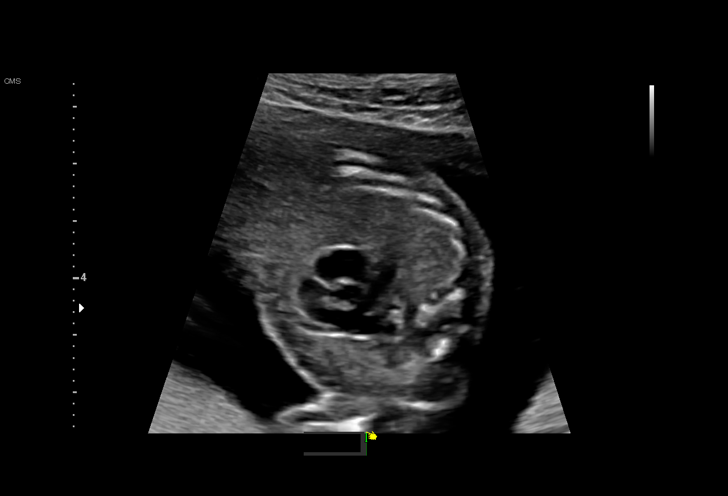
[im 60/102]
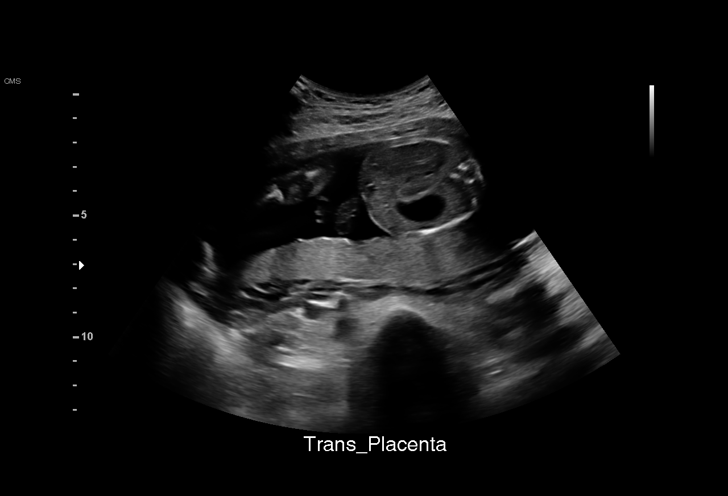
[im 68/102]
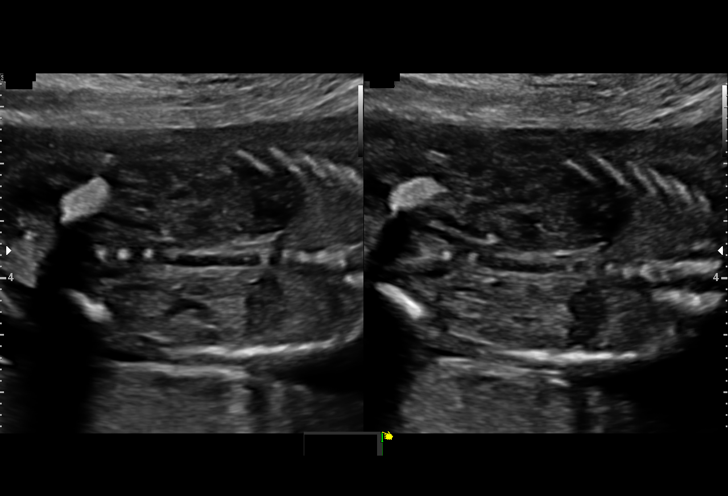
[im 75/102]
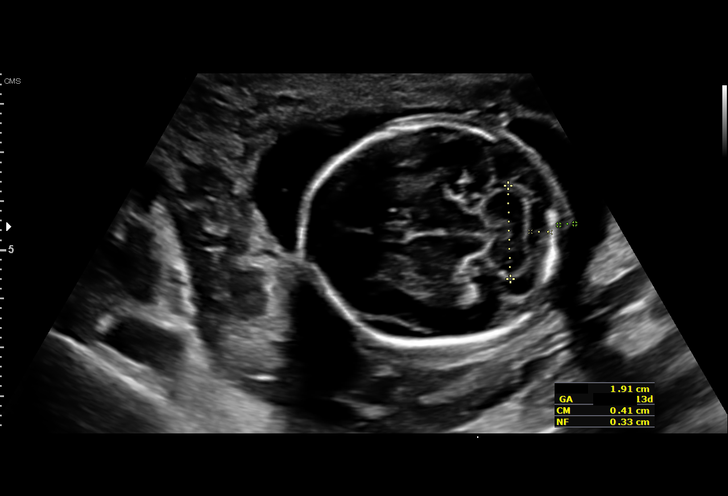
[im 83/102]
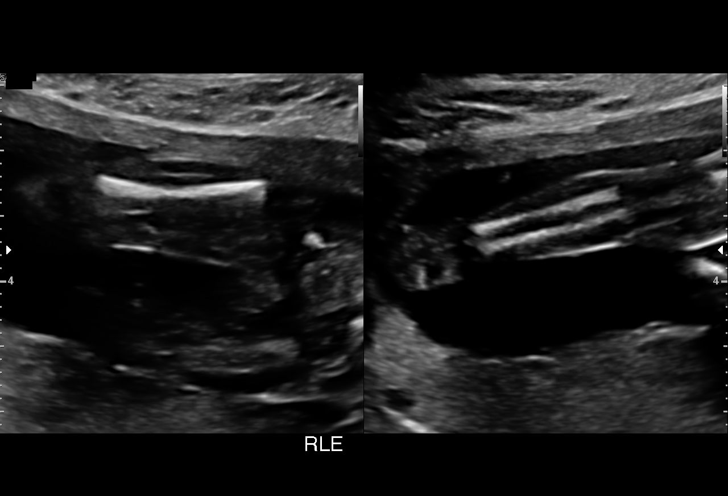
[im 90/102]
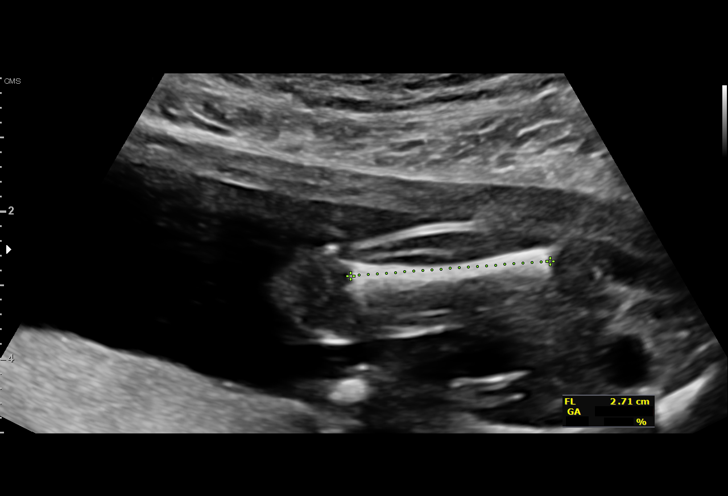
[im 98/102]
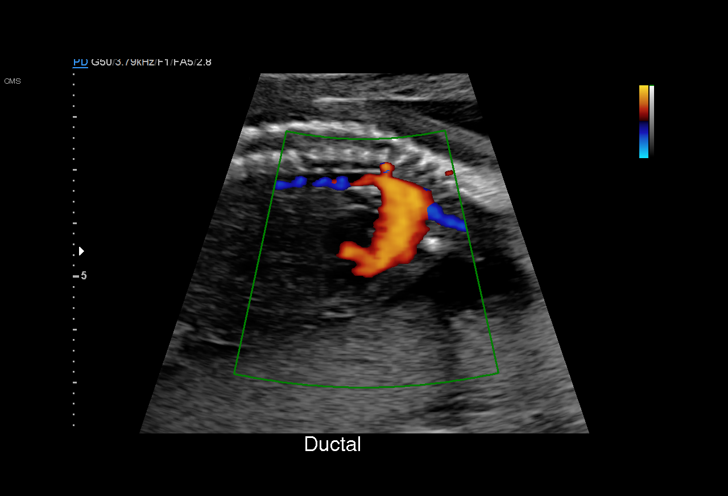

[13 of 28 positions shown; findings below may reference images not displayed]

[REDACTED]care - [HOSPITAL]

Indications

Encounter for antenatal screening for
malformations
Smoking complicating pregnancy, second
trimester (1ppd)
Fetal abnormality - other known or
suspected (bilateral choriod plexus cysts)
19 weeks gestation of pregnancy
Fetal Evaluation

Num Of Fetuses:         1
Fetal Heart Rate(bpm):  144
Cardiac Activity:       Observed
Presentation:           Cephalic
Placenta:               Posterior
P. Cord Insertion:      Visualized

Amniotic Fluid
AFI FV:      Within normal limits

Largest Pocket(cm)
4.98
Biometry

BPD:      44.5  mm     G. Age:  19w 3d         70  %    CI:        74.38   %    70 - 86
FL/HC:      16.4   %    16.1 -
HC:      163.8  mm     G. Age:  19w 1d         49  %    HC/AC:      1.14        1.09 -
AC:      144.2  mm     G. Age:  19w 5d         70  %    FL/BPD:     60.2   %
FL:       26.8  mm     G. Age:  18w 1d         18  %    FL/AC:      18.6   %    20 - 24
HUM:      27.3  mm     G. Age:  18w 5d         42  %
CER:      18.8  mm     G. Age:  18w 3d         32  %
NFT:       3.5  mm

CM:        4.2  mm

Est. FW:     270  gm    0 lb 10 oz      45  %
Gestational Age

LMP:           19w 0d        Date:  08/01/17                 EDD:   05/08/18
U/S Today:     19w 1d                                        EDD:   05/07/18
Best:          19w 0d     Det. By:  LMP  (08/01/17)          EDD:   05/08/18
Anatomy

Cranium:               Appears normal         LVOT:                   Appears normal
Cavum:                 Appears normal         Aortic Arch:            Appears normal
Ventricles:            Appears normal         Ductal Arch:            Appears normal
Choroid Plexus:        Bilateral choroid      Diaphragm:              Appears normal
plexus cysts
Cerebellum:            Appears normal         Stomach:                Appears normal, left
sided
Posterior Fossa:       Appears normal         Abdomen:                Appears normal
Nuchal Fold:           Appears normal         Abdominal Wall:         Appears nml (cord
insert, abd wall)
Face:                  Appears normal         Cord Vessels:           Appears normal (3
(orbits and profile)                           vessel cord)
Lips:                  Appears normal         Kidneys:                Appear normal
Palate:                Appears normal         Bladder:                Appears normal
Thoracic:              Appears normal         Spine:                  Appears normal
Heart:                 Appears normal         Upper Extremities:      Appears normal
(4CH, axis, and
situs)
RVOT:                  Appears normal         Lower Extremities:      Appears normal

Other:  Heels and 5th digit visualized. Open hands visualized. Nasal bone
visualized. 3VV and 3VTV visualized.
Cervix Uterus Adnexa

Cervix
Length:            3.8  cm.
Normal appearance by transabdominal scan.

Uterus
No abnormality visualized.

Left Ovary
Not visualized.

Right Ovary
Not visualized.

Cul De Sac
No free fluid seen.

Adnexa
No abnormality visualized.
Comments

U/S images reviewed. Findings reviewed with patient.
Appropriate fetal growth is noted. Bilateral choroid plexus
cysts are identified.   No other fetal abnormalities are seen.
General counseling was then performed regarding Choroid
Plexus Cyst(s) (CPC).  The association of CPC with Trisomy
18 was explained including mental retardation, various birth
defects and very poor prognosis of this abnormality was
explained.  99.9% will resolve by 28 weeks.  An age related
risk for Trisomy 18 was calculated including Maternal Serum
Screening (MSS) if available.  Her age related risk at 33 (age
at delivery) is 1/264.  NIPT is low risk.

Questions answered.
15 minutes spent face to face with patient.
Recommendations: 1) Follow-up U/S @ 28 weeks
Recommendations

: 1) Follow-up U/S @ 28 weeks

## 2018-12-23 ENCOUNTER — Other Ambulatory Visit: Payer: Self-pay

## 2018-12-23 ENCOUNTER — Ambulatory Visit: Payer: Medicaid Other | Admitting: Obstetrics & Gynecology

## 2018-12-23 ENCOUNTER — Encounter: Payer: Self-pay | Admitting: Obstetrics & Gynecology

## 2018-12-23 VITALS — BP 125/63 | HR 86 | Temp 97.7°F | Ht 63.0 in | Wt 123.9 lb

## 2018-12-23 DIAGNOSIS — Z0000 Encounter for general adult medical examination without abnormal findings: Secondary | ICD-10-CM | POA: Diagnosis not present

## 2018-12-23 DIAGNOSIS — F4322 Adjustment disorder with anxiety: Secondary | ICD-10-CM

## 2018-12-23 DIAGNOSIS — Z30011 Encounter for initial prescription of contraceptive pills: Secondary | ICD-10-CM

## 2018-12-23 MED ORDER — ESCITALOPRAM OXALATE 10 MG PO TABS: 10.0000 mg | ORAL_TABLET | Freq: Every day | ORAL | 6 refills | Status: DC

## 2018-12-23 NOTE — Progress Notes (Signed)
Patient ID: Katherine Barber, female   DOB: 08-13-1985, 33 y.o.   MRN: [id number]  Chief Complaint  Patient presents with  . Gynecologic Exam    AEX    HPI Katherine Barber is a 33 y.o. female.  [id number] Patient's last menstrual period was 12/04/2018 (exact date). She is on an extended-cycle OCP and is doing well. There was a normal pap 10/2017. History of anxiety and she is symptomatic since she has not taken Lexapro since her pregnancy. HPI  Past Medical History:  Diagnosis Date  . Anxiety   . UTI (urinary tract infection)     Past Surgical History:  Procedure Laterality Date  . KNEE ARTHROSCOPY    . NOSE SURGERY    . PALATAL EXPANSION    . WRIST SURGERY      Family History  Problem Relation Age of Onset  . Congestive Heart Failure Father     Social History Social History   Tobacco Use  . Smoking status: Current Every Day Smoker    Packs/day: 1.00  . Smokeless tobacco: Never Used  Substance Use Topics  . Alcohol use: Yes    Comment: socially- not since pregnancy  . Drug use: No    No Known Allergies  Current Outpatient Medications  Medication Sig Dispense Refill  . Levonorgest-Eth Estrad-Fe Bisg (BALCOLTRA) 0.1-20 MG-MCG(21) TABS Take 1 tablet by mouth daily. 84 tablet 4  . acetaminophen (TYLENOL) 500 MG tablet Take 2 tablets (1,000 mg total) by mouth every 6 (six) hours as needed for moderate pain. (Patient not taking: Reported on 05/29/2018) 30 tablet 0  . albuterol (PROVENTIL HFA;VENTOLIN HFA) 108 (90 Base) MCG/ACT inhaler Inhale 2 puffs into the lungs every 6 (six) hours as needed for wheezing or shortness of breath. (Patient not taking: Reported on 05/29/2018) 1 Inhaler 3  . cyclobenzaprine (FLEXERIL) 10 MG tablet Take 1 tablet (10 mg total) by mouth 3 (three) times daily as needed for muscle spasms. (Patient not taking: Reported on 12/23/2018) 30 tablet 2  . escitalopram (LEXAPRO) 10 MG tablet Take 1 tablet (10 mg total) by mouth daily. 30 tablet 6  .  hydrochlorothiazide (HYDRODIURIL) 25 MG tablet Take 1 tablet (25 mg total) by mouth daily. (Patient not taking: Reported on 12/23/2018) 30 tablet 0  . ibuprofen (ADVIL,MOTRIN) 600 MG tablet Take 1 tablet (600 mg total) by mouth every 6 (six) hours. (Patient not taking: Reported on 12/23/2018) 60 tablet 0  . naproxen (NAPROSYN) 500 MG tablet Take 1 tablet (500 mg total) by mouth 2 (two) times daily. (Patient not taking: Reported on 05/29/2018) 30 tablet 0  . Prenatal Vit-Fe Fumarate-FA (PRENATAL MULTIVITAMIN) TABS tablet Take 1 tablet by mouth at bedtime.    . senna-docusate (SENOKOT-S) 8.6-50 MG tablet Take 2 tablets by mouth daily. (Patient not taking: Reported on 05/29/2018) 30 tablet 0   No current facility-administered medications for this visit.     Review of Systems Review of Systems  Constitutional: Negative.   HENT: Negative.   Respiratory: Negative.   Gastrointestinal: Negative.   Genitourinary: Negative.   Neurological: Negative.   Psychiatric/Behavioral: Positive for agitation and sleep disturbance. Negative for suicidal ideas. The patient is nervous/anxious.     Blood pressure 125/63, pulse 86, temperature 97.7 F (36.5 C), height 5\' 3"  (1.6 m), weight 123 lb 14.4 oz (56.2 kg), last menstrual period 12/04/2018, currently breastfeeding.  Physical Exam Physical Exam Vitals signs and nursing note reviewed.  Constitutional:      Appearance: Normal appearance. She is  not ill-appearing.  Cardiovascular:     Rate and Rhythm: Normal rate and regular rhythm.     Heart sounds: Normal heart sounds.  Pulmonary:     Effort: Pulmonary effort is normal.     Breath sounds: Normal breath sounds.  Abdominal:     General: Abdomen is flat. There is no distension.  Neurological:     Mental Status: She is alert.    Breasts: breasts appear normal, no suspicious masses, no skin or nipple changes or axillary nodes.  Data Reviewed Pap 2019  Assessment Well woman exam normal, pap is  up-to-date Anxiety currently off her medication  Plan Refill her Lexapro and OCP Yearly exam recommended She will enroll with another PCP    Emeterio Reeve 12/23/2018, 3:34 PM

## 2018-12-23 NOTE — Patient Instructions (Signed)
Oral Contraception Use Oral contraceptive pills (OCPs) are medicines that you take to prevent pregnancy. OCPs work by:  Preventing the ovaries from releasing eggs.  Thickening mucus in the lower part of the uterus (cervix), which prevents sperm from entering the uterus.  Thinning the lining of the uterus (endometrium), which prevents a fertilized egg from attaching to the endometrium. OCPs are highly effective when taken exactly as prescribed. However, OCPs do not prevent sexually transmitted infections (STIs). Safe sex practices, such as using condoms while on an OCP, can help prevent STIs. Before taking OCPs, you may have a physical exam, blood test, and Pap test. A Pap test involves taking a sample of cells from your cervix to check for cancer. Discuss with your health care provider the possible side effects of the OCP you may be prescribed. When you start an OCP, be aware that it can take 2-3 months for your body to adjust to changes in hormone levels. How to take oral contraceptive pills Follow instructions from your health care provider about how to start taking your first cycle of OCPs. Your health care provider may recommend that you:  Start the pill on day 1 of your menstrual period. If you start at this time, you will not need any backup form of birth control (contraception), such as condoms.  Start the pill on the first Sunday after your menstrual period or on the day you get your prescription. In these cases, you will need to use backup contraception for the first week.  Start the pill at any time of your cycle. ? If you take the pill within 5 days of the start of your period, you will not need a backup form of contraception. ? If you start at any other time of your menstrual cycle, you will need to use another form of contraception for 7 days. If your OCP is the type called a minipill, it will protect you from pregnancy after taking it for 2 days (48 hours), and you can stop using  backup contraception after that time. After you have started taking OCPs:  If you forget to take 1 pill, take it as soon as you remember. Take the next pill at the regular time.  If you miss 2 or more pills, call your health care provider. Different pills have different instructions for missed doses. Use backup birth control until your next menstrual period starts.  If you use a 28-day pack that contains inactive pills and you miss 1 of the last 7 pills (pills with no hormones), throw away the rest of the non-hormone pills and start a new pill pack. No matter which day you start the OCP, you will always start a new pack on that same day of the week. Have an extra pack of OCPs and a backup contraceptive method available in case you miss some pills or lose your OCP pack. Follow these instructions at home:  Do not use any products that contain nicotine or tobacco, such as cigarettes and e-cigarettes. If you need help quitting, ask your health care provider.  Always use a condom to protect against STIs. OCPs do not protect against STIs.  Use a calendar to mark the days of your menstrual period.  Read the information and directions that came with your OCP. Talk to your health care provider if you have questions. Contact a health care provider if:  You develop nausea and vomiting.  You have abnormal vaginal discharge or bleeding.  You develop a rash.    You miss your menstrual period. Depending on the type of OCP you are taking, this may be a sign of pregnancy. Ask your health care provider for more information.  You are losing your hair.  You need treatment for mood swings or depression.  You get dizzy when taking the OCP.  You develop acne after taking the OCP.  You become pregnant or think you may be pregnant.  You have diarrhea, constipation, and abdominal pain or cramps.  You miss 2 or more pills. Get help right away if:  You develop chest pain.  You develop shortness of  breath.  You have an uncontrolled or severe headache.  You develop numbness or slurred speech.  You develop visual or speech problems.  You develop pain, redness, and swelling in your legs.  You develop weakness or numbness in your arms or legs. Summary  Oral contraceptive pills (OCPs) are medicines that you take to prevent pregnancy.  OCPs do not prevent sexually transmitted infections (STIs). Always use a condom to protect against STIs.  When you start an OCP, be aware that it can take 2-3 months for your body to adjust to changes in hormone levels.  Read all the information and directions that come with your OCP. This information is not intended to replace advice given to you by your health care provider. Make sure you discuss any questions you have with your health care provider. Document Released: 02/07/2011 Document Revised: 06/12/2018 Document Reviewed: 04/01/2016 Elsevier Patient Education  2020 [employer] Inc.  

## 2018-12-23 NOTE — Progress Notes (Signed)
GYN presents for AEX.  C/o anxiety.  GAD-7=8

## 2019-01-14 ENCOUNTER — Other Ambulatory Visit: Payer: Self-pay | Admitting: Obstetrics & Gynecology

## 2019-01-14 DIAGNOSIS — F4322 Adjustment disorder with anxiety: Secondary | ICD-10-CM

## 2019-03-10 ENCOUNTER — Other Ambulatory Visit: Payer: Medicaid Other

## 2019-03-10 ENCOUNTER — Ambulatory Visit: Payer: Medicaid Other | Attending: Internal Medicine

## 2019-03-10 DIAGNOSIS — Z20822 Contact with and (suspected) exposure to covid-19: Secondary | ICD-10-CM

## 2019-03-11 LAB — NOVEL CORONAVIRUS, NAA: SARS-CoV-2, NAA: DETECTED — AB

## 2019-04-01 DIAGNOSIS — Z23 Encounter for immunization: Secondary | ICD-10-CM | POA: Diagnosis not present

## 2019-04-03 IMAGING — CR CHEST - 2 VIEW
2 series · 2 of 2 positions shown · non-contrast
Comparison: 04/25/2018

CLINICAL DATA: Chest pain

EXAM:
CHEST - 2 VIEW

[w chest pa]
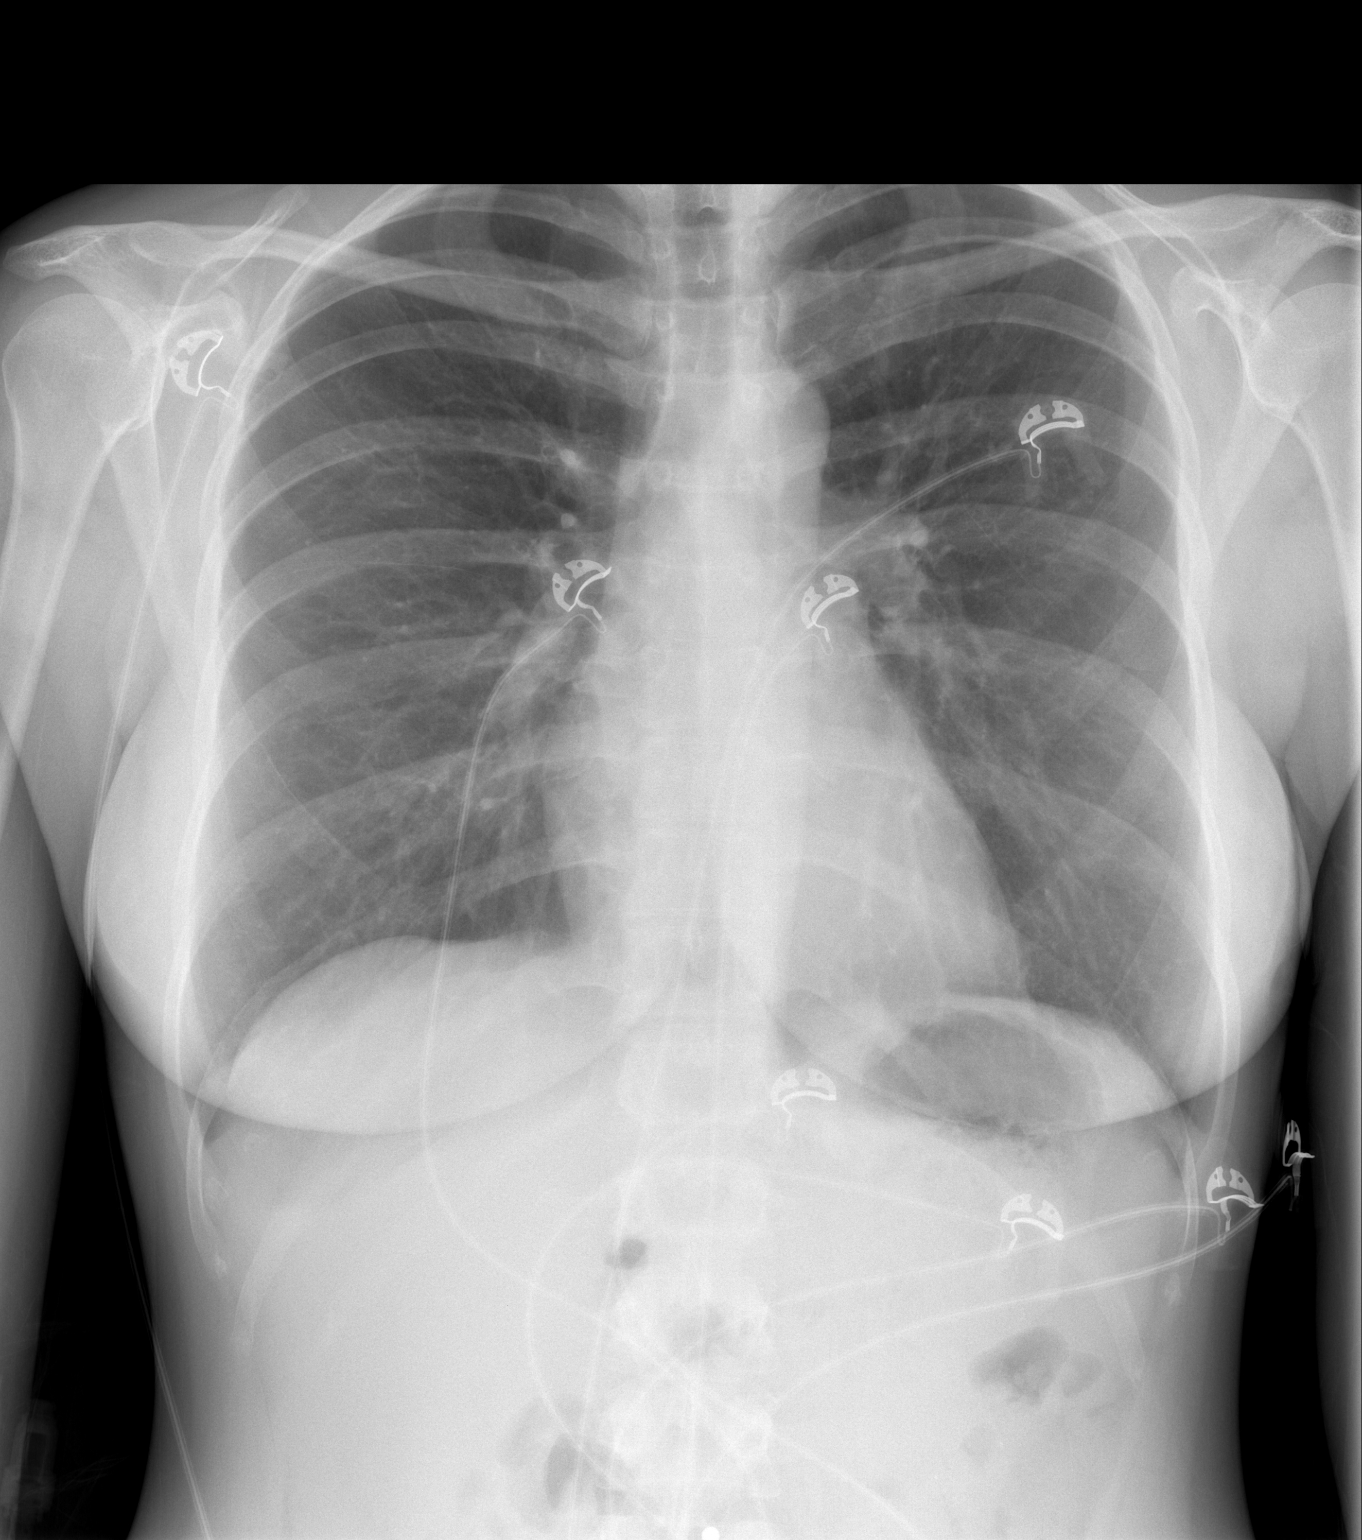

[w chest lat]
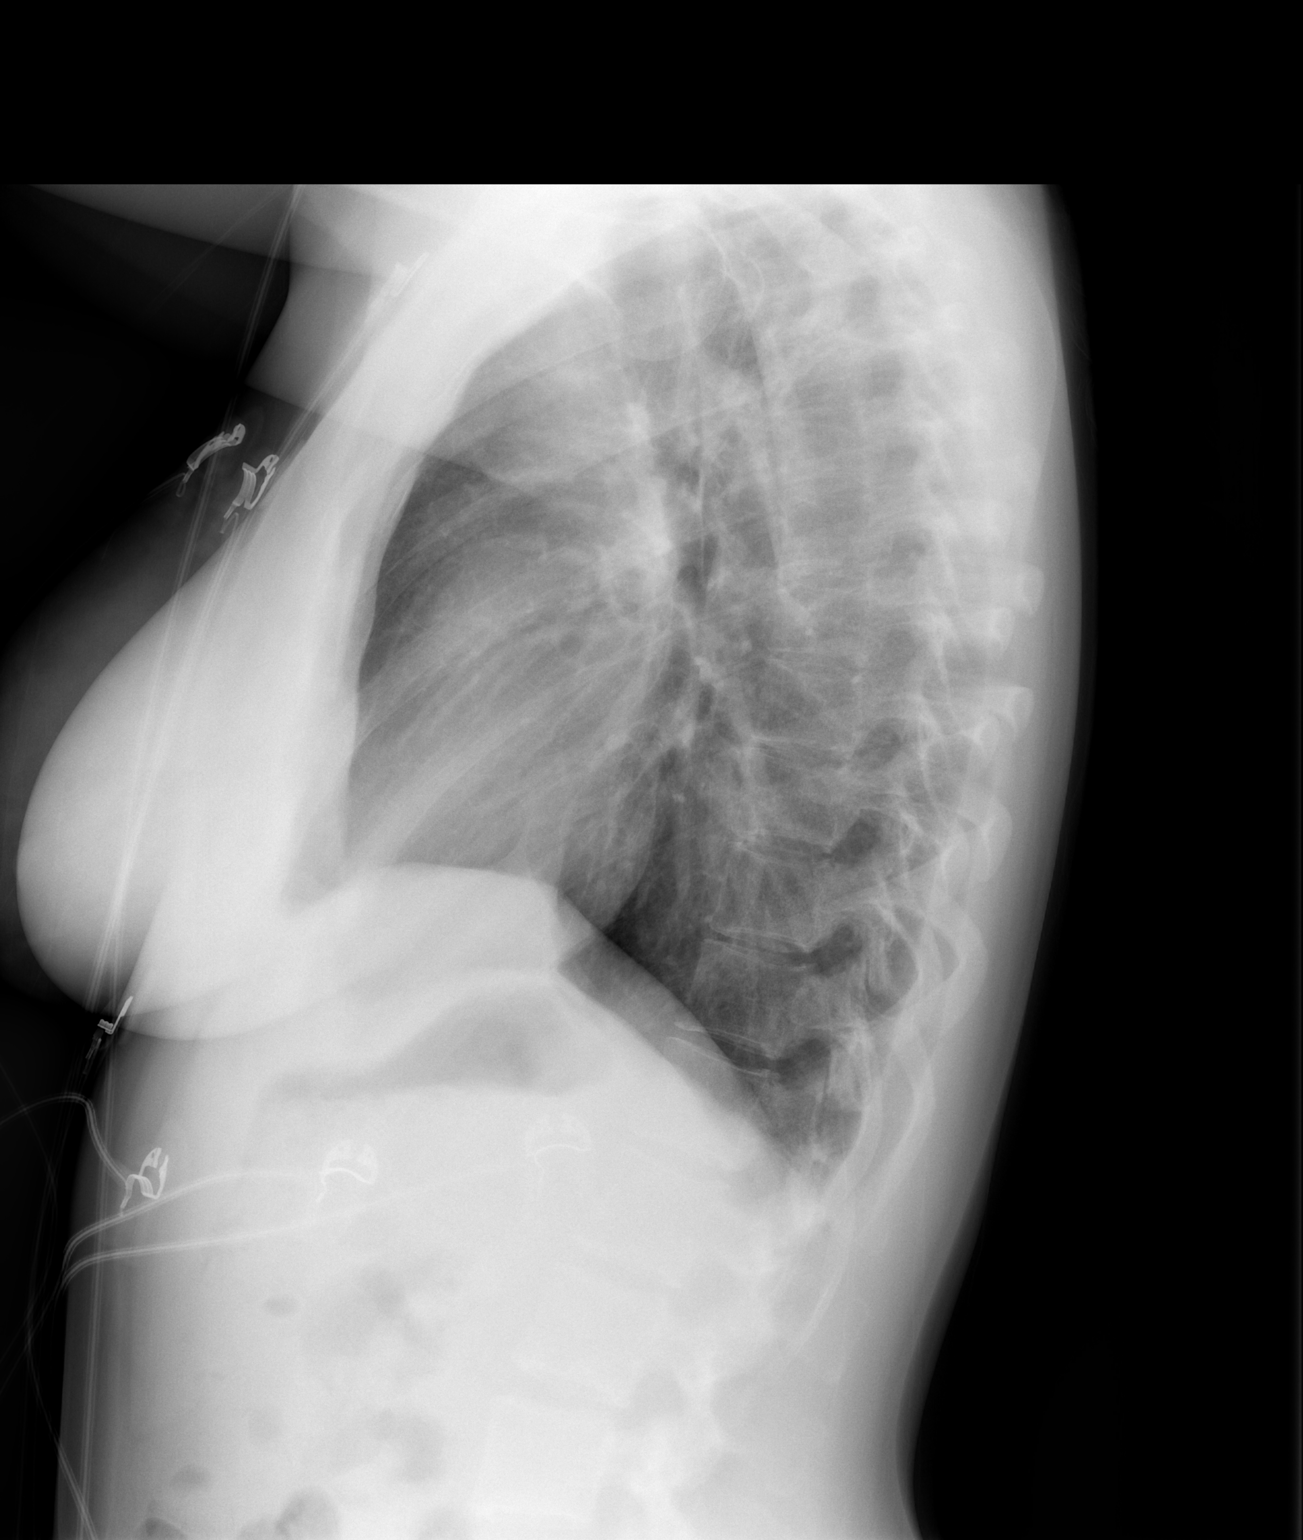

[2 of 2 positions shown; findings below may reference images not displayed]

FINDINGS: The heart size and mediastinal contours are within normal limits.
Both lungs are clear. The visualized skeletal structures are
unremarkable.
IMPRESSION: No active cardiopulmonary disease.

## 2019-06-25 DIAGNOSIS — S46911A Strain of unspecified muscle, fascia and tendon at shoulder and upper arm level, right arm, initial encounter: Secondary | ICD-10-CM | POA: Diagnosis not present

## 2019-07-19 ENCOUNTER — Other Ambulatory Visit: Payer: Self-pay

## 2019-07-20 ENCOUNTER — Ambulatory Visit: Payer: Medicaid Other | Admitting: Physician Assistant

## 2019-07-27 ENCOUNTER — Other Ambulatory Visit: Payer: Self-pay | Admitting: Obstetrics & Gynecology

## 2019-07-27 DIAGNOSIS — F419 Anxiety disorder, unspecified: Secondary | ICD-10-CM | POA: Diagnosis not present

## 2019-07-27 DIAGNOSIS — F321 Major depressive disorder, single episode, moderate: Secondary | ICD-10-CM | POA: Insufficient documentation

## 2019-07-27 DIAGNOSIS — Z79899 Other long term (current) drug therapy: Secondary | ICD-10-CM | POA: Diagnosis not present

## 2019-07-27 DIAGNOSIS — G8929 Other chronic pain: Secondary | ICD-10-CM | POA: Insufficient documentation

## 2019-07-27 DIAGNOSIS — M25511 Pain in right shoulder: Secondary | ICD-10-CM | POA: Diagnosis not present

## 2019-07-27 HISTORY — DX: Other chronic pain: G89.29

## 2019-07-28 ENCOUNTER — Other Ambulatory Visit: Payer: Self-pay | Admitting: *Deleted

## 2019-07-28 MED ORDER — BALCOLTRA 0.1-20 MG-MCG(21) PO TABS: 1.0000 | ORAL_TABLET | Freq: Every day | ORAL | 2 refills | Status: DC

## 2019-07-28 NOTE — Progress Notes (Signed)
Pt called for refill on BC pills.  Refill sent today pre protocol. Pt advised she will need AEX around Oct/Nov.

## 2019-08-04 DIAGNOSIS — E875 Hyperkalemia: Secondary | ICD-10-CM | POA: Diagnosis not present

## 2019-08-04 DIAGNOSIS — D7589 Other specified diseases of blood and blood-forming organs: Secondary | ICD-10-CM | POA: Diagnosis not present

## 2019-08-19 DIAGNOSIS — M7521 Bicipital tendinitis, right shoulder: Secondary | ICD-10-CM | POA: Diagnosis not present

## 2019-08-19 DIAGNOSIS — M25511 Pain in right shoulder: Secondary | ICD-10-CM | POA: Diagnosis not present

## 2019-08-25 DIAGNOSIS — R197 Diarrhea, unspecified: Secondary | ICD-10-CM | POA: Diagnosis not present

## 2019-08-25 DIAGNOSIS — F3341 Major depressive disorder, recurrent, in partial remission: Secondary | ICD-10-CM | POA: Diagnosis not present

## 2019-08-25 DIAGNOSIS — F419 Anxiety disorder, unspecified: Secondary | ICD-10-CM | POA: Diagnosis not present

## 2019-08-25 DIAGNOSIS — R109 Unspecified abdominal pain: Secondary | ICD-10-CM | POA: Diagnosis not present

## 2019-08-26 DIAGNOSIS — R197 Diarrhea, unspecified: Secondary | ICD-10-CM | POA: Diagnosis not present

## 2019-08-31 DIAGNOSIS — R1031 Right lower quadrant pain: Secondary | ICD-10-CM | POA: Diagnosis not present

## 2019-08-31 DIAGNOSIS — R1030 Lower abdominal pain, unspecified: Secondary | ICD-10-CM | POA: Diagnosis not present

## 2019-08-31 DIAGNOSIS — R634 Abnormal weight loss: Secondary | ICD-10-CM | POA: Diagnosis not present

## 2019-08-31 DIAGNOSIS — R1032 Left lower quadrant pain: Secondary | ICD-10-CM | POA: Diagnosis not present

## 2019-08-31 DIAGNOSIS — R197 Diarrhea, unspecified: Secondary | ICD-10-CM | POA: Diagnosis not present

## 2019-09-02 ENCOUNTER — Emergency Department (HOSPITAL_BASED_OUTPATIENT_CLINIC_OR_DEPARTMENT_OTHER): Payer: Medicaid Other

## 2019-09-02 ENCOUNTER — Encounter (HOSPITAL_BASED_OUTPATIENT_CLINIC_OR_DEPARTMENT_OTHER): Payer: Self-pay | Admitting: Emergency Medicine

## 2019-09-02 ENCOUNTER — Emergency Department (HOSPITAL_BASED_OUTPATIENT_CLINIC_OR_DEPARTMENT_OTHER)
Admission: EM | Admit: 2019-09-02 | Discharge: 2019-09-02 | Disposition: A | Discharge status: Home / Self Care | Payer: Medicaid Other | Attending: Emergency Medicine | Admitting: Emergency Medicine

## 2019-09-02 ENCOUNTER — Other Ambulatory Visit: Payer: Self-pay

## 2019-09-02 DIAGNOSIS — F17200 Nicotine dependence, unspecified, uncomplicated: Secondary | ICD-10-CM | POA: Diagnosis not present

## 2019-09-02 DIAGNOSIS — R197 Diarrhea, unspecified: Secondary | ICD-10-CM | POA: Insufficient documentation

## 2019-09-02 DIAGNOSIS — R109 Unspecified abdominal pain: Secondary | ICD-10-CM | POA: Diagnosis not present

## 2019-09-02 DIAGNOSIS — I700 Atherosclerosis of aorta: Secondary | ICD-10-CM | POA: Diagnosis not present

## 2019-09-02 DIAGNOSIS — R1084 Generalized abdominal pain: Secondary | ICD-10-CM | POA: Diagnosis not present

## 2019-09-02 LAB — CBC WITH DIFFERENTIAL/PLATELET
Abs Immature Granulocytes: 0.04 10*3/uL (ref 0.00–0.07)
Basophils Absolute: 0.1 10*3/uL (ref 0.0–0.1)
Basophils Relative: 0 %
Eosinophils Absolute: 0.2 10*3/uL (ref 0.0–0.5)
Eosinophils Relative: 1 %
HCT: 42.6 % (ref 36.0–46.0)
Hemoglobin: 14.7 g/dL (ref 12.0–15.0)
Immature Granulocytes: 0 %
Lymphocytes Relative: 23 %
Lymphs Abs: 2.6 10*3/uL (ref 0.7–4.0)
MCH: 34.5 pg — ABNORMAL HIGH (ref 26.0–34.0)
MCHC: 34.5 g/dL (ref 30.0–36.0)
MCV: 100 fL (ref 80.0–100.0)
Monocytes Absolute: 0.6 10*3/uL (ref 0.1–1.0)
Monocytes Relative: 5 %
Neutro Abs: 7.8 10*3/uL — ABNORMAL HIGH (ref 1.7–7.7)
Neutrophils Relative %: 71 %
Platelets: 217 10*3/uL (ref 150–400)
RBC: 4.26 MIL/uL (ref 3.87–5.11)
RDW: 12.1 % (ref 11.5–15.5)
WBC: 11.3 10*3/uL — ABNORMAL HIGH (ref 4.0–10.5)
nRBC: 0 % (ref 0.0–0.2)

## 2019-09-02 LAB — COMPREHENSIVE METABOLIC PANEL
ALT: 20 U/L (ref 0–44)
AST: 25 U/L (ref 15–41)
Albumin: 4.4 g/dL (ref 3.5–5.0)
Alkaline Phosphatase: 63 U/L (ref 38–126)
Anion gap: 10 (ref 5–15)
BUN: 14 mg/dL (ref 6–20)
CO2: 24 mmol/L (ref 22–32)
Calcium: 8.9 mg/dL (ref 8.9–10.3)
Chloride: 106 mmol/L (ref 98–111)
Creatinine, Ser: 0.82 mg/dL (ref 0.44–1.00)
GFR calc Af Amer: >60 mL/min (ref >60)
GFR calc non Af Amer: >60 mL/min (ref >60)
Glucose, Bld: 85 mg/dL (ref 70–99)
Potassium: 3.4 mmol/L — ABNORMAL LOW (ref 3.5–5.1)
Sodium: 140 mmol/L (ref 135–145)
Total Bilirubin: 0.8 mg/dL (ref 0.3–1.2)
Total Protein: 6.9 g/dL (ref 6.5–8.1)

## 2019-09-02 LAB — LIPASE, BLOOD: Lipase: 26 U/L (ref 11–51)

## 2019-09-02 LAB — MAGNESIUM: Magnesium: 1.9 mg/dL (ref 1.7–2.4)

## 2019-09-02 LAB — HCG, SERUM, QUALITATIVE: Preg, Serum: NEGATIVE

## 2019-09-02 MED ORDER — ONDANSETRON HCL 4 MG/2ML IJ SOLN
4.0000 mg | Freq: Once | INTRAMUSCULAR | Status: AC
  Administered 2019-09-02: 4 mg via INTRAVENOUS
  Filled 2019-09-02: qty 2

## 2019-09-02 MED ORDER — MORPHINE SULFATE (PF) 4 MG/ML IV SOLN
4.0000 mg | Freq: Once | INTRAVENOUS | Status: AC
  Administered 2019-09-02: 4 mg via INTRAVENOUS
  Filled 2019-09-02: qty 1

## 2019-09-02 MED ORDER — PEPTO-BISMOL 524 MG/30ML PO SUSP: 30.0000 mL | Freq: Four times a day (QID) | ORAL | 0 refills | Status: DC | PRN

## 2019-09-02 MED ORDER — IOHEXOL 300 MG/ML  SOLN
100.0000 mL | Freq: Once | INTRAMUSCULAR | Status: AC | PRN
  Administered 2019-09-02: 100 mL via INTRAVENOUS

## 2019-09-02 MED ORDER — LOPERAMIDE HCL 2 MG PO CAPS: 2.0000 mg | ORAL_CAPSULE | Freq: Four times a day (QID) | ORAL | 0 refills | Status: DC | PRN

## 2019-09-02 MED ORDER — ONDANSETRON 4 MG PO TBDP: 4.0000 mg | ORAL_TABLET | Freq: Three times a day (TID) | ORAL | 0 refills | Status: DC | PRN

## 2019-09-02 MED ORDER — POTASSIUM CHLORIDE CRYS ER 20 MEQ PO TBCR: 20.0000 meq | EXTENDED_RELEASE_TABLET | Freq: Every day | ORAL | 0 refills | Status: DC

## 2019-09-02 MED ORDER — POTASSIUM CHLORIDE CRYS ER 20 MEQ PO TBCR
40.0000 meq | EXTENDED_RELEASE_TABLET | Freq: Once | ORAL | Status: AC
  Administered 2019-09-02: 40 meq via ORAL
  Filled 2019-09-02: qty 2

## 2019-09-02 MED ORDER — SODIUM CHLORIDE 0.9 % IV BOLUS
1000.0000 mL | Freq: Once | INTRAVENOUS | Status: AC
  Administered 2019-09-02: 1000 mL via INTRAVENOUS

## 2019-09-02 NOTE — ED Notes (Signed)
ED Provider at bedside. 

## 2019-09-02 NOTE — ED Provider Notes (Signed)
[facility] EMERGENCY DEPARTMENT Provider Note   CSN: [id number] Arrival date & time: 09/02/19  1121     History Chief Complaint  Patient presents with  . Diarrhea    Katherine Barber is a 34 y.o. female with a history of anxiety and tobacco abuse who presents to the emergency department with complaints continued diarrhea x 2 weeks. Patient states she is having up to 10 episodes of watery brown to orange colored diarrhea per day with associated intermittent abdominal cramping, abdominal bloating, and nausea. She states whenever she eats anything she immediately feels full with abdominal discomfort & diarrhea. She has seen PCP for this, had multiple tests as detailed below, and is pending outpatient CT A/P with referral to GI scheduled 09/10/19. She states she is feeling miserable and they are having issues authorizing the CT outpatient prompting the visit today.  She initially trialed Bentyl without relief, recently started on high Cosamin which does not seem to be helping much either.  She denies fever, chills, melena, hematochezia, bright red blood per rectum, vomiting, dysuria, vaginal bleeding, vaginal discharge, or syncope.  Denies recent foreign travel, hospitalizations, or antibiotics.   Per chart review for additional history, seen by PCP 08/26/2019 as well as 08/31/2019 for same complaints.  She has had basic labs that were a bit concerning for dehydration.  She had stool studies as well as C. difficile and Giardia which were negative, stool culture negative for Salmonella, Shigella, Campylobacter, and E. coli Shiga toxin assay, and O&P negative as well.  Currently prescribed hycosamine, plan is for CT abdomen pelvis with IV contrast outpatient as well as referral to gastroenterology as per patient history.   HPI     Past Medical History:  Diagnosis Date  . Anxiety   . UTI (urinary tract infection)     There are no problems to display for this patient.   Past Surgical  History:  Procedure Laterality Date  . KNEE ARTHROSCOPY    . NOSE SURGERY    . PALATAL EXPANSION    . WRIST SURGERY       OB History    Gravida  2   Para  1   Term  1   Preterm  0   AB  1   Living  1     SAB  1   TAB  0   Ectopic  0   Multiple  0   Live Births  1           Family History  Problem Relation Age of Onset  . Congestive Heart Failure Father     Social History   Tobacco Use  . Smoking status: Current Every Day Smoker    Packs/day: 1.00  . Smokeless tobacco: Never Used  Vaping Use  . Vaping Use: Never used  Substance Use Topics  . Alcohol use: Yes    Comment: socially- not since pregnancy  . Drug use: No    Home Medications Prior to Admission medications   Medication Sig Start Date End Date Taking? Authorizing Provider  acetaminophen (TYLENOL) 500 MG tablet Take 2 tablets (1,000 mg total) by mouth every 6 (six) hours as needed for moderate pain. Patient not taking: Reported on 05/29/2018 08/28/17   Little, Ambrose Finland, MD  albuterol (PROVENTIL HFA;VENTOLIN HFA) 108 (90 Base) MCG/ACT inhaler Inhale 2 puffs into the lungs every 6 (six) hours as needed for wheezing or shortness of breath. Patient not taking: Reported on 05/29/2018 04/28/18   Leftwich-Kirby,  Lisa A, CNM  cyclobenzaprine (FLEXERIL) 10 MG tablet Take 1 tablet (10 mg total) by mouth 3 (three) times daily as needed for muscle spasms. Patient not taking: Reported on 12/23/2018 04/20/18   Constant, Peggy, MD  escitalopram (LEXAPRO) 10 MG tablet TAKE 1 TABLET BY MOUTH EVERY DAY 01/15/19   Adam Phenix, MD  hydrochlorothiazide (HYDRODIURIL) 25 MG tablet Take 1 tablet (25 mg total) by mouth daily. Patient not taking: Reported on 12/23/2018 05/22/18   Constant, Peggy, MD  ibuprofen (ADVIL,MOTRIN) 600 MG tablet Take 1 tablet (600 mg total) by mouth every 6 (six) hours. Patient not taking: Reported on 12/23/2018 05/08/18   Mullis, Penni Homans P, DO  Levonorgest-Eth Estrad-Fe Bisg (BALCOLTRA)  0.1-20 MG-MCG(21) TABS Take 1 tablet by mouth daily. 07/28/19   Adam Phenix, MD  naproxen (NAPROSYN) 500 MG tablet Take 1 tablet (500 mg total) by mouth 2 (two) times daily. Patient not taking: Reported on 05/29/2018 05/15/18   Henderly, Britni A, PA-C  Prenatal Vit-Fe Fumarate-FA (PRENATAL MULTIVITAMIN) TABS tablet Take 1 tablet by mouth at bedtime.    [provider]  senna-docusate (SENOKOT-S) 8.6-50 MG tablet Take 2 tablets by mouth daily. Patient not taking: Reported on 05/29/2018 05/09/18   Orpah Cobb P, DO    Allergies    Patient has no known allergies.  Review of Systems   Review of Systems  Constitutional: Negative for chills and fever.  Respiratory: Negative for shortness of breath.   Cardiovascular: Negative for chest pain.  Gastrointestinal: Positive for abdominal distention (bloating), abdominal pain, diarrhea and nausea. Negative for anal bleeding, blood in stool, constipation and vomiting.  Genitourinary: Negative for dysuria, vaginal bleeding and vaginal discharge.  Neurological: Negative for syncope.  All other systems reviewed and are negative.   Physical Exam Updated Vital Signs BP 118/77 (BP Location: Right Arm)   Pulse 88   Temp 99.1 F (37.3 C) (Oral)   Resp 16   Ht 5\' 3"  (1.6 m)   Wt 56.2 kg   LMP 08/18/2019   SpO2 100%   BMI 21.97 kg/m   Physical Exam Vitals and nursing note reviewed.  Constitutional:      General: She is not in acute distress.    Appearance: She is well-developed. She is not toxic-appearing.  HENT:     Head: Normocephalic and atraumatic.  Eyes:     General:        Right eye: No discharge.        Left eye: No discharge.     Conjunctiva/sclera: Conjunctivae normal.  Cardiovascular:     Rate and Rhythm: Normal rate and regular rhythm.  Pulmonary:     Effort: Pulmonary effort is normal. No respiratory distress.     Breath sounds: Normal breath sounds. No wheezing, rhonchi or rales.  Abdominal:     General: There  is no distension.     Palpations: Abdomen is soft.     Tenderness: There is abdominal tenderness (mild generalized, worse in lower abdomen). There is no guarding or rebound.  Musculoskeletal:     Cervical back: Neck supple.  Skin:    General: Skin is warm and dry.     Findings: No rash.  Neurological:     Mental Status: She is alert.     Comments: Clear speech.   Psychiatric:        Behavior: Behavior normal.     ED Results / Procedures / Treatments   Labs (all labs ordered are listed, but only abnormal results  are displayed) Labs Reviewed  COMPREHENSIVE METABOLIC PANEL - Abnormal; Notable for the following components:      Result Value   Potassium 3.4 (*)    All other components within normal limits  CBC WITH DIFFERENTIAL/PLATELET - Abnormal; Notable for the following components:   WBC 11.3 (*)    MCH 34.5 (*)    Neutro Abs 7.8 (*)    All other components within normal limits  LIPASE, BLOOD  HCG, SERUM, QUALITATIVE  MAGNESIUM    EKG None  Radiology CT Abdomen Pelvis W Contrast  Result Date: 09/02/2019 CLINICAL DATA:  [age] year old female with history of abdominal pain and diarrhea for the past 2 weeks. EXAM: CT ABDOMEN AND PELVIS WITH CONTRAST TECHNIQUE: Multidetector CT imaging of the abdomen and pelvis was performed using the standard protocol following bolus administration of intravenous contrast. CONTRAST:  100mL OMNIPAQUE IOHEXOL 300 MG/ML  SOLN COMPARISON:  No priors. FINDINGS: Lower chest: Unremarkable. Hepatobiliary: Several subcentimeter low-attenuation lesions are noted scattered throughout the hepatic parenchyma, too small to characterize, but statistically likely to represent tiny cysts or biliary hamartomas. No other suspicious cystic or solid hepatic lesions. No intra or extrahepatic biliary ductal dilatation. Gallbladder is normal in appearance. Pancreas: No pancreatic mass. No pancreatic ductal dilatation. No pancreatic or peripancreatic fluid collections or  inflammatory changes. Spleen: Unremarkable. Adrenals/Urinary Tract: Subcentimeter low-attenuation lesion in the right kidney, too small to characterize, but statistically likely to represent a tiny cyst. Left kidney and bilateral adrenal glands are normal in appearance. No hydroureteronephrosis. Urinary bladder is normal in appearance. Stomach/Bowel: Normal appearance of the stomach. No pathologic dilatation of small bowel or colon. Normal appendix. Vascular/Lymphatic: Aortic atherosclerosis, without evidence of aneurysm or dissection in the abdominal or pelvic vasculature. No lymphadenopathy noted in the abdomen or pelvis. Reproductive: Uterus and ovaries are unremarkable in appearance. Other: No significant volume of ascites.  No pneumoperitoneum. 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 There are no aggressive appearing lytic or blastic lesions noted in the visualized portions of the skeleton. IMPRESSION: 1. No acute findings are noted in the abdomen or pelvis to account for the patient's symptoms. 2. Aortic atherosclerosis. Electronically Signed   By: Trudie Reedaniel  Entrikin M.D.   On: 09/02/2019 14:23    Procedures Procedures (including critical care time)  Medications Ordered in ED Medications  potassium chloride SA (KLOR-CON) CR tablet 40 mEq (has no administration in time range)  sodium chloride 0.9 % bolus 1,000 mL (0 mLs Intravenous Stopped 09/02/19 1343)  ondansetron (ZOFRAN) injection 4 mg (4 mg Intravenous Given 09/02/19 1221)  morphine 4 MG/ML injection 4 mg (4 mg Intravenous Given 09/02/19 1221)  iohexol (OMNIPAQUE) 300 MG/ML solution 100 mL (100 mLs Intravenous Contrast Given 09/02/19 1258)    ED Course  I have reviewed the triage vital signs and the nursing notes.  Pertinent labs & imaging results that were available during my care of the patient were reviewed by me and considered in my medical decision making (see chart for details).    MDM Rules/Calculators/A&P                          Patient presents to the ED with complaints of diarrhea x 2 weeks.  Nontoxic, resting comfortably, vitals WNL.  Exam w/ generalized abdominal tenderness worse in the lower quadrants, no peritoneal signs.  DDx: Colitis, diverticulitis, IBD, IBS, viral GI illness.  Feel that C. Difficile and or ova/parasitic infection are less likely given recent stool  studies.  Additional history obtained:  Additional history obtained from nursing note & chart review. Previous records obtained and reviewed as per above information.   Lab Tests:  I Ordered, reviewed, and interpreted labs, which included:  CBC: Mild leukocytosis at 11.3.  No significant anemia. CMP: Mild hypokalemia at 3.4.  No significant electrolyte derangement.  Renal function and LFTs are within normal limits Lipase: Within normal limits Pregnancy test: Negative  Imaging Studies ordered:  I ordered imaging studies which included CT A/P, I independently visualized and interpreted imaging which showed 1. No acute findings are noted in the abdomen or pelvis to account for the patient's symptoms. 2. Aortic atherosclerosis.  ED Course:  Work-up in the emergency department is overall reassuring.  She does not have any significant abnormalities on her labs, we will orally replace her potassium.  The CT scan of her abdomen/pelvis does not show any acute process, no signs of colitis, diverticulitis, perforation, obstruction, intra-abdominal mass, or intra-abdominal abscess.  Her serial abdominal exams remain without peritoneal signs.  She is tolerating p.o.  She recently had several different types of stool studies done and as her symptoms have not significantly changed do not feel these need repeating today.  We will try additional symptomatic management pending her evaluation by gastroenterology. I discussed results, treatment plan, need for follow-up, and return precautions with the patient. Provided opportunity for questions, patient  confirmed understanding and is in agreement with plan.   Portions of this note were generated with Scientist, clinical (histocompatibility and immunogenetics). Dictation errors may occur despite best attempts at proofreading.  Final Clinical Impression(s) / ED Diagnoses Final diagnoses:  Diarrhea, unspecified type    Rx / DC Orders ED Discharge Orders         Ordered    loperamide (IMODIUM) 2 MG capsule  4 times daily PRN     Discontinue  Reprint     09/02/19 1510    bismuth subsalicylate (PEPTO-BISMOL) 262 MG/15ML suspension  Every 6 hours PRN     Discontinue  Reprint     09/02/19 1510    ondansetron (ZOFRAN ODT) 4 MG disintegrating tablet  Every 8 hours PRN     Discontinue  Reprint     09/02/19 1510    potassium chloride SA (KLOR-CON) 20 MEQ tablet  Daily     Discontinue  Reprint     09/02/19 1510           Herby Amick, Pleas Koch, PA-C 09/02/19 1512    Vanetta Mulders, MD 09/03/19 1217

## 2019-09-02 NOTE — ED Notes (Signed)
Patient transported to CT 

## 2019-09-02 NOTE — Discharge Instructions (Signed)
You were seen in the emergency department today for abdominal pain and diarrhea.  Your work-up was overall reassuring.  Your labs show that your potassium is slightly low therefore we are sending you home with a supplement with this for a few days.  Your CT scan did not show an obvious cause of your symptoms.  We are sending you home with the following medicines to try to help with your symptoms in addition to your high Cosamin:  -Zofran: Take every 8 hours as needed for nausea and vomiting -Imodium: Take 4 times per day as needed for diarrhea -Bismuth subsalicylate: Take 4 times per day as needed for stomach upset and diarrhea.  We have prescribed you new medication(s) today. Discuss the medications prescribed today with your pharmacist as they can have adverse effects and interactions with your other medicines including over the counter and prescribed medications. Seek medical evaluation if you start to experience new or abnormal symptoms after taking one of these medicines, seek care immediately if you start to experience difficulty breathing, feeling of your throat closing, facial swelling, or rash as these could be indications of a more serious allergic reaction  Please follow-up with your GI doctor as scheduled on 9 July, we have also provided information for GI within the [facility] health system should you be able to be seen sooner by them.  Return to the ER for new or worsening symptoms including but not limited to increased pain, inability to keep fluids down, blood in your stool, passing out, fever, or any other concerns.

## 2019-09-02 NOTE — ED Triage Notes (Signed)
Pt reports watery diarrhea x 2 weeks up to 10x per day. Has seen PCP and had blood work, stool sample. States she has not gotten results.

## 2019-09-03 DIAGNOSIS — R1011 Right upper quadrant pain: Secondary | ICD-10-CM | POA: Diagnosis not present

## 2019-09-03 DIAGNOSIS — R197 Diarrhea, unspecified: Secondary | ICD-10-CM | POA: Diagnosis not present

## 2019-09-03 DIAGNOSIS — K589 Irritable bowel syndrome without diarrhea: Secondary | ICD-10-CM | POA: Insufficient documentation

## 2019-09-03 DIAGNOSIS — R142 Eructation: Secondary | ICD-10-CM | POA: Diagnosis not present

## 2019-09-03 DIAGNOSIS — R1013 Epigastric pain: Secondary | ICD-10-CM | POA: Diagnosis not present

## 2019-09-03 HISTORY — DX: Irritable bowel syndrome, unspecified: K58.9

## 2019-09-07 DIAGNOSIS — R197 Diarrhea, unspecified: Secondary | ICD-10-CM | POA: Diagnosis not present

## 2019-09-07 DIAGNOSIS — R1030 Lower abdominal pain, unspecified: Secondary | ICD-10-CM | POA: Diagnosis not present

## 2019-09-07 DIAGNOSIS — Z09 Encounter for follow-up examination after completed treatment for conditions other than malignant neoplasm: Secondary | ICD-10-CM | POA: Diagnosis not present

## 2019-09-08 DIAGNOSIS — R197 Diarrhea, unspecified: Secondary | ICD-10-CM | POA: Diagnosis not present

## 2019-09-20 DIAGNOSIS — R1031 Right lower quadrant pain: Secondary | ICD-10-CM | POA: Diagnosis not present

## 2019-09-20 DIAGNOSIS — R1011 Right upper quadrant pain: Secondary | ICD-10-CM | POA: Diagnosis not present

## 2019-09-30 DIAGNOSIS — M24811 Other specific joint derangements of right shoulder, not elsewhere classified: Secondary | ICD-10-CM | POA: Diagnosis not present

## 2019-09-30 DIAGNOSIS — M7521 Bicipital tendinitis, right shoulder: Secondary | ICD-10-CM | POA: Diagnosis not present

## 2019-10-06 DIAGNOSIS — M19011 Primary osteoarthritis, right shoulder: Secondary | ICD-10-CM | POA: Diagnosis not present

## 2019-10-06 DIAGNOSIS — M7521 Bicipital tendinitis, right shoulder: Secondary | ICD-10-CM | POA: Diagnosis not present

## 2019-10-06 DIAGNOSIS — M24811 Other specific joint derangements of right shoulder, not elsewhere classified: Secondary | ICD-10-CM | POA: Diagnosis not present

## 2019-10-21 DIAGNOSIS — R600 Localized edema: Secondary | ICD-10-CM | POA: Diagnosis not present

## 2019-10-21 DIAGNOSIS — M24811 Other specific joint derangements of right shoulder, not elsewhere classified: Secondary | ICD-10-CM | POA: Diagnosis not present

## 2019-10-21 DIAGNOSIS — M79605 Pain in left leg: Secondary | ICD-10-CM | POA: Diagnosis not present

## 2019-11-03 DIAGNOSIS — K52838 Other microscopic colitis: Secondary | ICD-10-CM | POA: Diagnosis not present

## 2019-11-18 DIAGNOSIS — M24811 Other specific joint derangements of right shoulder, not elsewhere classified: Secondary | ICD-10-CM | POA: Diagnosis not present

## 2019-11-18 DIAGNOSIS — M79605 Pain in left leg: Secondary | ICD-10-CM | POA: Diagnosis not present

## 2019-11-22 DIAGNOSIS — M7521 Bicipital tendinitis, right shoulder: Secondary | ICD-10-CM

## 2019-11-22 DIAGNOSIS — M24811 Other specific joint derangements of right shoulder, not elsewhere classified: Secondary | ICD-10-CM | POA: Insufficient documentation

## 2019-11-22 HISTORY — DX: Bicipital tendinitis, right shoulder: M75.21

## 2019-11-29 DIAGNOSIS — Z20822 Contact with and (suspected) exposure to covid-19: Secondary | ICD-10-CM | POA: Diagnosis not present

## 2019-11-29 DIAGNOSIS — Z01812 Encounter for preprocedural laboratory examination: Secondary | ICD-10-CM | POA: Diagnosis not present

## 2019-12-01 DIAGNOSIS — M7521 Bicipital tendinitis, right shoulder: Secondary | ICD-10-CM | POA: Diagnosis not present

## 2019-12-01 DIAGNOSIS — G8918 Other acute postprocedural pain: Secondary | ICD-10-CM | POA: Diagnosis not present

## 2019-12-01 HISTORY — PX: SHOULDER SURGERY: SHX246

## 2020-01-26 DIAGNOSIS — K52832 Lymphocytic colitis: Secondary | ICD-10-CM | POA: Diagnosis not present

## 2020-02-29 DIAGNOSIS — R059 Cough, unspecified: Secondary | ICD-10-CM | POA: Diagnosis not present

## 2020-02-29 DIAGNOSIS — J029 Acute pharyngitis, unspecified: Secondary | ICD-10-CM | POA: Diagnosis not present

## 2020-02-29 DIAGNOSIS — R0981 Nasal congestion: Secondary | ICD-10-CM | POA: Diagnosis not present

## 2020-03-17 DIAGNOSIS — Z4789 Encounter for other orthopedic aftercare: Secondary | ICD-10-CM | POA: Diagnosis not present

## 2020-04-14 DIAGNOSIS — I8393 Asymptomatic varicose veins of bilateral lower extremities: Secondary | ICD-10-CM | POA: Diagnosis not present

## 2020-04-14 DIAGNOSIS — Z9889 Other specified postprocedural states: Secondary | ICD-10-CM | POA: Diagnosis not present

## 2020-04-14 DIAGNOSIS — F411 Generalized anxiety disorder: Secondary | ICD-10-CM | POA: Diagnosis not present

## 2020-04-14 DIAGNOSIS — H539 Unspecified visual disturbance: Secondary | ICD-10-CM | POA: Diagnosis not present

## 2020-04-14 DIAGNOSIS — F320 Major depressive disorder, single episode, mild: Secondary | ICD-10-CM | POA: Diagnosis not present

## 2020-04-14 DIAGNOSIS — M25511 Pain in right shoulder: Secondary | ICD-10-CM | POA: Diagnosis not present

## 2020-05-04 ENCOUNTER — Other Ambulatory Visit: Payer: Self-pay | Admitting: Obstetrics & Gynecology

## 2020-07-21 IMAGING — CT CT ABD-PELV W/ CM
2 series · 10 of 42 positions shown, 11 images · IV contrast (Omnipaque)
Comparison: No priors.

CLINICAL DATA: 34-year-old female with history of abdominal pain
and diarrhea for the past 2 weeks.

EXAM:
CT ABDOMEN AND PELVIS WITH CONTRAST
TECHNIQUE: Multidetector CT imaging of the abdomen and pelvis was performed
using the standard protocol following bolus administration of
intravenous contrast.
CONTRAST:  100mL OMNIPAQUE IOHEXOL 300 MG/ML  SOLN

[Series 5: coronal st · coronal · 0.68mm/px · 3 of 72 slices shown]
[im 32/72  soft-tissue]
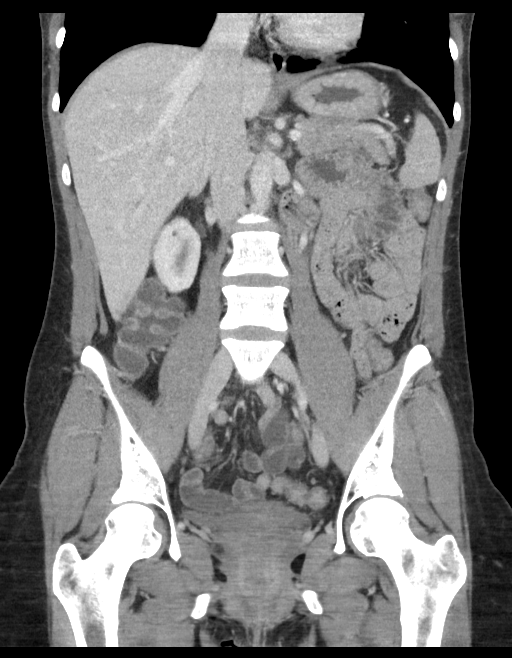
[im 33/72  soft-tissue]
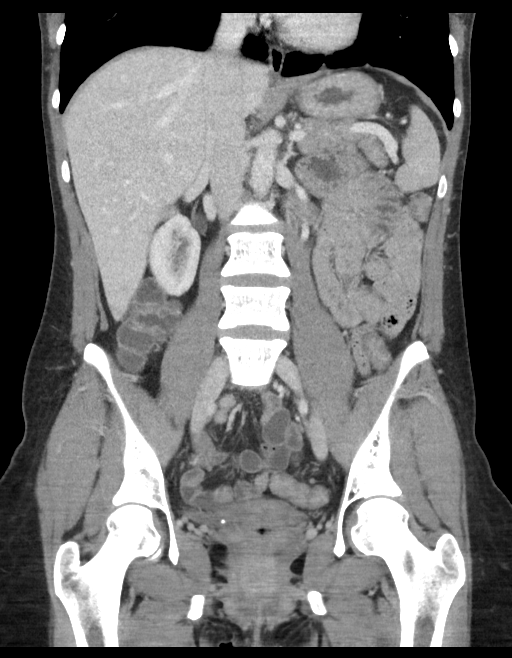
[im 39/72  soft-tissue]
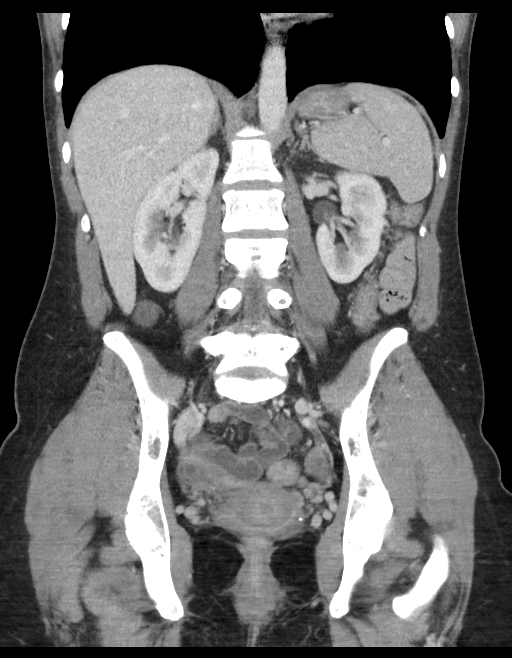

[Series 6: sagittal st · sagittal · 0.46mm/px · 7 of 115 slices shown, 8 images]
[im 13/115  soft-tissue]
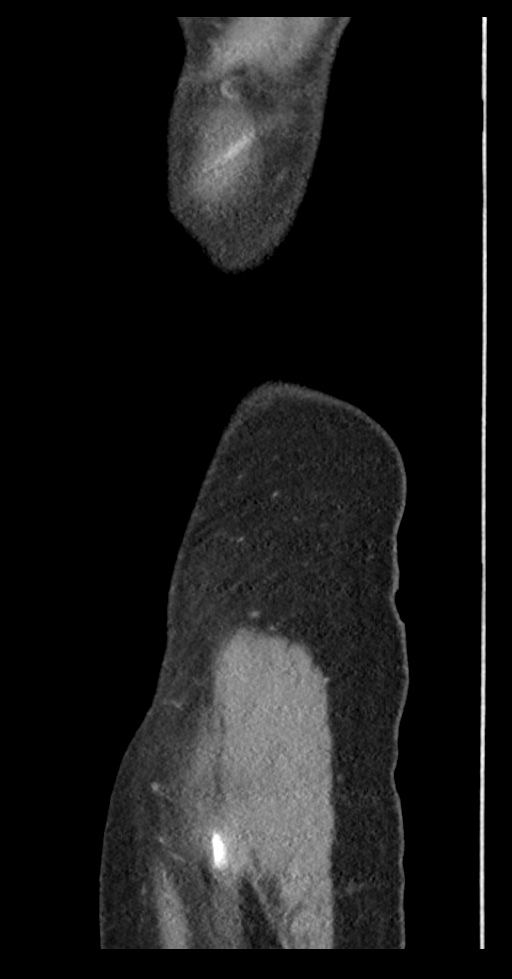
[im 13/115  bone]
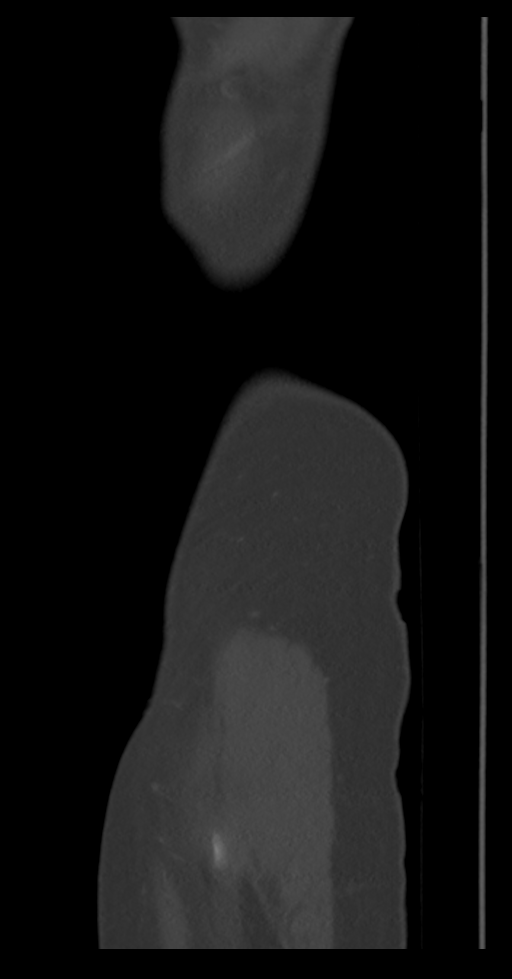
[im 31/115  soft-tissue]
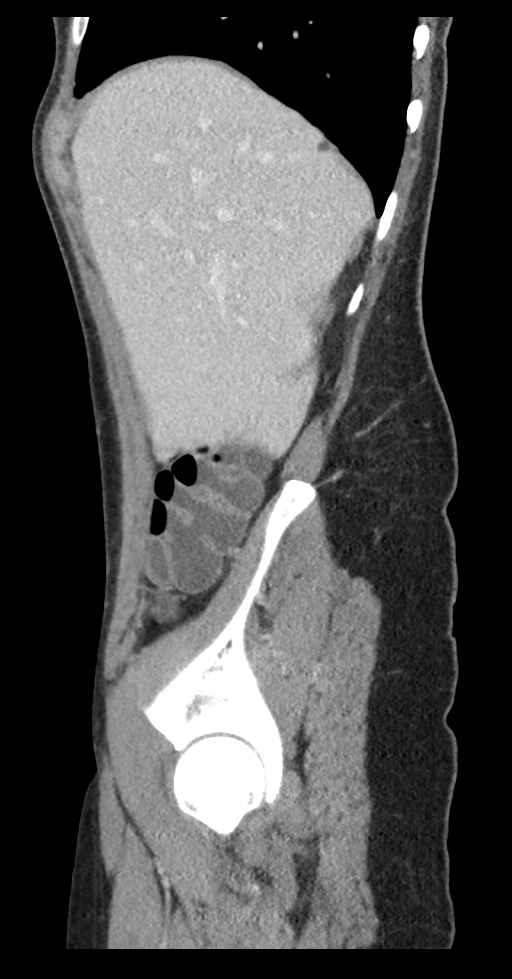
[im 43/115  soft-tissue]
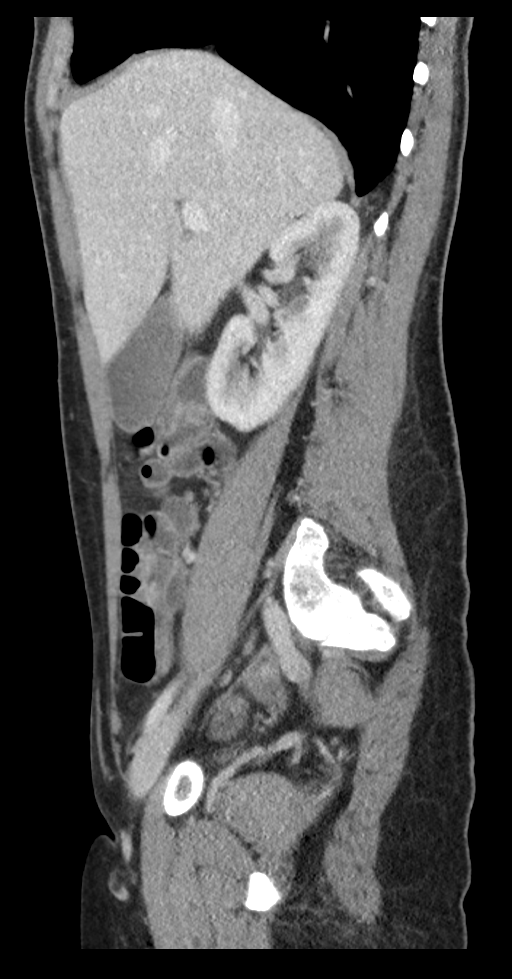
[im 61/115  soft-tissue]
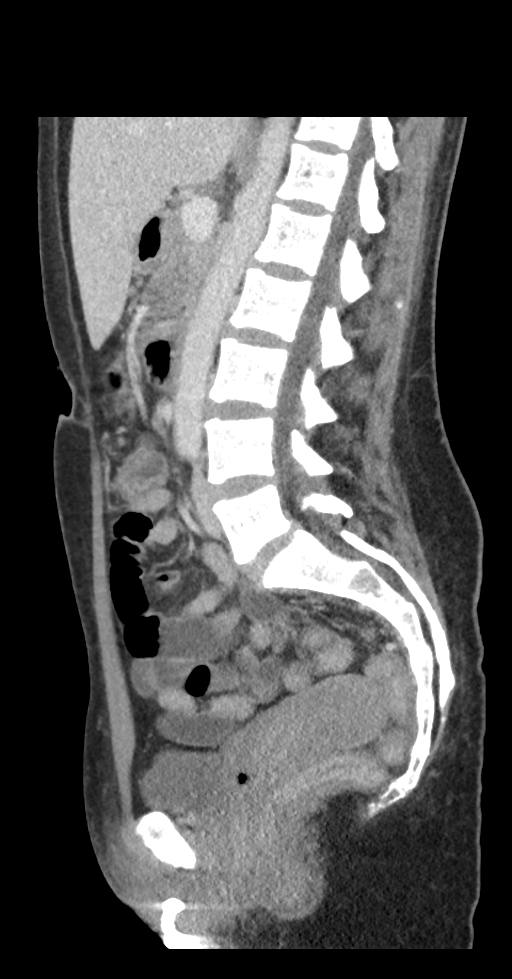
[im 73/115  soft-tissue]
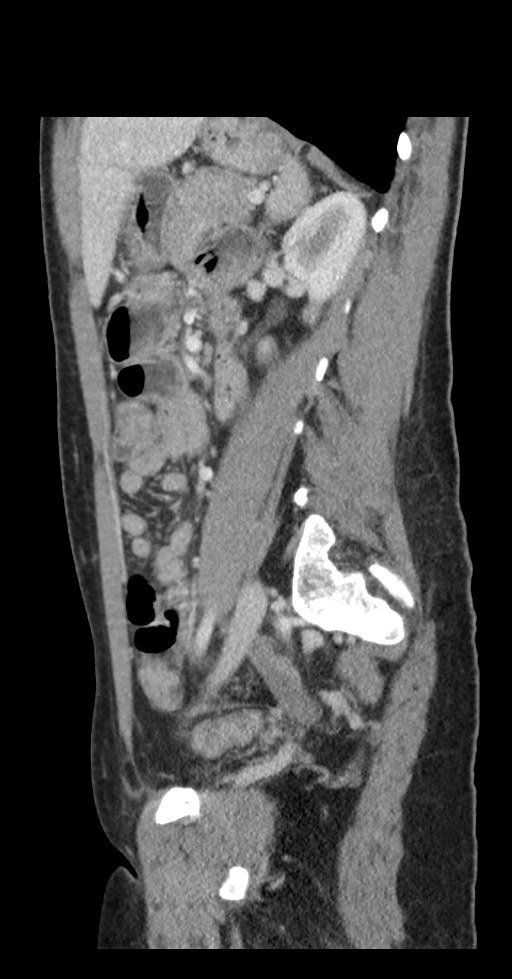
[im 85/115  soft-tissue]
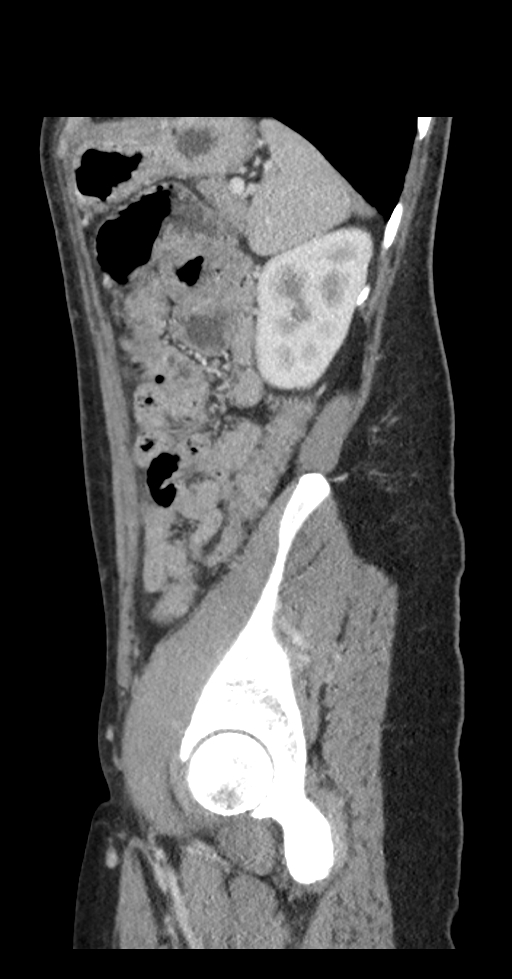
[im 103/115  soft-tissue]
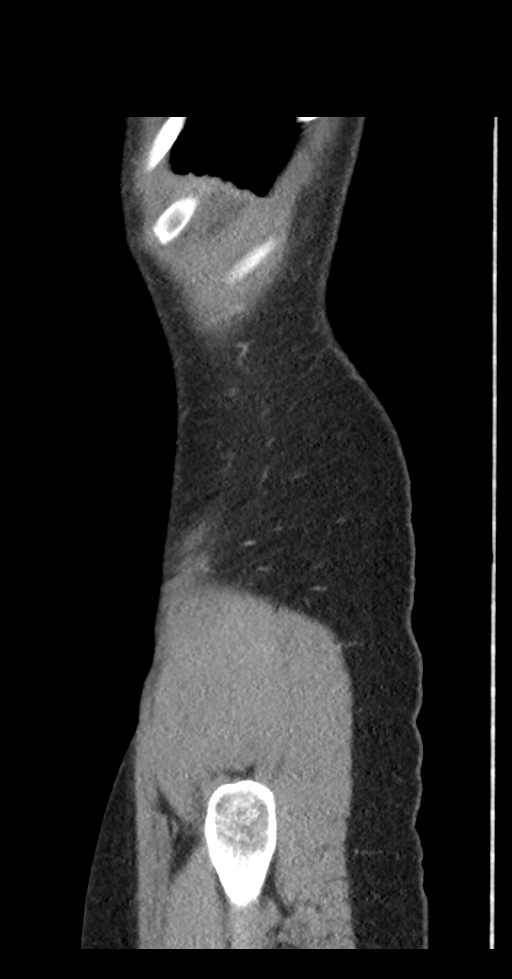

[10 of 42 positions shown; findings below may reference images not displayed]

FINDINGS: Lower chest: Unremarkable.

Hepatobiliary: Several subcentimeter low-attenuation lesions are
noted scattered throughout the hepatic parenchyma, too small to
characterize, but statistically likely to represent tiny cysts or
biliary hamartomas. No other suspicious cystic or solid hepatic
lesions. No intra or extrahepatic biliary ductal dilatation.
Gallbladder is normal in appearance.

Pancreas: No pancreatic mass. No pancreatic ductal dilatation. No
pancreatic or peripancreatic fluid collections or inflammatory
changes.

Spleen: Unremarkable.

Adrenals/Urinary Tract: Subcentimeter low-attenuation lesion in the
right kidney, too small to characterize, but statistically likely to
represent a tiny cyst. Left kidney and bilateral adrenal glands are
normal in appearance. No hydroureteronephrosis. Urinary bladder is
normal in appearance.

Stomach/Bowel: Normal appearance of the stomach. No pathologic
dilatation of small bowel or colon. Normal appendix.

Vascular/Lymphatic: Aortic atherosclerosis, without evidence of
aneurysm or dissection in the abdominal or pelvic vasculature. No
lymphadenopathy noted in the abdomen or pelvis.

Reproductive: Uterus and ovaries are unremarkable in appearance.

Other: No significant volume of ascites.  No pneumoperitoneum.

Musculoskeletal: Bilateral pars defects are noted at at L5. There
are no aggressive appearing lytic or blastic lesions noted in the
visualized portions of the skeleton.
IMPRESSION: 1. No acute findings are noted in the abdomen or pelvis to account
for the patient's symptoms.
2. Aortic atherosclerosis.

## 2021-12-11 ENCOUNTER — Ambulatory Visit (INDEPENDENT_AMBULATORY_CARE_PROVIDER_SITE_OTHER): Payer: Medicaid Other | Admitting: *Deleted

## 2021-12-11 VITALS — BP 123/86 | HR 67

## 2021-12-11 DIAGNOSIS — Z3201 Encounter for pregnancy test, result positive: Secondary | ICD-10-CM | POA: Diagnosis not present

## 2021-12-11 DIAGNOSIS — Z3200 Encounter for pregnancy test, result unknown: Secondary | ICD-10-CM

## 2021-12-11 DIAGNOSIS — N912 Amenorrhea, unspecified: Secondary | ICD-10-CM

## 2021-12-11 DIAGNOSIS — Z3480 Encounter for supervision of other normal pregnancy, unspecified trimester: Secondary | ICD-10-CM

## 2021-12-11 LAB — POCT URINE PREGNANCY: Preg Test, Ur: POSITIVE — AB

## 2021-12-11 MED ORDER — PRENATAL 28-0.8 MG PO TABS: 1.0000 | ORAL_TABLET | Freq: Every day | ORAL | 12 refills | Status: DC

## 2021-12-11 NOTE — Progress Notes (Signed)
Katherine Barber presents today for UPT. She has no unusual complaints. LMP: 11/13/21    OBJECTIVE: Appears well, in no apparent distress.  OB History     Gravida  2   Para  1   Term  1   Preterm  0   AB  1   Living  1      SAB  1   IAB  0   Ectopic  0   Multiple  0   Live Births  1          Home UPT Result: positive In-Office UPT result: positive I have reviewed the patient's medical, obstetrical, social, and family histories, and medications.   ASSESSMENT: Positive pregnancy test  PLAN Prenatal care to be completed at: [name] RX sent. Denies nausea, declines phenergan. [place] for viability and dating scheduled at checkout. Ectopic/ SAB precautions/ reasons to go to [facility] reviewed.

## 2022-01-15 ENCOUNTER — Ambulatory Visit: Payer: Medicaid Other | Admitting: *Deleted

## 2022-01-15 ENCOUNTER — Ambulatory Visit (INDEPENDENT_AMBULATORY_CARE_PROVIDER_SITE_OTHER): Payer: Medicaid Other

## 2022-01-15 VITALS — BP 128/76 | HR 81 | Ht 63.0 in | Wt 134.4 lb

## 2022-01-15 DIAGNOSIS — F17200 Nicotine dependence, unspecified, uncomplicated: Secondary | ICD-10-CM | POA: Insufficient documentation

## 2022-01-15 DIAGNOSIS — O219 Vomiting of pregnancy, unspecified: Secondary | ICD-10-CM

## 2022-01-15 DIAGNOSIS — O3680X0 Pregnancy with inconclusive fetal viability, not applicable or unspecified: Secondary | ICD-10-CM | POA: Diagnosis not present

## 2022-01-15 DIAGNOSIS — Z3480 Encounter for supervision of other normal pregnancy, unspecified trimester: Secondary | ICD-10-CM | POA: Insufficient documentation

## 2022-01-15 DIAGNOSIS — G43909 Migraine, unspecified, not intractable, without status migrainosus: Secondary | ICD-10-CM | POA: Insufficient documentation

## 2022-01-15 DIAGNOSIS — M542 Cervicalgia: Secondary | ICD-10-CM

## 2022-01-15 HISTORY — DX: Cervicalgia: M54.2

## 2022-01-15 HISTORY — DX: Migraine, unspecified, not intractable, without status migrainosus: G43.909

## 2022-01-15 MED ORDER — DOXYLAMINE-PYRIDOXINE 10-10 MG PO TBEC: 2.0000 | DELAYED_RELEASE_TABLET | Freq: Every day | ORAL | 5 refills | Status: DC

## 2022-01-15 MED ORDER — PROMETHAZINE HCL 25 MG PO TABS: 25.0000 mg | ORAL_TABLET | Freq: Four times a day (QID) | ORAL | 1 refills | Status: DC | PRN

## 2022-01-15 NOTE — Progress Notes (Signed)
Patient was assessed and managed by nursing staff during this encounter. I have reviewed the chart and agree with the documentation and plan. I have also made any necessary editorial changes.  Warden Fillers, MD 01/15/2022 1:27 PM

## 2022-01-15 NOTE — Progress Notes (Signed)
New OB Intake  I connected withNAME@ on 01/15/22 at 10:15 AM EST by In Person Visit and verified that I am speaking with the correct person using two identifiers. Nurse is located at [name] and pt is located at [place].  I discussed the limitations, risks, security and privacy concerns of performing an evaluation and management service by telephone and the availability of in person appointments. I also discussed with the patient that there may be a patient responsible charge related to this service. The patient expressed understanding and agreed to proceed.  I explained I am completing New OB Intake today. We discussed EDD of 08/20/22 that is based on LMP of 11/13/21. Pt is G3/P1. I reviewed her allergies, medications, Medical/Surgical/OB history, and appropriate screenings. I informed her of [facility] services. [facility] information placed in AVS. Based on history, this is a high risk pregnancy.  There are no problems to display for this patient.   Concerns addressed today  Delivery Plans Plans to deliver at [facility]. Patient given information for [facility] website for more information about [facility]. Patient is not interested in water birth. Offered upcoming OB visit with CNM to discuss further.  MyChart/Babyscripts MyChart access verified. I explained pt will have some visits in office and some virtually. Babyscripts instructions given and order placed. Patient verifies receipt of registration text/e-mail. Account successfully created and app downloaded.  Blood Pressure Cuff/Weight Scale Pt has BP cuff at home. Explained after first prenatal appt pt will check weekly and document in Babyscripts. Patient does not have weight scale; patient may purchase if they desire to track weight weekly in Babyscripts.  Anatomy US Explained first scheduled [place] will be around 19 weeks. Anatomy US scheduled for 19 wks at [facility]. Pt notified to arrive at [facility].  Labs Discussed Avelina Laine genetic  screening with patient. Would like both Panorama and Horizon drawn at new OB visit. Routine prenatal labs needed.  COVID Vaccine Patient has not had COVID vaccine.   Social Determinants of Health Food Insecurity: Patient denies food insecurity. WIC Referral: Patient is interested in referral to [facility].  Transportation: Patient denies transportation needs. Childcare: Discussed no children allowed at ultrasound appointments. Offered childcare services; patient declines childcare services at this time.  First visit review I reviewed new OB appt with patient. I explained they will have a provider visit that includes pelvic exam and labs. Explained pt will be seen by Dr. Alysia Penna at first visit; encounter routed to appropriate provider. Explained that patient will be seen by pregnancy navigator following visit with provider.   Harrel Lemon, RN 01/15/2022  10:18 AM

## 2022-02-04 ENCOUNTER — Encounter: Payer: Self-pay | Admitting: Obstetrics

## 2022-02-06 ENCOUNTER — Other Ambulatory Visit (HOSPITAL_COMMUNITY)
Admission: RE | Admit: 2022-02-06 | Discharge: 2022-02-06 | Disposition: A | Discharge status: Home / Self Care | Payer: Medicaid Other | Source: Ambulatory Visit | Attending: Obstetrics and Gynecology | Admitting: Obstetrics and Gynecology

## 2022-02-06 ENCOUNTER — Encounter: Payer: Self-pay | Admitting: Obstetrics

## 2022-02-06 ENCOUNTER — Ambulatory Visit (INDEPENDENT_AMBULATORY_CARE_PROVIDER_SITE_OTHER): Payer: Medicaid Other | Admitting: Obstetrics

## 2022-02-06 VITALS — BP 108/70 | HR 87 | Wt 135.0 lb

## 2022-02-06 DIAGNOSIS — Z3A12 12 weeks gestation of pregnancy: Secondary | ICD-10-CM

## 2022-02-06 DIAGNOSIS — O0991 Supervision of high risk pregnancy, unspecified, first trimester: Secondary | ICD-10-CM

## 2022-02-06 DIAGNOSIS — Z23 Encounter for immunization: Secondary | ICD-10-CM | POA: Diagnosis not present

## 2022-02-06 DIAGNOSIS — F17200 Nicotine dependence, unspecified, uncomplicated: Secondary | ICD-10-CM | POA: Diagnosis not present

## 2022-02-06 DIAGNOSIS — Z3480 Encounter for supervision of other normal pregnancy, unspecified trimester: Secondary | ICD-10-CM | POA: Insufficient documentation

## 2022-02-06 DIAGNOSIS — O0990 Supervision of high risk pregnancy, unspecified, unspecified trimester: Secondary | ICD-10-CM

## 2022-02-06 DIAGNOSIS — F419 Anxiety disorder, unspecified: Secondary | ICD-10-CM | POA: Diagnosis not present

## 2022-02-06 DIAGNOSIS — O09291 Supervision of pregnancy with other poor reproductive or obstetric history, first trimester: Secondary | ICD-10-CM

## 2022-02-06 DIAGNOSIS — F32A Depression, unspecified: Secondary | ICD-10-CM

## 2022-02-06 DIAGNOSIS — O09299 Supervision of pregnancy with other poor reproductive or obstetric history, unspecified trimester: Secondary | ICD-10-CM

## 2022-02-06 DIAGNOSIS — Z1332 Encounter for screening for maternal depression: Secondary | ICD-10-CM | POA: Diagnosis not present

## 2022-02-06 MED ORDER — SERTRALINE HCL 25 MG PO TABS: 25.0000 mg | ORAL_TABLET | Freq: Every day | ORAL | 5 refills | Status: DC

## 2022-02-06 MED ORDER — ASPIRIN 81 MG PO CHEW: 81.0000 mg | CHEWABLE_TABLET | Freq: Every day | ORAL | 5 refills | Status: DC

## 2022-02-06 NOTE — Progress Notes (Signed)
NOB, FLU vaccine given in LD, tolerated well. 

## 2022-02-06 NOTE — Progress Notes (Signed)
Subjective:    Katherine Barber is being seen today for her first obstetrical visit.  This is a planned pregnancy. She is at [redacted]w[redacted]d gestation. Her obstetrical history is significant for pre-eclampsia. Relationship with FOB: significant other, living together. Patient does intend to breast feed. Pregnancy history fully reviewed.  The information documented in the HPI was reviewed and verified.  Menstrual History: OB History     Gravida  3   Para  1   Term  1   Preterm  0   AB  1   Living  1      SAB  1   IAB  0   Ectopic  0   Multiple  0   Live Births  1            Patient's last menstrual period was 11/13/2021 (exact date).    Past Medical History:  Diagnosis Date   Anxiety    UTI (urinary tract infection)     Past Surgical History:  Procedure Laterality Date   KNEE ARTHROSCOPY Bilateral    2 on left, 1 on right   NOSE SURGERY     PALATAL EXPANSION     SHOULDER SURGERY Right 12/01/2019   WRIST SURGERY      (Not in a hospital admission)  No Known Allergies  Social History   Tobacco Use   Smoking status: Every Day    Packs/day: 0.50    Types: Cigarettes   Smokeless tobacco: Never  Substance Use Topics   Alcohol use: Yes    Comment: socially- not since pregnancy    Family History  Problem Relation Age of Onset   Cancer Paternal Grandfather    Congestive Heart Failure Father      Review of Systems Constitutional: negative for weight loss Gastrointestinal: negative for vomiting Genitourinary:negative for genital lesions and vaginal discharge and dysuria Musculoskeletal:negative for back pain Behavioral/Psych: negative for abusive relationship, depression, illegal drug usage and tobacco use    Objective:    BP 108/70   Pulse 87   Wt 135 lb (61.2 kg)   LMP 11/13/2021 (Exact Date)   BMI 23.91 kg/m  General Appearance:    Alert, cooperative, no distress, appears stated age  Head:    Normocephalic, without obvious abnormality, atraumatic   Eyes:    PERRL, conjunctiva/corneas clear, EOM's intact, fundi    benign, both eyes  Ears:    Normal TM's and external ear canals, both ears  Nose:   Nares normal, septum midline, mucosa normal, no drainage    or sinus tenderness  Throat:   Lips, mucosa, and tongue normal; teeth and gums normal  Neck:   Supple, symmetrical, trachea midline, no adenopathy;    thyroid:  no enlargement/tenderness/nodules; no carotid   bruit or JVD  Back:     Symmetric, no curvature, ROM normal, no CVA tenderness  Lungs:     Clear to auscultation bilaterally, respirations unlabored  Chest Wall:    No tenderness or deformity   Heart:    Regular rate and rhythm, S1 and S2 normal, no murmur, rub   or gallop  Breast Exam:    No tenderness, masses, or nipple abnormality  Abdomen:     Soft, non-tender, bowel sounds active all four quadrants,    no masses, no organomegaly  Genitalia:    Normal female without lesion, discharge or tenderness  Extremities:   Extremities normal, atraumatic, no cyanosis or edema  Pulses:   2+ and symmetric all extremities  Skin:   Skin color, texture, turgor normal, no rashes or lesions  Lymph nodes:   Cervical, supraclavicular, and axillary nodes normal  Neurologic:   CNII-XII intact, normal strength, sensation and reflexes    throughout      Lab Review Urine pregnancy test Labs reviewed yes Radiologic studies reviewed no  Assessment:    Pregnancy at [redacted]w[redacted]d weeks    Plan:    1. Supervision of high risk pregnancy, antepartum Rx: - Cytology - PAP( [place]) - PANORAMA PRENATAL TEST FULL PANEL - CBC/D/Plt+RPR+Rh+ABO+RubIgG... - Culture, OB Urine - Cervicovaginal ancillary only( [place]) - HgB A1c - [place] MFM OB DETAIL +14 WK; Future - Flu Vaccine QUAD 36+ mos IM (Fluarix, Quad PF)  2. History of pre-eclampsia in prior pregnancy, currently pregnant Rx: - aspirin 81 MG chewable tablet; Chew 1 tablet (81 mg total) by mouth daily.  Dispense: 30 tablet; Refill: 5  3.  Anxiety and depression Rx: - Ambulatory referral to Integrated Behavioral Health - sertraline (ZOLOFT) 25 MG tablet; Take 1 tablet (25 mg total) by mouth daily.  Dispense: 30 tablet; Refill: 5  4. Tobacco dependence - has cut back significantly, and is continuing to try to quit completely - she has a lot of domestic and work=related stressors, and smoking helps to cope with the stress  - risks of tobacco in pregnancy discussed.  Educational material dispensed   Prenatal vitamins.  Counseling provided regarding continued use of seat belts, cessation of alcohol consumption, smoking or use of illicit drugs; infection precautions i.e., influenza/TDAP immunizations, toxoplasmosis,CMV, parvovirus, listeria and varicella; workplace safety, exercise during pregnancy; routine dental care, safe medications, sexual activity, hot tubs, saunas, pools, travel, caffeine use, fish and methlymercury, potential toxins, hair treatments, varicose veins Weight gain recommendations per IOM guidelines reviewed: underweight/BMI< 18.5--> gain 28 - 40 lbs; normal weight/BMI 18.5 - 24.9--> gain 25 - 35 lbs; overweight/BMI 25 - 29.9--> gain 15 - 25 lbs; obese/BMI >30->gain  11 - 20 lbs Problem list reviewed and updated. FIRST/CF mutation testing/NIPT/QUAD SCREEN/fragile X/Ashkenazi Jewish population testing/Spinal muscular atrophy discussed: requested. Role of ultrasound in pregnancy discussed; fetal survey: requested. Amniocentesis discussed: not indicated.  Meds ordered this encounter  Medications   aspirin 81 MG chewable tablet    Sig: Chew 1 tablet (81 mg total) by mouth daily.    Dispense:  30 tablet    Refill:  5   sertraline (ZOLOFT) 25 MG tablet    Sig: Take 1 tablet (25 mg total) by mouth daily.    Dispense:  30 tablet    Refill:  5   Orders Placed This Encounter  Procedures   Culture, OB Urine   [place] MFM OB DETAIL +14 WK    Standing Status:   Future    Standing Expiration Date:   02/05/2023    Order  Specific Question:   Reason for Exam (SYMPTOM  OR DIAGNOSIS REQUIRED)    Answer:   Anatomy    Order Specific Question:   Preferred Location    Answer:   [facility] Ultrasound   Flu Vaccine QUAD 36+ mos IM (Fluarix, Quad PF)   PANORAMA PRENATAL TEST FULL PANEL    ==========Department Information========== ID: [id number] Department:[facility] [address], [address] [place] [address] Dept: [phone] Dept Fax: [phone]     Order Specific Question:   Expected due date (MM/DD/YYYY):  Answer:   08/20/2022    Order Specific Question:   Is this a twin pregnancy? (viable, no vanished twin)    Answer:   No    Order Specific Question:   Is this a surrogate or egg donor pregnancy?    Answer:   No    Order Specific Question:   I want fetal sex included in the report:    Answer:   Yes    Order Specific Question:   Maternal Weight (lbs):    Answer:   12    Order Specific Question:   Which Microdeletion Panel should be ordered?    Answer:   22q11.2 Deletion    Order Specific Question:   What type of billing?    Answer:   Furniture conservator/restorer Question:   By placing this electronic order I confirm the testing ordered herein is medically necessary and this patient has been informed of the details of the genetic test(s) ordered, including the risks, benefits, and alternatives, and has consented to testing.    Answer:   Yes    Order Specific Question:   Select an order diagnosis: For additional options refer to [email or web address]    Answer:   Encounter for supervision of other normal pregnancy in first trimester [[id number]]   CBC/D/Plt+RPR+Rh+ABO+RubIgG...   HgB A1c   Ambulatory referral to Integrated Behavioral Health    Referral Priority:   Routine    Referral Type:   Consultation    Referral Reason:   Specialty Services Required    Number of Visits Requested:   1     Follow up in 4 weeks.  I have spent a total of 25 minutes of face-to-face time, excluding clinical staff time, reviewing notes and preparing to see patient, ordering tests and/or medications, and counseling the patient.   Brock Bad, MD 02/06/2022 10:20 AM

## 2022-02-07 LAB — CERVICOVAGINAL ANCILLARY ONLY
Bacterial Vaginitis (gardnerella): NEGATIVE
Candida Glabrata: NEGATIVE
Candida Vaginitis: NEGATIVE
Chlamydia: NEGATIVE
Comment: NEGATIVE
Comment: NEGATIVE
Comment: NEGATIVE
Comment: NEGATIVE
Comment: NEGATIVE
Comment: NORMAL
Neisseria Gonorrhea: NEGATIVE
Trichomonas: NEGATIVE

## 2022-02-07 LAB — CBC/D/PLT+RPR+RH+ABO+RUBIGG...
Antibody Screen: NEGATIVE
Basophils Absolute: 0 10*3/uL (ref 0.0–0.2)
Basos: 0 %
EOS (ABSOLUTE): 0.2 10*3/uL (ref 0.0–0.4)
Eos: 2 %
HCV Ab: NONREACTIVE
HIV Screen 4th Generation wRfx: NONREACTIVE
Hematocrit: 38.2 % (ref 34.0–46.6)
Hemoglobin: 13.1 g/dL (ref 11.1–15.9)
Hepatitis B Surface Ag: NEGATIVE
Immature Grans (Abs): 0 10*3/uL (ref 0.0–0.1)
Immature Granulocytes: 0 %
Lymphocytes Absolute: 2.1 10*3/uL (ref 0.7–3.1)
Lymphs: 26 %
MCH: 33.2 pg — ABNORMAL HIGH (ref 26.6–33.0)
MCHC: 34.3 g/dL (ref 31.5–35.7)
MCV: 97 fL (ref 79–97)
Monocytes Absolute: 0.4 10*3/uL (ref 0.1–0.9)
Monocytes: 5 %
Neutrophils Absolute: 5.3 10*3/uL (ref 1.4–7.0)
Neutrophils: 67 %
Platelets: 246 10*3/uL (ref 150–450)
RBC: 3.94 x10E6/uL (ref 3.77–5.28)
RDW: 12.4 % (ref 11.7–15.4)
RPR Ser Ql: NONREACTIVE
Rh Factor: POSITIVE
Rubella Antibodies, IGG: 3.07 index (ref >0.99)
WBC: 8 10*3/uL (ref 3.4–10.8)

## 2022-02-07 LAB — HEMOGLOBIN A1C
Est. average glucose Bld gHb Est-mCnc: 108 mg/dL
Hgb A1c MFr Bld: 5.4 % (ref 4.8–5.6)

## 2022-02-07 LAB — HCV INTERPRETATION

## 2022-02-08 LAB — CYTOLOGY - PAP
Comment: NEGATIVE
Diagnosis: NEGATIVE
High risk HPV: NEGATIVE

## 2022-02-09 LAB — CULTURE, OB URINE

## 2022-02-09 LAB — URINE CULTURE, OB REFLEX

## 2022-02-10 ENCOUNTER — Other Ambulatory Visit: Payer: Self-pay | Admitting: Obstetrics

## 2022-02-10 DIAGNOSIS — O2341 Unspecified infection of urinary tract in pregnancy, first trimester: Secondary | ICD-10-CM

## 2022-02-10 MED ORDER — CEFUROXIME AXETIL 500 MG PO TABS: 500.0000 mg | ORAL_TABLET | Freq: Two times a day (BID) | ORAL | 0 refills | Status: DC

## 2022-02-11 LAB — PANORAMA PRENATAL TEST FULL PANEL:PANORAMA TEST PLUS 5 ADDITIONAL MICRODELETIONS: FETAL FRACTION: 20.7

## 2022-02-11 NOTE — Progress Notes (Signed)
Pt aware. Called in when she saw results. Aware of RX and will pick up later today.

## 2022-03-01 ENCOUNTER — Encounter: Payer: Self-pay | Admitting: Obstetrics

## 2022-03-04 ENCOUNTER — Encounter: Payer: Self-pay | Admitting: Obstetrics and Gynecology

## 2022-03-04 DIAGNOSIS — O09299 Supervision of pregnancy with other poor reproductive or obstetric history, unspecified trimester: Secondary | ICD-10-CM | POA: Insufficient documentation

## 2022-03-04 DIAGNOSIS — O139 Gestational [pregnancy-induced] hypertension without significant proteinuria, unspecified trimester: Secondary | ICD-10-CM | POA: Insufficient documentation

## 2022-03-05 ENCOUNTER — Ambulatory Visit (INDEPENDENT_AMBULATORY_CARE_PROVIDER_SITE_OTHER): Payer: Medicaid Other | Admitting: Licensed Clinical Social Worker

## 2022-03-05 DIAGNOSIS — F32A Depression, unspecified: Secondary | ICD-10-CM

## 2022-03-05 DIAGNOSIS — F419 Anxiety disorder, unspecified: Secondary | ICD-10-CM | POA: Diagnosis not present

## 2022-03-06 ENCOUNTER — Ambulatory Visit (INDEPENDENT_AMBULATORY_CARE_PROVIDER_SITE_OTHER): Payer: Medicaid Other | Admitting: Obstetrics and Gynecology

## 2022-03-06 ENCOUNTER — Ambulatory Visit: Payer: Medicaid Other | Admitting: Licensed Clinical Social Worker

## 2022-03-06 VITALS — BP 111/72 | HR 87 | Wt 141.0 lb

## 2022-03-06 DIAGNOSIS — O09299 Supervision of pregnancy with other poor reproductive or obstetric history, unspecified trimester: Secondary | ICD-10-CM

## 2022-03-06 DIAGNOSIS — F17200 Nicotine dependence, unspecified, uncomplicated: Secondary | ICD-10-CM

## 2022-03-06 DIAGNOSIS — F419 Anxiety disorder, unspecified: Secondary | ICD-10-CM

## 2022-03-06 DIAGNOSIS — Z3480 Encounter for supervision of other normal pregnancy, unspecified trimester: Secondary | ICD-10-CM

## 2022-03-06 DIAGNOSIS — O09292 Supervision of pregnancy with other poor reproductive or obstetric history, second trimester: Secondary | ICD-10-CM

## 2022-03-06 DIAGNOSIS — F321 Major depressive disorder, single episode, moderate: Secondary | ICD-10-CM

## 2022-03-06 DIAGNOSIS — Z3A16 16 weeks gestation of pregnancy: Secondary | ICD-10-CM

## 2022-03-06 DIAGNOSIS — M5430 Sciatica, unspecified side: Secondary | ICD-10-CM

## 2022-03-06 DIAGNOSIS — Z3482 Encounter for supervision of other normal pregnancy, second trimester: Secondary | ICD-10-CM

## 2022-03-06 NOTE — Progress Notes (Signed)
   PRENATAL VISIT NOTE  Subjective:  Katherine Barber is a 37 y.o. G3P1011 at [redacted]w[redacted]d being seen today for ongoing prenatal care.  She is currently monitored for the following issues for this low-risk pregnancy and has Migraine; Anxiety; Biceps tendonitis, right; Chronic right shoulder pain; IBS (irritable bowel syndrome); Internal derangement of shoulder, right; Moderate major depression ([place]); Neck pain; Patellar tracking disorder of right knee; Tobacco use disorder; Supervision of other normal pregnancy, antepartum; and Hx of preeclampsia on their problem list.  Patient reports  worsening sciatica symptoms . Mood improving.  Contractions: Not present. Vag. Bleeding: None.  Movement: Present. Denies leaking of fluid.   The following portions of the patient's history were reviewed and updated as appropriate: allergies, current medications, past family history, past medical history, past social history, past surgical history and problem list.   Objective:   Vitals:   03/06/22 0853  BP: 111/72  Pulse: 87  Weight: 141 lb (64 kg)    Fetal Status: Fetal Heart Rate (bpm): 140   Movement: Present     General:  Alert, oriented and cooperative. Patient is in no acute distress.  Skin: Skin is warm and dry. No rash noted.   Cardiovascular: Normal heart rate noted  Respiratory: Normal respiratory effort, no problems with respiration noted  Abdomen: Soft, gravid, appropriate for gestational age.  Pain/Pressure: Absent      Assessment and Plan:  Pregnancy: G3P1011 at [redacted]w[redacted]d 1. Supervision of other normal pregnancy, antepartum Reviewed normal new OB labs - AFP, Serum, Open Spina Bifida  2. Sciatica, unspecified laterality - Ambulatory referral to Physical Therapy  3. Anxiety 4. Moderate major depression ([place]) Improving Continue zoloft BH appointment today  5. Tobacco use disorder Stable, has been trying to cut down but recent life stressors have made it more difficult  Encouraged her to  continue try to cut back & stop  6. Hx of preeclampsia Adherent with ldASA Normotensive today  Please refer to After Visit Summary for other counseling recommendations.   Return in about 4 weeks (around 04/03/2022) for return OB at 20 weeks.  Future Appointments  Date Time Provider [place]  03/08/2022  9:30 AM Lynden Ang, PT [facility] None  03/29/2022  9:30 AM [facility] NURSE [facility]  03/29/2022  9:45 AM [facility] US5 [facility]  04/03/2022  9:45 AM Lynnea Ferrier, LCSW [facility] None  04/03/2022 10:55 AM Shelly Bombard, MD [facility] None    Inez Catalina, MD

## 2022-03-07 NOTE — Therapy (Signed)
OUTPATIENT PHYSICAL THERAPY LOWER EXTREMITY EVALUATION   Patient Name: Katherine Barber MRN: [id number] DOB:05-Feb-1986, 37 y.o., female Today's Date: 03/08/2022  END OF SESSION:  PT End of Session - 03/08/22 1042     Visit Number 1    Date for PT Re-Evaluation 05/10/22    Authorization Type [place] MEDICAID HEALTHY BLUE    PT Start Time 0930    PT Stop Time 1010    PT Time Calculation (min) 40 min    Activity Tolerance Patient tolerated treatment well    Behavior During Therapy WFL for tasks assessed/performed             Past Medical History:  Diagnosis Date   Anxiety    UTI (urinary tract infection)    Past Surgical History:  Procedure Laterality Date   KNEE ARTHROSCOPY Bilateral    2 on left, 1 on right   NOSE SURGERY     PALATAL EXPANSION     SHOULDER SURGERY Right 12/01/2019   WRIST SURGERY     Patient Active Problem List   Diagnosis Date Noted   Hx of preeclampsia 03/04/2022   Migraine 01/15/2022   Neck pain 01/15/2022   Tobacco use disorder 01/15/2022   Supervision of other normal pregnancy, antepartum 01/15/2022   Biceps tendonitis, right 11/22/2019   Internal derangement of shoulder, right 11/22/2019   IBS (irritable bowel syndrome) 09/03/2019   Anxiety 07/27/2019   Chronic right shoulder pain 07/27/2019   Moderate major depression ([place]) 07/27/2019   Patellar tracking disorder of right knee 05/08/2014    REFERRING PROVIDER: Inez Catalina, MD  REFERRING DIAG:  Sciatica, unspecified laterality [M54.30]   THERAPY DIAG:  Sciatica of right side - Plan: PT plan of care cert/re-cert  Muscle weakness (generalized) - Plan: PT plan of care cert/re-cert  Rationale for Evaluation and Treatment: Rehabilitation  ONSET DATE: 6-7 months ago.   SUBJECTIVE:   SUBJECTIVE STATEMENT: Pt states that she had an insidious onset of R sciatica that started about 6-7 months ago. She is currently [redacted] weeks pregnant. Pt is currently a Freight forwarder at [employer]  where she is required to lift, walk and sit intermittently throughout the day. Pt  PERTINENT HISTORY: Knee arthroscopy (bilat), R shoulder sx, wrist sx. PAIN:  Are you having pain? Yes: NPRS scale: 7/10 Pain location: L glute into her thigh  Pain description: Pinching, twisting, firey.  Aggravating factors: Walking, arching back.   Relieving factors: Rest.   PRECAUTIONS: None  WEIGHT BEARING RESTRICTIONS: No  FALLS:  Has patient fallen in last 6 months? No  LIVING ENVIRONMENT: Lives with: lives with their family Lives in: House/apartment Stairs: No Has following equipment at home: None  OCCUPATION: Psychologist, sport and exercise   PLOF: Independent  PATIENT GOALS: Pt would like to improve gait and pain with ambulation.   NEXT MD VISIT:   OBJECTIVE:   DIAGNOSTIC FINDINGS: None at this time.    COGNITION: Overall cognitive status:     SENSATION: WFL  POSTURE: No Significant postural limitations  PALPATION: Isolated to R piriformis   LOWER EXTREMITY ROM:  Active ROM Right eval Left eval  Hip flexion [facility]  Hip internal rotation [facility]  Hip external rotation [facility]  Knee flexion [facility] WFL  Knee extension WFL WFL   (Blank rows = not tested)  LOWER EXTREMITY MMT:  MMT Right eval Left eval  Hip flexion 4+ 4+  Knee flexion 5 5  Knee extension 5 5   (Blank rows = not  tested)  LOWER EXTREMITY SPECIAL TESTS:  Hip special tests: Luisa Hart (FABER) test: positive  and Piriformis test: positive   TODAY'S TREATMENT:                                                                                                                              DATE: Creating, reviewing, and completing below HEP    PATIENT EDUCATION:  Education details: Educated pt on anatomy and physiology of current symptoms, FOTO, diagnosis, prognosis, HEP,  and POC. Person educated: Patient Education method: Medical illustrator Education comprehension: verbalized understanding and returned  demonstration  HOME EXERCISE PROGRAM: Access Code: [email or web address] URL: [email or web address] Date: 03/08/2022 Prepared by: Royal Hawthorn  Exercises - Supine Lower Trunk Rotation  - 2 x daily - 7 x weekly - 2 sets - 10 reps - Supine Posterior Pelvic Tilt  - 2 x daily - 7 x weekly - 2 sets - 10 reps - Supine Piriformis Stretch with Foot on Ground  - 2 x daily - 7 x weekly - 2 sets - 30 hold   ASSESSMENT:  CLINICAL IMPRESSION: Patient referred to PT for L side sciatica. Patient will benefit from skilled PT to address below impairments, limitations and improve overall function.  OBJECTIVE IMPAIRMENTS: decreased activity tolerance, difficulty walking, decreased balance, decreased endurance, decreased mobility, decreased ROM, decreased strength, impaired flexibility, impaired UE/LE use, postural dysfunction, and pain.  ACTIVITY LIMITATIONS: bending, lifting, carry, locomotion, cleaning, community activity, driving, and or occupation  PERSONAL FACTORS: Knee arthroscopy (bilat), R shoulder sx, wrist sx. are also affecting patient's functional outcome.  REHAB POTENTIAL: Good  CLINICAL DECISION MAKING: Stable/uncomplicated  EVALUATION COMPLEXITY: Low    GOALS: Short term PT Goals Target date: 03/22/2022 Pt will be I and compliant with HEP. Baseline:  Goal status: New Pt will decrease pain by 25% overall Baseline: Goal status: New  Long term PT goals Target date: 05/03/2022 Pt will improve ROM to [facility] to improve functional mobility Baseline: Goal status: New Pt will improve  hip strength to at least 5-/5 MMT to improve functional strength Baseline: Goal status: New Pt will reduce pain to overall less than 2-3/10 with usual activity and work activity. Baseline: Goal status: New Pt will be able to ambulate community distances at least 1000 ft without onset of symptoms. Baseline: Goal status: New       5. Pt will be able to perform lumbar flexion without onset of  symptoms.       Baseline:         Goal status: New  PLAN: PT FREQUENCY: 1-2 times per week   PT DURATION: 6-8 weeks  PLANNED INTERVENTIONS (unless contraindicated): aquatic PT, Canalith repositioning, cryotherapy, Electrical stimulation, Iontophoresis with 4 mg/ml dexamethasome, Moist heat, traction, Ultrasound, gait training, Therapeutic exercise, balance training, neuromuscular re-education, patient/family education, prosthetic training, manual techniques, passive ROM, dry needling, taping, vasopnuematic device, vestibular, spinal manipulations, joint manipulations  PLAN FOR NEXT SESSION: Assess HEP/update PRN, Work on core  and glute strength.    Lynden Ang, PT 03/08/2022, 10:44 AM

## 2022-03-08 ENCOUNTER — Ambulatory Visit: Payer: Medicaid Other | Attending: Obstetrics and Gynecology | Admitting: Physical Therapy

## 2022-03-08 DIAGNOSIS — M6281 Muscle weakness (generalized): Secondary | ICD-10-CM | POA: Diagnosis present

## 2022-03-08 DIAGNOSIS — M5431 Sciatica, right side: Secondary | ICD-10-CM

## 2022-03-08 LAB — AFP, SERUM, OPEN SPINA BIFIDA
AFP MoM: 2.19
AFP Value: 77.9 ng/mL
Gest. Age on Collection Date: 16.1 weeks
Maternal Age At EDD: 35.3 yr
OSBR Risk 1 IN: 534
Test Results:: NEGATIVE
Weight: 141 [lb_av]

## 2022-03-08 NOTE — BH Specialist Note (Signed)
Integrated Behavioral Health via Telemedicine Visit  03/08/2022 Katherine Barber [id number]  Number of [place] Clinician visits: 1 Session Start time:  1:30pm  Session End time: 1:48pm  Total time in minutes: 18 mins via mychart   Referring Provider: Dr. Jodi Mourning Patient/Family location: home  [facility] Provider location: [place]  All persons participating in visit: Pt Katherine Barber and LCSW A Linton Rump Types of Service: Individual psychotherapy and Video visit  I connected with  and/or Katherine Barber's n/a via  Telephone or Video Enabled Telemedicine Application  (Video is Caregility application) and verified that I am speaking with the correct person using two identifiers. Discussed confidentiality: Yes   I discussed the limitations of telemedicine and the availability of in person appointments.  Discussed there is a possibility of technology failure and discussed alternative modes of communication if that failure occurs.  I discussed that engaging in this telemedicine visit, they consent to the provision of behavioral healthcare and the services will be billed under their insurance.  Patient and/or legal guardian expressed understanding and consented to Telemedicine visit: Yes   Presenting Concerns: Patient and/or family reports the following symptoms/concerns: anxiety Duration of problem: apprx 6 months; Severity of problem: mild  Patient and/or Family's Strengths/Protective Factors: Concrete supports in place (healthy food, safe environments, etc.)  Goals Addressed: Patient will:  Reduce symptoms of: anxiety   Increase knowledge and/or ability of: coping skills   Demonstrate ability to: Increase healthy adjustment to current life circumstances  Progress towards Goals: Ongoing  Interventions: Interventions utilized:  Supportive Counseling Standardized Assessments completed: Not Needed  Patient and/or Family Response: Katherine Barber responded well to mychart visit. Ms.  Barber reports concerns regarding current relationship with boyfriend causes her added stress and anxiety.   Assessment: Patient currently experiencing anxiety.   Patient may benefit from integrated behavioral health.  Plan: Follow up with behavioral health clinician on : 04/03/2021 Behavioral recommendations: Prioritize rest, engage in self care, communication needs with partner for added support  Referral(s): [place] (In Clinic)  I discussed the assessment and treatment plan with the patient and/or parent/guardian. They were provided an opportunity to ask questions and all were answered. They agreed with the plan and demonstrated an understanding of the instructions.   They were advised to call back or seek an in-person evaluation if the symptoms worsen or if the condition fails to improve as anticipated.  Lynnea Ferrier, LCSW

## 2022-03-14 ENCOUNTER — Ambulatory Visit: Payer: Medicaid Other | Admitting: Physical Therapy

## 2022-03-14 DIAGNOSIS — M6281 Muscle weakness (generalized): Secondary | ICD-10-CM

## 2022-03-14 DIAGNOSIS — M5431 Sciatica, right side: Secondary | ICD-10-CM

## 2022-03-14 NOTE — Patient Instructions (Signed)

## 2022-03-14 NOTE — Therapy (Signed)
OUTPATIENT PHYSICAL THERAPY LOWER EXTREMITY PROGRESS NOTE   Patient Name: Katherine Barber MRN: [id number] DOB:April 01, 1985, 37 y.o., female Today's Date: 03/14/2022  END OF SESSION:  PT End of Session - 03/14/22 0809     Visit Number 2    Date for PT Re-Evaluation 05/10/22    Authorization Type [place] MEDICAID HEALTHY BLUE    PT Start Time 0808    PT Stop Time 0846    PT Time Calculation (min) 38 min    Activity Tolerance Patient tolerated treatment well             Past Medical History:  Diagnosis Date   Anxiety    UTI (urinary tract infection)    Past Surgical History:  Procedure Laterality Date   KNEE ARTHROSCOPY Bilateral    2 on left, 1 on right   NOSE SURGERY     PALATAL EXPANSION     SHOULDER SURGERY Right 12/01/2019   WRIST SURGERY     Patient Active Problem List   Diagnosis Date Noted   Hx of preeclampsia 03/04/2022   Migraine 01/15/2022   Neck pain 01/15/2022   Tobacco use disorder 01/15/2022   Supervision of other normal pregnancy, antepartum 01/15/2022   Biceps tendonitis, right 11/22/2019   Internal derangement of shoulder, right 11/22/2019   IBS (irritable bowel syndrome) 09/03/2019   Anxiety 07/27/2019   Chronic right shoulder pain 07/27/2019   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 She is currently [redacted] weeks  pregnant. Pt is currently a Production designer, theatre/television/film at [employer] where she is required to lift, walk and sit intermittently throughout the day.  Knee arthroscopy (bilat), R shoulder sx, wrist sx. Reports history of hypermobility PAIN:  Are you having pain? Yes: NPRS scale: 7/10 Pain location:  Right glute into her thigh  Pain description: Pinching, twisting, firey.  Aggravating factors: Walking, arching back.   Relieving factors: Rest.   PRECAUTIONS: None  WEIGHT BEARING RESTRICTIONS: No  FALLS:  Has patient fallen in last 6 months? No  LIVING ENVIRONMENT: Lives with: lives with their family Lives in: House/apartment Stairs: No Has following equipment at home: None  OCCUPATION: Engineer, site   PLOF: Independent  PATIENT GOALS: Pt would like to improve gait and pain with ambulation.   NEXT MD VISIT:   OBJECTIVE:   DIAGNOSTIC FINDINGS: None at this time.    COGNITION: Overall cognitive status:     SENSATION: WFL  POSTURE: No Significant postural limitations  PALPATION: Isolated to R piriformis   LOWER EXTREMITY ROM:  Active ROM Right eval Left eval  Hip flexion [facility]  Hip internal rotation [facility]  Hip external rotation [facility]  Knee flexion [facility] WFL  Knee extension WFL WFL   (Blank rows = not tested)  LOWER EXTREMITY MMT:  MMT Right eval Left eval  Hip flexion 4+ 4+  Knee flexion 5 5  Knee extension 5 5   (  Blank rows = not tested)  LOWER EXTREMITY SPECIAL TESTS:  Hip special tests: Saralyn Pilar (FABER) test: positive  and Piriformis test: positive   TODAY'S TREATMENT:       1/11: SLS keeping bent knee avoiding knee hyperextension Review of supine piriformis stretch, add bent knee then progression to towel assist Seated piriformis stretch gentle to avoid overstretch Manual therapy: soft tissue mobilization right gluteals and piriformis in sidelying Trigger Point Dry-Needling  Treatment instructions: Expect mild to moderate muscle soreness. S/S of  pneumothorax if dry needled over a lung field, and to seek immediate medical attention should they occur. Patient verbalized understanding of these instructions and education.  Patient Consent Given: Yes Education handout provided: Yes Muscles treated: right gluteals, piriformis in sidelying Electrical stimulation performed: No Parameters: N/A Treatment response/outcome: decreased tender points, improved soft tissue mobility                                                                                                                             DATE: Creating, reviewing, and completing below HEP    PATIENT EDUCATION:  Education details: DN aftercare;  Educated pt on anatomy and physiology of current symptoms, FOTO, diagnosis, prognosis, HEP,  and POC. Person educated: Patient Education method: Customer service manager Education comprehension: verbalized understanding and returned demonstration  HOME EXERCISE PROGRAM: Access Code: [email or web address] URL: [email or web address] Date: 03/08/2022 Prepared by: Rudi Heap  Exercises - Supine Lower Trunk Rotation  - 2 x daily - 7 x weekly - 2 sets - 10 reps - Supine Posterior Pelvic Tilt  - 2 x daily - 7 x weekly - 2 sets - 10 reps - Supine Piriformis Stretch with Foot on Ground  - 2 x daily - 7 x weekly - 2 sets - 30 hold   ASSESSMENT:  CLINICAL IMPRESSION:  Good initial response to DN and manual therapy with reports of decreased pain and improved mobility noted with performance of stretching exercises.  The patient was encouraged in regular performance of HEP post DN including soft tissue lengthening and strengthening exercises to enhance long term benefit. She would benefit from progression to core and glute strengthening next visit.     OBJECTIVE IMPAIRMENTS: decreased activity tolerance, difficulty walking, decreased balance, decreased endurance, decreased mobility, decreased ROM, decreased strength, impaired  flexibility, impaired UE/LE use, postural dysfunction, and pain.  ACTIVITY LIMITATIONS: bending, lifting, carry, locomotion, cleaning, community activity, driving, and or occupation  PERSONAL FACTORS: Knee arthroscopy (bilat), R shoulder sx, wrist sx. are also affecting patient's functional outcome.  REHAB POTENTIAL: Good  CLINICAL DECISION MAKING: Stable/uncomplicated  EVALUATION COMPLEXITY: Low    GOALS: Short term PT Goals Target date: 03/28/2022 Pt will be I and compliant with HEP. Baseline:  Goal status: New Pt will decrease pain by 25% overall Baseline: Goal status: New  Long term PT goals Target date: 05/10/2022 Pt will improve ROM to [facility] to improve functional mobility Baseline: Goal status: New Pt will improve  hip strength to  at least 5-/5 MMT to improve functional strength Baseline: Goal status: New Pt will reduce pain to overall less than 2-3/10 with usual activity and work activity. Baseline: Goal status: New Pt will be able to ambulate community distances at least 1000 ft without onset of symptoms. Baseline: Goal status: New       5. Pt will be able to perform lumbar flexion without onset of symptoms.       Baseline:         Goal status: New  PLAN: PT FREQUENCY: 1-2 times per week   PT DURATION: 6-8 weeks  PLANNED INTERVENTIONS (unless contraindicated): aquatic PT, Canalith repositioning, cryotherapy, Electrical stimulation, Iontophoresis with 4 mg/ml dexamethasome, Moist heat, traction, Ultrasound, gait training, Therapeutic exercise, balance training, neuromuscular re-education, patient/family education, prosthetic training, manual techniques, passive ROM, dry needling, taping, vasopnuematic device, vestibular, spinal manipulations, joint manipulations  PLAN FOR NEXT SESSION: Assess response to DN;  HEP/update PRN, Work on core and glute strength particularly given hypermobility history  Ruben Im, PT 03/14/22 8:49 AM Phone: [phone] Fax:  [phone]

## 2022-03-21 ENCOUNTER — Ambulatory Visit: Payer: Medicaid Other | Admitting: Physical Therapy

## 2022-03-21 DIAGNOSIS — M6281 Muscle weakness (generalized): Secondary | ICD-10-CM

## 2022-03-21 DIAGNOSIS — M5431 Sciatica, right side: Secondary | ICD-10-CM

## 2022-03-21 NOTE — Therapy (Signed)
OUTPATIENT PHYSICAL THERAPY LOWER EXTREMITY PROGRESS NOTE   Patient Name: Katherine Barber MRN: [id number] DOB:1985/09/15, 37 y.o., female Today's Date: 03/21/2022  END OF SESSION:  PT End of Session - 03/21/22 0757     Visit Number 3    Date for PT Re-Evaluation 05/10/22    Authorization Type [place] MEDICAID HEALTHY BLUE    PT Start Time 0800    PT Stop Time 0840    PT Time Calculation (min) 40 min    Activity Tolerance Patient tolerated treatment well             Past Medical History:  Diagnosis Date   Anxiety    UTI (urinary tract infection)    Past Surgical History:  Procedure Laterality Date   KNEE ARTHROSCOPY Bilateral    2 on left, 1 on right   NOSE SURGERY     PALATAL EXPANSION     SHOULDER SURGERY Right 12/01/2019   WRIST SURGERY     Patient Active Problem List   Diagnosis Date Noted   Hx of preeclampsia 03/04/2022   Migraine 01/15/2022   Neck pain 01/15/2022   Tobacco use disorder 01/15/2022   Supervision of other normal pregnancy, antepartum 01/15/2022   Biceps tendonitis, right 11/22/2019   Internal derangement of shoulder, right 11/22/2019   IBS (irritable bowel syndrome) 09/03/2019   Anxiety 07/27/2019   Chronic right shoulder pain 07/27/2019   Moderate major depression ([place]) 07/27/2019   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   SUBJECTIVE:   SUBJECTIVE STATEMENT: Lot's of work stress.  The DN helped the day of.  The day after dry needling the pain was there, in the lateral hip.  Overall pain is less frequent. I did notice some left SI pain and round ligament.     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 She is currently [redacted] weeks pregnant. 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  Knee arthroscopy (bilat), R shoulder sx, wrist sx. Reports history of hypermobility PAIN:  Are you having pain? Yes: NPRS scale: 3/10 Pain location:  Right glute into her thigh  Pain description: Pinching, twisting, firey.  Aggravating factors: Walking, arching back.   Relieving factors: Rest.   PRECAUTIONS: None  WEIGHT BEARING RESTRICTIONS: No  FALLS:  Has patient fallen in last 6 months? No  LIVING ENVIRONMENT: Lives with: lives with their family Lives in: House/apartment Stairs: No Has following equipment at home: None  OCCUPATION: Psychologist, sport and exercise   PLOF: Independent  PATIENT GOALS: Pt would like to improve gait and pain with ambulation.   NEXT MD VISIT:   OBJECTIVE:   DIAGNOSTIC FINDINGS: None at this time.    COGNITION: Overall cognitive status:     SENSATION: WFL  POSTURE: No Significant postural limitations  PALPATION: Isolated to R piriformis   LOWER EXTREMITY ROM:  Active ROM Right eval Left eval  Hip flexion [facility]  Hip internal rotation [facility]  Hip external rotation [facility]  Knee flexion [facility] WFL  Knee extension WFL WFL   (Blank rows =  not tested)  LOWER EXTREMITY MMT:  MMT Right eval Left eval  Hip flexion 4+ 4+  Knee flexion 5 5  Knee extension 5 5   (Blank rows = not tested)  LOWER EXTREMITY SPECIAL TESTS:  Hip special tests: Luisa Hart (FABER) test: positive  and Piriformis test: positive   TODAY'S TREATMENT:     1/18: Review of SLS keeping bent knee avoiding knee hyperextension and avoiding pelvic drop (used mirror for visual feedback) Standing abdominal draw in  Quadruped fire hydrants 10x right/left (more difficult with WB on right) Quadruped bent knee hip extension without arching back 10x  right/left Standing WB on right with 4 ways left 10x (cues to avoid knee hyperextension) Manual therapy: soft tissue mobilization right gluteals and piriformis in sidelying Trigger Point Dry-Needling  Treatment instructions: Expect mild to moderate muscle soreness. S/S of pneumothorax if dry needled over a lung field, and to seek immediate medical attention should they occur. Patient verbalized understanding of these instructions and education.  Patient Consent Given: Yes Education handout provided: Yes Muscles treated: right gluteals, piriformis in sidelying Electrical stimulation performed: No Parameters: N/A Treatment response/outcome: decreased tender points, improved soft tissue mobility       1/11: SLS keeping bent knee avoiding knee hyperextension Review of supine piriformis stretch, add bent knee then progression to towel assist Seated piriformis stretch gentle to avoid overstretch Manual therapy: soft tissue mobilization right gluteals and piriformis in sidelying Trigger Point Dry-Needling  Treatment instructions: Expect mild to moderate muscle soreness. S/S of pneumothorax if dry needled over a lung field, and to seek immediate medical attention should they occur. Patient verbalized understanding of these instructions and education.  Patient Consent Given: Yes Education handout provided: Yes Muscles treated: right gluteals, piriformis in sidelying Electrical stimulation performed: No Parameters: N/A Treatment response/outcome: decreased tender points, improved soft tissue mobility                                                                                                                             DATE: Creating, reviewing, and completing below HEP    PATIENT EDUCATION:  Education details: DN aftercare;  Educated pt on anatomy and physiology of current symptoms, FOTO, diagnosis, prognosis, HEP,  and POC. Person educated: Patient Education method: Software engineer Education comprehension: verbalized understanding and returned demonstration  HOME EXERCISE PROGRAM: Access Code: [email or web address] URL: [email or web address] Date: 03/21/2022 Prepared by: Lavinia Sharps  Exercises - Supine Lower Trunk Rotation  - 2 x daily - 7 x weekly - 2 sets - 10 reps - Supine Posterior Pelvic Tilt  - 2 x daily - 7 x weekly - 2 sets - 10 reps - Supine Piriformis Stretch with Foot on Ground  - 2 x daily - 7 x weekly - 2 sets - 30 hold - Quadruped Fire Hydrant  - 1 x daily - 7 x weekly - 1 sets - 8 reps - Quadruped Bent Leg Hip Extension  - 1 x daily -  7 x weekly - 1 sets - 8 reps - Standing Hip Flexion AROM  - 1 x daily - 7 x weekly - 1 sets - 5-8 reps   ASSESSMENT:  CLINICAL IMPRESSION:  Symmetrical hip external rotation without pain post session. Verbal cues for proper recruitment of glute medius  and quad muscle activation needed for standing and walking longer periods of time without a pelvic drop and knee hyperextension.   Fewer tender points noted compared to last visit.  Updated HEP to include pelvic stability and strengthening ex's.    OBJECTIVE IMPAIRMENTS: decreased activity tolerance, difficulty walking, decreased balance, decreased endurance, decreased mobility, decreased ROM, decreased strength, impaired flexibility, impaired UE/LE use, postural dysfunction, and pain.  ACTIVITY LIMITATIONS: bending, lifting, carry, locomotion, cleaning, community activity, driving, and or occupation  PERSONAL FACTORS: Knee arthroscopy (bilat), R shoulder sx, wrist sx. are also affecting patient's functional outcome.  REHAB POTENTIAL: Good  CLINICAL DECISION MAKING: Stable/uncomplicated  EVALUATION COMPLEXITY: Low    GOALS: Short term PT Goals Target date: 03/28/2022 Pt will be I and compliant with HEP. Baseline:  Goal status: New Pt will decrease pain by 25% overall Baseline: Goal status: New  Long term PT goals Target date:  05/10/2022 Pt will improve ROM to [facility] to improve functional mobility Baseline: Goal status: New Pt will improve  hip strength to at least 5-/5 MMT to improve functional strength Baseline: Goal status: New Pt will reduce pain to overall less than 2-3/10 with usual activity and work activity. Baseline: Goal status: New Pt will be able to ambulate community distances at least 1000 ft without onset of symptoms. Baseline: Goal status: New       5. Pt will be able to perform lumbar flexion without onset of symptoms.       Baseline:         Goal status: New  PLAN: PT FREQUENCY: 1-2 times per week   PT DURATION: 6-8 weeks  PLANNED INTERVENTIONS (unless contraindicated): aquatic PT, Canalith repositioning, cryotherapy, Electrical stimulation, Iontophoresis with 4 mg/ml dexamethasome, Moist heat, traction, Ultrasound, gait training, Therapeutic exercise, balance training, neuromuscular re-education, patient/family education, prosthetic training, manual techniques, passive ROM, dry needling, taping, vasopnuematic device, vestibular, spinal manipulations, joint manipulations  PLAN FOR NEXT SESSION: Assess response to DN;  HEP/update PRN, Work on core and glute strength particularly given hypermobility history  Ruben Im, PT 03/21/22 10:38 AM Phone: [phone] Fax: [phone]

## 2022-03-26 ENCOUNTER — Other Ambulatory Visit: Payer: Medicaid Other

## 2022-03-26 ENCOUNTER — Ambulatory Visit: Payer: Medicaid Other

## 2022-03-28 ENCOUNTER — Ambulatory Visit: Payer: Medicaid Other | Admitting: Physical Therapy

## 2022-03-28 DIAGNOSIS — M5431 Sciatica, right side: Secondary | ICD-10-CM | POA: Diagnosis not present

## 2022-03-28 DIAGNOSIS — M6281 Muscle weakness (generalized): Secondary | ICD-10-CM

## 2022-03-28 NOTE — Therapy (Addendum)
OUTPATIENT PHYSICAL THERAPY LOWER EXTREMITY PROGRESS NOTE/DISCHARGE SUMMARY   Patient Name: Katherine Barber MRN: [id number] DOB:07/28/1985, 37 y.o., female Today's Date: 03/21/2022  END OF SESSION:  PT End of Session - 03/21/22 0757     Visit Number 3    Date for PT Re-Evaluation 05/10/22    Authorization Type [place] MEDICAID HEALTHY BLUE    PT Start Time 0800    PT Stop Time 0840    PT Time Calculation (min) 40 min    Activity Tolerance Patient tolerated treatment well             Past Medical History:  Diagnosis Date   Anxiety    UTI (urinary tract infection)    Past Surgical History:  Procedure Laterality Date   KNEE ARTHROSCOPY Bilateral    2 on left, 1 on right   NOSE SURGERY     PALATAL EXPANSION     SHOULDER SURGERY Right 12/01/2019   WRIST SURGERY     Patient Active Problem List   Diagnosis Date Noted   Hx of preeclampsia 03/04/2022   Migraine 01/15/2022   Neck pain 01/15/2022   Tobacco use disorder 01/15/2022   Supervision of other normal pregnancy, antepartum 01/15/2022   Biceps tendonitis, right 11/22/2019   Internal derangement of shoulder, right 11/22/2019   IBS (irritable bowel syndrome) 09/03/2019   Anxiety 07/27/2019   Chronic right shoulder pain 07/27/2019   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   SUBJECTIVE:   SUBJECTIVE STATEMENT: It hasn't hurt all that much this week. Low intensity pain when it is there.     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 She is currently [redacted] weeks pregnant. 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  Knee arthroscopy (bilat), R shoulder sx, wrist sx. Reports history of hypermobility PAIN:  Are you having pain? Yes: NPRS scale: 1/10 Pain location:  Right glute into her thigh  Pain description: Pinching, twisting, firey.  Aggravating factors: Walking, arching back.   Relieving factors: Rest.   PRECAUTIONS: None  WEIGHT BEARING RESTRICTIONS: No  FALLS:  Has patient fallen in last 6 months? No  LIVING ENVIRONMENT: Lives with: lives with their family Lives in: House/apartment Stairs: No Has following equipment at home: None  OCCUPATION: Psychologist, sport and exercise   PLOF: Independent  PATIENT GOALS: Pt would like to improve gait and pain with ambulation.   NEXT MD VISIT:   OBJECTIVE:   DIAGNOSTIC FINDINGS: None at this time.    COGNITION: Overall cognitive status:     SENSATION: WFL  POSTURE: No Significant postural limitations  PALPATION: Isolated to R piriformis   LOWER EXTREMITY ROM:  Active ROM Right eval Left eval  Hip flexion [facility]  Hip internal rotation [facility]  Hip external rotation [facility]  Knee flexion [facility] WFL  Knee extension WFL WFL   (Blank rows = not tested)  LOWER EXTREMITY MMT:  MMT Right eval Left eval  Hip flexion 4+ 4+  Knee flexion 5 5  Knee  extension 5 5   (Blank rows = not tested)  LOWER EXTREMITY SPECIAL TESTS:  Hip special tests: Luisa Hart (FABER) test: positive  and Piriformis test: positive   TODAY'S TREATMENT:     1/25: Sidelying clam band green band right/left 10x Side plank (knees and elbows) isometric hold 3x, then added clam 10x right/left Standing green band above knees lateral taps 10x right/left Standing hip abduction isometric with ball against wall 5x Standing hip abduction isometric with ball against wall/opp leg clam 10x right/left Review of SLS keeping bent knee avoiding knee hyperextension            1/18: Review of SLS keeping bent knee avoiding knee hyperextension and avoiding pelvic drop (used mirror for visual feedback) Standing abdominal draw in  Quadruped fire hydrants 10x right/left (more difficult with WB on right) Quadruped bent knee hip extension without arching back 10x right/left Standing WB on right with 4 ways left 10x (cues to avoid knee hyperextension) Manual therapy: soft tissue mobilization right gluteals and piriformis in sidelying Trigger Point Dry-Needling  Treatment instructions: Expect mild to moderate muscle soreness. S/S of pneumothorax if dry needled over a lung field, and to seek immediate medical attention should they occur. Patient verbalized understanding of these instructions and education.  Patient Consent Given: Yes Education handout provided: Yes Muscles treated: right gluteals, piriformis in sidelying Electrical stimulation performed: No Parameters: N/A Treatment response/outcome: decreased tender points, improved soft tissue mobility       1/11: SLS keeping bent knee avoiding knee hyperextension Review of supine piriformis stretch, add bent knee then progression to towel assist Seated piriformis stretch gentle to avoid overstretch Manual therapy: soft tissue mobilization right gluteals and piriformis in sidelying Trigger Point Dry-Needling  Treatment instructions: Expect mild to moderate muscle soreness. S/S of pneumothorax if dry needled over a lung field, and to seek immediate medical attention should they occur. Patient verbalized understanding of these instructions and education.  Patient Consent Given: Yes Education handout provided: Yes Muscles treated: right gluteals, piriformis in sidelying Electrical stimulation performed: No Parameters: N/A Treatment response/outcome: decreased tender points, improved soft tissue mobility     PATIENT EDUCATION:  Education details: DN aftercare;  Educated pt on anatomy and  physiology of current symptoms, FOTO, diagnosis, prognosis, HEP,  and POC. Person educated: Patient Education method: Medical illustrator Education comprehension: verbalized understanding and returned demonstration  HOME EXERCISE PROGRAM: Access Code: [email or web address] URL: [email or web address] Date: 03/28/2022 Prepared by: Lavinia Sharps  Exercises - Supine Lower Trunk Rotation  - 2 x daily - 7 x weekly - 2 sets - 10 reps - Supine Posterior Pelvic Tilt  - 2 x daily - 7 x weekly - 2 sets - 10 reps - Supine Piriformis Stretch with Foot on Ground  - 2 x daily - 7 x weekly - 2 sets - 30 hold - Quadruped Fire Hydrant  - 1 x daily - 7 x weekly - 1 sets - 8 reps - Quadruped Bent Leg Hip Extension  - 1 x daily - 7 x weekly - 1 sets - 8 reps - Standing Hip Flexion AROM  - 1 x daily - 7 x weekly - 1 sets - 5-8 reps - Clam with Resistance  - 1 x daily - 7 x weekly - 1 sets - 10 reps - Side Plank with Clam  - 1 x daily - 7 x weekly - 1 sets - 10 reps - Standing Clam with Resistance Loop  - 1 x daily - 7 x weekly -  1 sets - 10 reps - Sidelying Hip Abduction at Wall  - 1 x daily - 7 x weekly - 1 sets - 10 reps   ASSESSMENT:  CLINICAL IMPRESSION:  Reports minimal pain this past week.  Able to progress pelvic/hip stability ex's specifically targeting gluteus medius muscle activation.  Denies pain today in her hip.  Verbal cues to avoid knee hyperextension in standing.       OBJECTIVE IMPAIRMENTS: decreased activity tolerance, difficulty walking, decreased balance, decreased endurance, decreased mobility, decreased ROM, decreased strength, impaired flexibility, impaired UE/LE use, postural dysfunction, and pain.  ACTIVITY LIMITATIONS: bending, lifting, carry, locomotion, cleaning, community activity, driving, and or occupation  PERSONAL FACTORS: Knee arthroscopy (bilat), R shoulder sx, wrist sx. are also affecting patient's functional outcome.  REHAB POTENTIAL: Good  CLINICAL  DECISION MAKING: Stable/uncomplicated  EVALUATION COMPLEXITY: Low    GOALS: Short term PT Goals Target date: 03/28/2022 Pt will be I and compliant with HEP. Baseline:  Goal status: met 1/25 Pt will decrease pain by 25% overall Baseline: Goal status: met 1/25  Long term PT goals Target date: 05/10/2022 Pt will improve ROM to [facility] to improve functional mobility Baseline: Goal status: New Pt will improve  hip strength to at least 5-/5 MMT to improve functional strength Baseline: Goal status: New Pt will reduce pain to overall less than 2-3/10 with usual activity and work activity. Baseline: Goal status: New Pt will be able to ambulate community distances at least 1000 ft without onset of symptoms. Baseline: Goal status: New       5. Pt will be able to perform lumbar flexion without onset of symptoms.       Baseline:         Goal status: New  PLAN: PT FREQUENCY: 1-2 times per week   PT DURATION: 6-8 weeks  PLANNED INTERVENTIONS (unless contraindicated): aquatic PT, Canalith repositioning, cryotherapy, Electrical stimulation, Iontophoresis with 4 mg/ml dexamethasome, Moist heat, traction, Ultrasound, gait training, Therapeutic exercise, balance training, neuromuscular re-education, patient/family education, prosthetic training, manual techniques, passive ROM, dry needling, taping, vasopnuematic device, vestibular, spinal manipulations, joint manipulations  PLAN FOR NEXT SESSION:  DN as needed;  HEP/update PRN, Work on core and glute strength particularly given hypermobility history  Ruben Im, PT 03/28/22 8:46 AM Phone: [phone] Fax: [phone]   PHYSICAL THERAPY DISCHARGE SUMMARY  Visits from Start of Care: 3  Current functional level related to goals / functional outcomes: See clinical impressions above.  The patient did not attend last scheduled appointment, will discharge from PT at this time   Remaining deficits: As above   Education / Equipment: HEP    Patient agrees to discharge. Patient goals were partially met. Patient is being discharged due to not returning since the last visit.  Ruben Im, PT 06/04/22 8:11 AM Phone: [phone] Fax: [phone]

## 2022-03-29 ENCOUNTER — Other Ambulatory Visit: Payer: Self-pay | Admitting: Obstetrics

## 2022-03-29 ENCOUNTER — Ambulatory Visit: Payer: Medicaid Other | Attending: Obstetrics and Gynecology

## 2022-03-29 ENCOUNTER — Ambulatory Visit: Payer: Medicaid Other | Admitting: *Deleted

## 2022-03-29 ENCOUNTER — Encounter: Payer: Self-pay | Admitting: *Deleted

## 2022-03-29 VITALS — BP 120/72 | HR 87

## 2022-03-29 DIAGNOSIS — Z3480 Encounter for supervision of other normal pregnancy, unspecified trimester: Secondary | ICD-10-CM | POA: Diagnosis not present

## 2022-03-29 DIAGNOSIS — Z363 Encounter for antenatal screening for malformations: Secondary | ICD-10-CM | POA: Diagnosis not present

## 2022-03-29 DIAGNOSIS — O09522 Supervision of elderly multigravida, second trimester: Secondary | ICD-10-CM | POA: Diagnosis not present

## 2022-03-29 DIAGNOSIS — Z3689 Encounter for other specified antenatal screening: Secondary | ICD-10-CM | POA: Insufficient documentation

## 2022-03-29 DIAGNOSIS — O321XX0 Maternal care for breech presentation, not applicable or unspecified: Secondary | ICD-10-CM | POA: Diagnosis not present

## 2022-03-29 DIAGNOSIS — Z3A19 19 weeks gestation of pregnancy: Secondary | ICD-10-CM | POA: Insufficient documentation

## 2022-03-29 DIAGNOSIS — O09292 Supervision of pregnancy with other poor reproductive or obstetric history, second trimester: Secondary | ICD-10-CM | POA: Diagnosis not present

## 2022-04-03 ENCOUNTER — Encounter: Payer: Self-pay | Admitting: Obstetrics

## 2022-04-03 ENCOUNTER — Ambulatory Visit (INDEPENDENT_AMBULATORY_CARE_PROVIDER_SITE_OTHER): Payer: Medicaid Other | Admitting: Licensed Clinical Social Worker

## 2022-04-03 ENCOUNTER — Ambulatory Visit (INDEPENDENT_AMBULATORY_CARE_PROVIDER_SITE_OTHER): Payer: Medicaid Other | Admitting: Obstetrics

## 2022-04-03 VITALS — BP 111/71 | HR 77 | Wt 145.1 lb

## 2022-04-03 DIAGNOSIS — F32A Depression, unspecified: Secondary | ICD-10-CM

## 2022-04-03 DIAGNOSIS — O0990 Supervision of high risk pregnancy, unspecified, unspecified trimester: Secondary | ICD-10-CM

## 2022-04-03 DIAGNOSIS — O09292 Supervision of pregnancy with other poor reproductive or obstetric history, second trimester: Secondary | ICD-10-CM

## 2022-04-03 DIAGNOSIS — O09522 Supervision of elderly multigravida, second trimester: Secondary | ICD-10-CM

## 2022-04-03 DIAGNOSIS — F419 Anxiety disorder, unspecified: Secondary | ICD-10-CM

## 2022-04-03 DIAGNOSIS — O09299 Supervision of pregnancy with other poor reproductive or obstetric history, unspecified trimester: Secondary | ICD-10-CM

## 2022-04-03 DIAGNOSIS — Z3A20 20 weeks gestation of pregnancy: Secondary | ICD-10-CM

## 2022-04-03 DIAGNOSIS — F17200 Nicotine dependence, unspecified, uncomplicated: Secondary | ICD-10-CM

## 2022-04-03 DIAGNOSIS — O0992 Supervision of high risk pregnancy, unspecified, second trimester: Secondary | ICD-10-CM

## 2022-04-03 NOTE — Progress Notes (Signed)
Subjective:  Katherine Barber is a 37 y.o. G3P1011 at [redacted]w[redacted]d being seen today for ongoing prenatal care.  She is currently monitored for the following issues for this high-risk pregnancy and has Migraine; Anxiety; Biceps tendonitis, right; Chronic right shoulder pain; IBS (irritable bowel syndrome); Internal derangement of shoulder, right; Moderate major depression ([place]); Neck pain; Patellar tracking disorder of right knee; Tobacco use disorder; Supervision of other normal pregnancy, antepartum; and Hx of preeclampsia on their problem list.  Patient reports no complaints.  Contractions: Not present. Vag. Bleeding: None.  Movement: Present. Denies leaking of fluid.   The following portions of the patient's history were reviewed and updated as appropriate: allergies, current medications, past family history, past medical history, past social history, past surgical history and problem list. Problem list updated.  Objective:   Vitals:   04/03/22 1030  BP: 111/71  Pulse: 77  Weight: 145 lb 1.6 oz (65.8 kg)    Fetal Status: Fetal Heart Rate (bpm): 156   Movement: Present     General:  Alert, oriented and cooperative. Patient is in no acute distress.  Skin: Skin is warm and dry. No rash noted.   Cardiovascular: Normal heart rate noted  Respiratory: Normal respiratory effort, no problems with respiration noted  Abdomen: Soft, gravid, appropriate for gestational age. Pain/Pressure: Absent     Pelvic:  Cervical exam deferred        Extremities: Normal range of motion.  Edema: None  Mental Status: Normal mood and affect. Normal behavior. Normal judgment and thought content.   Urinalysis:      Assessment and Plan:  Pregnancy: G3P1011 at [redacted]w[redacted]d  1. AMA (advanced maternal age) multigravida 84+, second trimester  2. Hx of preeclampsia - taking Baby ASA  3. Anxiety and depression - doing well with counseling and Zoloft  4. Tobacco use disorder - cessation recommended    There are no diagnoses  linked to this encounter. Preterm labor symptoms and general obstetric precautions including but not limited to vaginal bleeding, contractions, leaking of fluid and fetal movement were reviewed in detail with the patient. Please refer to After Visit Summary for other counseling recommendations.   Return in about 4 weeks (around 05/01/2022) for [facility].   Shelly Bombard, MD 04/03/2022

## 2022-04-03 NOTE — Progress Notes (Signed)
Pt presents for ROB visit. No concerns at this time.  

## 2022-04-04 ENCOUNTER — Encounter: Payer: Medicaid Other | Admitting: Physical Therapy

## 2022-04-04 NOTE — BH Specialist Note (Signed)
Integrated Behavioral Health Follow Up In-Person Visit  MRN: [id number] Name: Katherine Barber  Number of [place] Clinician visits: 1 Session Start time:  9:50am Session End time: 10:23am Total time in minutes: 33 mins in person [name]   Types of Service: Individual psychotherapy  Interpretor:No. Interpretor Name and Language: None  Subjective: Katherine Barber is a 37 y.o. female accompanied by n/a Patient was referred by Dr. Jodi Mourning for anxiety and depression. Patient reports the following symptoms/concerns: concerns with parenting differences, anxiety, limited support at home, and feeling overwhelmed Duration of problem: over one year; Severity of problem: mild  Objective: Mood: Good and Affect: Appropriate Risk of harm to self or others: No plan to harm self or others  Life Context: Family and Social: Lives in [place] with partner and son  School/Work: Employed  Self-Care: n/a Life Changes: New pregnancy  Patient and/or Family's Strengths/Protective Factors: Concrete supports in place (healthy food, safe environments, etc.)  Goals Addressed: Patient will:  Reduce symptoms of: anxiety and stress   Increase knowledge and/or ability of: coping skills and stress reduction   Demonstrate ability to: Increase adequate support systems for patient/family  Progress towards Goals: Ongoing  Interventions: Interventions utilized:  Motivational Interviewing and Supportive Counseling Standardized Assessments completed: Not Needed  Patient and/or Family Response: Katherine Barber reports increase stress and anxiety due to limited support and parenting differences with partner. Katherine Barber is employed  full time and parenting at home does not grant her enough time to engage in self care or implementing coping skills. LCSW A. Linton Rump and Katherine Barber discussed communicating need with partner for added support and developing boundaries. LCSW A. Linton Rump encouraged family  counseling with Katherine Barber to discuss parenting differences that are contributing to stress.   Assessment: Patient currently experiencing anxiety.   Patient may benefit from integrated behavioral health.  Plan: Follow up with behavioral health clinician on : 3 weeks via mychart or in person  Behavioral recommendations: Engage in self care, prioritize rest, family counseling  Referral(s): [place] (In Clinic) "From scale of 1-10, how likely are you to follow plan?":    Lynnea Ferrier, LCSW

## 2022-04-05 ENCOUNTER — Ambulatory Visit: Payer: Medicaid Other | Attending: Obstetrics and Gynecology | Admitting: Physical Therapy

## 2022-04-05 ENCOUNTER — Telehealth: Payer: Self-pay | Admitting: Physical Therapy

## 2022-04-05 DIAGNOSIS — M6281 Muscle weakness (generalized): Secondary | ICD-10-CM | POA: Insufficient documentation

## 2022-04-05 DIAGNOSIS — M5431 Sciatica, right side: Secondary | ICD-10-CM | POA: Insufficient documentation

## 2022-04-05 NOTE — Telephone Encounter (Signed)
Called regarding appt this morning.  Unable to leave message, voicemail box not set up

## 2022-04-18 ENCOUNTER — Encounter: Payer: Self-pay | Admitting: Obstetrics and Gynecology

## 2022-05-01 ENCOUNTER — Encounter: Payer: Self-pay | Admitting: Obstetrics and Gynecology

## 2022-05-01 ENCOUNTER — Ambulatory Visit (INDEPENDENT_AMBULATORY_CARE_PROVIDER_SITE_OTHER): Payer: Medicaid Other | Admitting: Obstetrics and Gynecology

## 2022-05-01 VITALS — BP 107/71 | HR 85 | Wt 148.0 lb

## 2022-05-01 DIAGNOSIS — O09299 Supervision of pregnancy with other poor reproductive or obstetric history, unspecified trimester: Secondary | ICD-10-CM

## 2022-05-01 DIAGNOSIS — Z3480 Encounter for supervision of other normal pregnancy, unspecified trimester: Secondary | ICD-10-CM

## 2022-05-01 DIAGNOSIS — O09292 Supervision of pregnancy with other poor reproductive or obstetric history, second trimester: Secondary | ICD-10-CM

## 2022-05-01 DIAGNOSIS — Z3A24 24 weeks gestation of pregnancy: Secondary | ICD-10-CM

## 2022-05-01 DIAGNOSIS — K589 Irritable bowel syndrome without diarrhea: Secondary | ICD-10-CM

## 2022-05-01 DIAGNOSIS — F321 Major depressive disorder, single episode, moderate: Secondary | ICD-10-CM

## 2022-05-01 NOTE — Progress Notes (Signed)
Subjective:  Katherine Barber is a 37 y.o. G3P1011 at 86w1dbeing seen today for ongoing prenatal care.  She is currently monitored for the following issues for this low-risk pregnancy and has Migraine; Anxiety; Biceps tendonitis, right; Chronic right shoulder pain; IBS (irritable bowel syndrome); Internal derangement of shoulder, right; Moderate major depression ([place]; Neck pain; Patellar tracking disorder of right knee; Tobacco use disorder; Supervision of other normal pregnancy, antepartum; and Hx of preeclampsia on their problem list.  Patient reports  GI concerns related to IBS .  Contractions: Not present. Vag. Bleeding: None.  Movement: Present. Denies leaking of fluid.   The following portions of the patient's history were reviewed and updated as appropriate: allergies, current medications, past family history, past medical history, past social history, past surgical history and problem list. Problem list updated.  Objective:   Vitals:   05/01/22 0836  BP: 107/71  Pulse: 85  Weight: 148 lb (67.1 kg)    Fetal Status: Fetal Heart Rate (bpm): 140   Movement: Present     General:  Alert, oriented and cooperative. Patient is in no acute distress.  Skin: Skin is warm and dry. No rash noted.   Cardiovascular: Normal heart rate noted  Respiratory: Normal respiratory effort, no problems with respiration noted  Abdomen: Soft, gravid, appropriate for gestational age. Pain/Pressure: Absent     Pelvic:  Cervical exam deferred        Extremities: Normal range of motion.     Mental Status: Normal mood and affect. Normal behavior. Normal judgment and thought content.   Urinalysis:      Assessment and Plan:  Pregnancy: G3P1011 at 252w1d1. Supervision of other normal pregnancy, antepartum Stable F/U U/S this Friday Glucola next visit  2. Hx of preeclampsia BP stable No S/Sx at present Qd BASA  3. Moderate major depression ([facility]) Stable  4. Irritable bowel syndrome, unspecified  type To see GI  Preterm labor symptoms and general obstetric precautions including but not limited to vaginal bleeding, contractions, leaking of fluid and fetal movement were reviewed in detail with the patient. Please refer to After Visit Summary for other counseling recommendations.  Return in about 4 weeks (around 05/29/2022) for OB visit, face to face, any provider, fasting for Glucola.   ErChancy MilroyMD

## 2022-05-01 NOTE — Progress Notes (Signed)
Pt has had issues with diarrhea for the last few weeks -  pt has sent mychart messages.  Pt has been dx with colitis -  was on medication.

## 2022-05-03 ENCOUNTER — Ambulatory Visit: Payer: Medicaid Other | Admitting: *Deleted

## 2022-05-03 ENCOUNTER — Ambulatory Visit: Payer: Medicaid Other | Attending: Obstetrics

## 2022-05-03 ENCOUNTER — Other Ambulatory Visit: Payer: Self-pay | Admitting: *Deleted

## 2022-05-03 VITALS — BP 120/70 | HR 77

## 2022-05-03 DIAGNOSIS — O09522 Supervision of elderly multigravida, second trimester: Secondary | ICD-10-CM

## 2022-05-03 DIAGNOSIS — Z3A24 24 weeks gestation of pregnancy: Secondary | ICD-10-CM | POA: Diagnosis not present

## 2022-05-03 DIAGNOSIS — O09292 Supervision of pregnancy with other poor reproductive or obstetric history, second trimester: Secondary | ICD-10-CM | POA: Diagnosis not present

## 2022-05-03 DIAGNOSIS — O99332 Smoking (tobacco) complicating pregnancy, second trimester: Secondary | ICD-10-CM

## 2022-05-03 DIAGNOSIS — O09299 Supervision of pregnancy with other poor reproductive or obstetric history, unspecified trimester: Secondary | ICD-10-CM

## 2022-05-29 ENCOUNTER — Ambulatory Visit (INDEPENDENT_AMBULATORY_CARE_PROVIDER_SITE_OTHER): Payer: Medicaid Other | Admitting: Obstetrics and Gynecology

## 2022-05-29 ENCOUNTER — Encounter: Payer: Self-pay | Admitting: Obstetrics and Gynecology

## 2022-05-29 ENCOUNTER — Other Ambulatory Visit: Payer: Medicaid Other

## 2022-05-29 VITALS — BP 111/78 | HR 85 | Wt 157.0 lb

## 2022-05-29 DIAGNOSIS — K589 Irritable bowel syndrome without diarrhea: Secondary | ICD-10-CM

## 2022-05-29 DIAGNOSIS — O09299 Supervision of pregnancy with other poor reproductive or obstetric history, unspecified trimester: Secondary | ICD-10-CM

## 2022-05-29 DIAGNOSIS — Z3480 Encounter for supervision of other normal pregnancy, unspecified trimester: Secondary | ICD-10-CM

## 2022-05-29 DIAGNOSIS — F419 Anxiety disorder, unspecified: Secondary | ICD-10-CM

## 2022-05-29 DIAGNOSIS — O09293 Supervision of pregnancy with other poor reproductive or obstetric history, third trimester: Secondary | ICD-10-CM

## 2022-05-29 DIAGNOSIS — Z3A28 28 weeks gestation of pregnancy: Secondary | ICD-10-CM

## 2022-05-29 NOTE — Progress Notes (Signed)
Subjective:  Katherine Barber is a 37 y.o. G3P1011 at [redacted]w[redacted]d being seen today for ongoing prenatal care.  She is currently monitored for the following issues for this high-risk pregnancy and has Migraine; Anxiety; Biceps tendonitis, right; Chronic right shoulder pain; IBS (irritable bowel syndrome); Internal derangement of shoulder, right; Moderate major depression ([place]); Neck pain; Patellar tracking disorder of right knee; Tobacco use disorder; Supervision of other normal pregnancy, antepartum; and Hx of preeclampsia on their problem list.  Patient reports  general discomforts of pregnancy .  Contractions: Not present. Vag. Bleeding: None.  Movement: Present. Denies leaking of fluid.   The following portions of the patient's history were reviewed and updated as appropriate: allergies, current medications, past family history, past medical history, past social history, past surgical history and problem list. Problem list updated.  Objective:   Vitals:   05/29/22 0939  BP: 111/78  Pulse: 85  Weight: 157 lb (71.2 kg)    Fetal Status: Fetal Heart Rate (bpm): 140   Movement: Present     General:  Alert, oriented and cooperative. Patient is in no acute distress.  Skin: Skin is warm and dry. No rash noted.   Cardiovascular: Normal heart rate noted  Respiratory: Normal respiratory effort, no problems with respiration noted  Abdomen: Soft, gravid, appropriate for gestational age. Pain/Pressure: Absent     Pelvic:  Cervical exam deferred        Extremities: Normal range of motion.     Mental Status: Normal mood and affect. Normal behavior. Normal judgment and thought content.   Urinalysis:      Assessment and Plan:  Pregnancy: G3P1011 at [redacted]w[redacted]d  1. Supervision of other normal pregnancy, antepartum Stable - Glucose Tolerance, 2 Hours w/1 Hour - RPR - CBC - HIV antibody (with reflex) Growth end of next month  2. Hx of preeclampsia BP stable No S/Sx at present  3. Anxiety Stable On  Zoloft  4. Irritable bowel syndrome, unspecified type Followed by GI  Preterm labor symptoms and general obstetric precautions including but not limited to vaginal bleeding, contractions, leaking of fluid and fetal movement were reviewed in detail with the patient. Please refer to After Visit Summary for other counseling recommendations.  Return in about 2 weeks (around 06/12/2022) for OB visit, face to face, MD only.   Chancy Milroy, MD

## 2022-05-29 NOTE — Progress Notes (Signed)
Pt complains of allergies and cough, pt is taking Claritin. Pt having carpal tunnel symptoms, using wrist brace.

## 2022-05-30 LAB — CBC
Hematocrit: 35.1 % (ref 34.0–46.6)
Hemoglobin: 12.3 g/dL (ref 11.1–15.9)
MCH: 35.8 pg — ABNORMAL HIGH (ref 26.6–33.0)
MCHC: 35 g/dL (ref 31.5–35.7)
MCV: 102 fL — ABNORMAL HIGH (ref 79–97)
Platelets: 235 10*3/uL (ref 150–450)
RBC: 3.44 x10E6/uL — ABNORMAL LOW (ref 3.77–5.28)
RDW: 12.6 % (ref 11.7–15.4)
WBC: 12 10*3/uL — ABNORMAL HIGH (ref 3.4–10.8)

## 2022-05-30 LAB — GLUCOSE TOLERANCE, 2 HOURS W/ 1HR
Glucose, 1 hour: 111 mg/dL (ref 70–179)
Glucose, 2 hour: 72 mg/dL (ref 70–152)
Glucose, Fasting: 67 mg/dL — ABNORMAL LOW (ref 70–91)

## 2022-05-30 LAB — HIV ANTIBODY (ROUTINE TESTING W REFLEX): HIV Screen 4th Generation wRfx: NONREACTIVE

## 2022-05-30 LAB — RPR: RPR Ser Ql: NONREACTIVE

## 2022-06-10 ENCOUNTER — Telehealth: Payer: Self-pay | Admitting: *Deleted

## 2022-06-10 NOTE — Telephone Encounter (Signed)
Returned pt TC. Call received over office lunch hour. Pt reported right rib pain. Pt reports she is at the MAU now.

## 2022-06-12 ENCOUNTER — Encounter: Payer: Self-pay | Admitting: Obstetrics and Gynecology

## 2022-06-12 ENCOUNTER — Ambulatory Visit (INDEPENDENT_AMBULATORY_CARE_PROVIDER_SITE_OTHER): Payer: Medicaid Other | Admitting: Obstetrics and Gynecology

## 2022-06-12 VITALS — BP 102/72 | HR 92 | Wt 159.0 lb

## 2022-06-12 DIAGNOSIS — O09299 Supervision of pregnancy with other poor reproductive or obstetric history, unspecified trimester: Secondary | ICD-10-CM

## 2022-06-12 DIAGNOSIS — Z3A30 30 weeks gestation of pregnancy: Secondary | ICD-10-CM | POA: Diagnosis not present

## 2022-06-12 DIAGNOSIS — Z3480 Encounter for supervision of other normal pregnancy, unspecified trimester: Secondary | ICD-10-CM

## 2022-06-12 DIAGNOSIS — Z23 Encounter for immunization: Secondary | ICD-10-CM | POA: Diagnosis not present

## 2022-06-12 DIAGNOSIS — O09293 Supervision of pregnancy with other poor reproductive or obstetric history, third trimester: Secondary | ICD-10-CM

## 2022-06-12 DIAGNOSIS — K589 Irritable bowel syndrome without diarrhea: Secondary | ICD-10-CM

## 2022-06-12 MED ORDER — CYCLOBENZAPRINE HCL 10 MG PO TABS
10.0000 mg | ORAL_TABLET | Freq: Three times a day (TID) | ORAL | 2 refills | Status: DC | PRN
Start: 2022-06-12 — End: 2022-07-10

## 2022-06-12 MED ORDER — GUAIFENESIN-CODEINE 100-10 MG/5ML PO SOLN
10.0000 mL | Freq: Three times a day (TID) | ORAL | 1 refills | Status: DC | PRN
Start: 2022-06-12 — End: 2022-07-18

## 2022-06-12 NOTE — Progress Notes (Signed)
Subjective:  Katherine Barber is a 37 y.o. G3P1011 at [redacted]w[redacted]d being seen today for ongoing prenatal care.  She is currently monitored for the following issues for this high-risk pregnancy and has Migraine; Anxiety; Biceps tendonitis, right; Chronic right shoulder pain; IBS (irritable bowel syndrome); Internal derangement of shoulder, right; Moderate major depression; Neck pain; Patellar tracking disorder of right knee; Tobacco use disorder; Supervision of other normal pregnancy, antepartum; and Hx of preeclampsia on their problem list.  Patient reports rib pain from pulled muscle related to coughing and allergies,  Contractions: Not present. Vag. Bleeding: None.  Movement: Present. Denies leaking of fluid.   The following portions of the patient's history were reviewed and updated as appropriate: allergies, current medications, past family history, past medical history, past social history, past surgical history and problem list. Problem list updated.  Objective:   Vitals:   06/12/22 0849  BP: 102/72  Pulse: 92  Weight: 159 lb (72.1 kg)    Fetal Status: Fetal Heart Rate (bpm): 158   Movement: Present     General:  Alert, oriented and cooperative. Patient is in no acute distress.  Skin: Skin is warm and dry. No rash noted.   Cardiovascular: Normal heart rate noted  Respiratory: Normal respiratory effort, no problems with respiration noted  Abdomen: Soft, gravid, appropriate for gestational age. Pain/Pressure: Present     Pelvic:  Cervical exam deferred        Extremities: Normal range of motion.  Edema: Trace  Mental Status: Normal mood and affect. Normal behavior. Normal judgment and thought content.   Urinalysis:      Assessment and Plan:  Pregnancy: G3P1011 at [redacted]w[redacted]d  1. Supervision of other normal pregnancy, antepartum Stable Reviewed Tx for allergies  2. Hx of preeclampsia BP stable No S/Sx at present Qd BASA  Growth scan in 2 weeks  3. Irritable bowel syndrome, unspecified  type Stable  Preterm labor symptoms and general obstetric precautions including but not limited to vaginal bleeding, contractions, leaking of fluid and fetal movement were reviewed in detail with the patient. Please refer to After Visit Summary for other counseling recommendations.  Return for OB visit, face to face, any provider.   Hermina Staggers, MD

## 2022-06-12 NOTE — Progress Notes (Signed)
ROB, c/o right side ribs pain 6/10 x 2 weeks.

## 2022-06-21 ENCOUNTER — Inpatient Hospital Stay (HOSPITAL_COMMUNITY): Payer: Medicaid Other

## 2022-06-21 ENCOUNTER — Inpatient Hospital Stay (HOSPITAL_COMMUNITY)
Admission: AD | Admit: 2022-06-21 | Discharge: 2022-06-21 | Disposition: A | Discharge status: Home / Self Care | Payer: Medicaid Other | Attending: Obstetrics and Gynecology | Admitting: Obstetrics and Gynecology

## 2022-06-21 ENCOUNTER — Encounter (HOSPITAL_COMMUNITY): Payer: Self-pay | Admitting: Obstetrics and Gynecology

## 2022-06-21 DIAGNOSIS — O26893 Other specified pregnancy related conditions, third trimester: Secondary | ICD-10-CM | POA: Insufficient documentation

## 2022-06-21 DIAGNOSIS — R0781 Pleurodynia: Secondary | ICD-10-CM | POA: Diagnosis present

## 2022-06-21 DIAGNOSIS — Z3A31 31 weeks gestation of pregnancy: Secondary | ICD-10-CM | POA: Diagnosis not present

## 2022-06-21 DIAGNOSIS — R109 Unspecified abdominal pain: Secondary | ICD-10-CM | POA: Diagnosis not present

## 2022-06-21 LAB — URINALYSIS, ROUTINE W REFLEX MICROSCOPIC
Bilirubin Urine: NEGATIVE
Glucose, UA: NEGATIVE mg/dL
Hgb urine dipstick: NEGATIVE
Ketones, ur: NEGATIVE mg/dL
Leukocytes,Ua: NEGATIVE
Nitrite: NEGATIVE
Protein, ur: NEGATIVE mg/dL
Specific Gravity, Urine: 1.005 (ref 1.005–1.030)
pH: 7 (ref 5.0–8.0)

## 2022-06-21 MED ORDER — OXYCODONE HCL 5 MG PO TABS
10.0000 mg | ORAL_TABLET | Freq: Once | ORAL | Status: AC
  Administered 2022-06-21: 10 mg via ORAL
  Filled 2022-06-21: qty 2

## 2022-06-21 MED ORDER — OXYCODONE HCL 5 MG PO CAPS: 5.0000 mg | ORAL_CAPSULE | Freq: Three times a day (TID) | ORAL | 0 refills | Status: AC | PRN

## 2022-06-21 NOTE — Discharge Instructions (Signed)
You can take Tylenol 1000 mg along with Flexeril every 8 hours as needed for pain. You have also been prescribed 3 days worth of the narcotic medication you received in MAU today. You will need to take that every 8 hours as needed for severe pain that is NOT relieved by the Tylenol and Flexeril combination does not relieve the pain.

## 2022-06-21 NOTE — MAU Provider Note (Signed)
History     CSN: [id number]  Arrival date and time: 06/21/22 1016   Event Date/Time   First Provider Initiated Contact with Patient 06/21/22 1136      Chief Complaint  Patient presents with   Chest Pain   [name] is a 37 y.o. year old G38P1011 female at [redacted]w[redacted]d weeks gestation who presents to MAU reporting RT side rib pain. She describes the pain as sharp, stabbing and burning that started 1.5 weeks ago after frequently coughing from severe allergies. She is a smoker and smoked today. She was seen at another facility and dx'd with costochondritis. She was Rx'd Flexeril 5 mg. When she was seen by Dr. Alysia Penna, he increased her dose of Flexeril. She states that dose "still doesn't work." She reports that when she bent over today, she "felt a pop" on her RT side and then the pain "became excruciating." She receives [facility] with [name]; next appt is 06/26/2022.    OB History     Gravida  3   Para  1   Term  1   Preterm  0   AB  1   Living  1      SAB  1   IAB  0   Ectopic  0   Multiple  0   Live Births  1           Past Medical History:  Diagnosis Date   Anxiety    UTI (urinary tract infection)     Past Surgical History:  Procedure Laterality Date   KNEE ARTHROSCOPY Bilateral    2 on left, 1 on right   NOSE SURGERY     PALATAL EXPANSION     SHOULDER SURGERY Right 12/01/2019   WRIST SURGERY      Family History  Problem Relation Age of Onset   Cancer Paternal Grandfather    Congestive Heart Failure Father     Social History   Tobacco Use   Smoking status: Every Day    Packs/day: .5    Types: Cigarettes   Smokeless tobacco: Never  Vaping Use   Vaping Use: Never used  Substance Use Topics   Alcohol use: Not Currently    Comment: socially- not since pregnancy   Drug use: No    Allergies: No Known Allergies  Medications Prior to Admission  Medication Sig Dispense Refill Last Dose   aspirin 81 MG chewable tablet Chew 1 tablet (81 mg  total) by mouth daily. 30 tablet 5 06/20/2022   cyclobenzaprine (FLEXERIL) 10 MG tablet Take 1 tablet (10 mg total) by mouth 3 (three) times daily as needed for muscle spasms. 30 tablet 2 06/21/2022 at 0745   guaiFENesin-codeine 100-10 MG/5ML syrup Take 10 mLs by mouth 3 (three) times daily as needed for cough. 120 mL 1 06/20/2022   Prenatal Vit-Fe Fumarate-FA (PRENATAL MULTIVITAMIN) TABS tablet Take 1 tablet by mouth at bedtime.   06/20/2022   sertraline (ZOLOFT) 25 MG tablet Take 1 tablet (25 mg total) by mouth daily. 30 tablet 5 06/20/2022   loratadine (CLARITIN REDITABS) 10 MG dissolvable tablet Take 10 mg by mouth daily. (Patient not taking: Reported on 06/21/2022)   Not Taking   Prenatal 28-0.8 MG TABS Take 1 tablet by mouth daily. 30 tablet 12     Review of Systems  Constitutional: Negative.   HENT: Negative.    Eyes: Negative.   Respiratory:  Positive for cough and shortness of breath (due to the pain).   Cardiovascular:  Negative.   Gastrointestinal: Negative.   Endocrine: Negative.   Genitourinary:  Positive for flank pain (RT).  Skin: Negative.   Allergic/Immunologic: Negative.   Neurological: Negative.   Hematological: Negative.   Psychiatric/Behavioral: Negative.     Physical Exam   Blood pressure 113/74, pulse 81, temperature 98.3 F (36.8 C), temperature source Oral, resp. rate (!) 22, last menstrual period 11/13/2021.  Physical Exam Vitals and nursing note reviewed.  Constitutional:      Appearance: Normal appearance. She is normal weight.  Cardiovascular:     Rate and Rhythm: Normal rate.  Pulmonary:     Effort: Pulmonary effort is normal.  Abdominal:     Palpations: Abdomen is soft.  Skin:    General: Skin is warm and dry.  Neurological:     Mental Status: She is alert and oriented to person, place, and time.  Psychiatric:        Mood and Affect: Mood normal.        Behavior: Behavior normal.        Thought Content: Thought content normal.        Judgment:  Judgment normal.    REACTIVE NST - FHR: 125 bpm / moderate variability / accels present / decels absent / TOCO: UI noted   MAU Course  Procedures  MDM CXR Oxycodone 10 mg -- pain improved  DG CHEST PORT 1 VIEW  Result Date: 06/21/2022 CLINICAL DATA:  [id number] Acute right flank pain [id number] EXAM: PORTABLE CHEST 1 VIEW COMPARISON:  Radiograph 05/15/2018 FINDINGS: Unchanged cardiomediastinal silhouette. There is no focal airspace consolidation. There is no large effusion or evidence of pneumothorax. There is no acute osseous abnormality. IMPRESSION: No evidence of acute cardiopulmonary disease. Electronically Signed   By: Caprice Renshaw M.D.   On: 06/21/2022 12:02    Assessment and Plan  1. Rib pain on right side - Rx: Oxycodone 5 mg every 8 hours as needed for severe pain that is NOT relieved by the Tylenol and Flexeril combination does not relieve the pain. - Advised can take Tylenol 1000 mg along with Flexeril every 8 hours as needed for pain.    2. [redacted] weeks gestation of pregnancy  - Discharge patient - Keep scheduled appt at [facility] - Patient verbalized an understanding of the plan of care and agrees.   [name], MSN, CNM 06/21/2022, 11:36 AM

## 2022-06-21 NOTE — MAU Note (Signed)
.  Katherine Barber is a 37 y.o. at [redacted]w[redacted]d here in MAU reporting: she has been having on-going right-sided rib pain that is sharp, stabbing, and burning. The pain started 1.5 weeks ago after she reports frequent coughing with allergies. She was seen in the ER then and diagnosed with a pulled muscle and given Flexeril 5 mg. The pain has since gotten worse and OB increased dose to 10 mg. She has not gotten any relief from the medication. She reports that she was bending down to get dog food this morning and felt a "pop" on that right side and the pain has become worse and is now taking her breath away.   PIH Assessment: Headache present: No  Visual disturbances: None RUQ pain/Epigastric: see note above Atypical edema: ; BLE, ; ongoing/no abrupt changes Hx of HBP: Hx of Pre-E in previous pregnancy BP Medications: bASA   Vaginal Bleeding: Vaginal Bleeding Vag. Bleeding: None  Abnormal discharge/LOF: Membranes Sac Identifier: Sac 1 Membrane Status: Intact Amount: None and Amount: None  Fetal Movement: Reports positive FM  LMP: Patient's last menstrual period was 11/13/2021 (exact date). Pain score:    Vitals:   06/21/22 1050  BP: (!) 147/87  Pulse: 88  Resp: (!) 22  Temp: 98.3 F (36.8 C)      FHT: Fetal Heart Rate Mode: External Baseline Rate (A): 145 bpm  OB Office: Faculty Lab orders placed from triage: Urinalysis

## 2022-06-26 ENCOUNTER — Encounter: Payer: Self-pay | Admitting: *Deleted

## 2022-06-26 ENCOUNTER — Ambulatory Visit (INDEPENDENT_AMBULATORY_CARE_PROVIDER_SITE_OTHER): Payer: Medicaid Other | Admitting: Family Medicine

## 2022-06-26 VITALS — BP 128/80 | HR 94 | Wt 173.8 lb

## 2022-06-26 DIAGNOSIS — Z3A32 32 weeks gestation of pregnancy: Secondary | ICD-10-CM

## 2022-06-26 DIAGNOSIS — O0990 Supervision of high risk pregnancy, unspecified, unspecified trimester: Secondary | ICD-10-CM

## 2022-06-26 DIAGNOSIS — R0781 Pleurodynia: Secondary | ICD-10-CM

## 2022-06-26 DIAGNOSIS — O09522 Supervision of elderly multigravida, second trimester: Secondary | ICD-10-CM

## 2022-06-26 DIAGNOSIS — O09299 Supervision of pregnancy with other poor reproductive or obstetric history, unspecified trimester: Secondary | ICD-10-CM

## 2022-06-26 NOTE — Progress Notes (Signed)
   PRENATAL VISIT NOTE  Subjective:  Katherine Barber is a 37 y.o. G3P1011 at [redacted]w[redacted]d being seen today for ongoing prenatal care.  She is currently monitored for the following issues for this high-risk pregnancy and has Migraine; Anxiety; Biceps tendonitis, right; Chronic right shoulder pain; IBS (irritable bowel syndrome); Internal derangement of shoulder, right; Moderate major depression; Neck pain; Patellar tracking disorder of right knee; Tobacco use disorder; Supervision of other normal pregnancy, antepartum; and Hx of preeclampsia on their problem list.  Patient reports no bleeding, no contractions, no cramping, no leaking, and having issues with right-sided rib pain which has been going on for several weeks. .  Contractions: Not present. Vag. Bleeding: None.  Movement: Present. Denies leaking of fluid.   The following portions of the patient's history were reviewed and updated as appropriate: allergies, current medications, past family history, past medical history, past social history, past surgical history and problem list.   Objective:   Vitals:   06/26/22 0906 06/26/22 0931  BP: 128/80 128/80  Pulse: 94 94  Weight: 173 lb 12.8 oz (78.8 kg) 173 lb 12.8 oz (78.8 kg)    Fetal Status: Fetal Heart Rate (bpm): 150   Movement: Present     General:  Alert, oriented and cooperative. Patient is in no acute distress.  Skin: Skin is warm and dry. No rash noted.   Cardiovascular: Normal heart rate noted  Respiratory: Normal respiratory effort, no problems with respiration noted  Abdomen: Soft, gravid, appropriate for gestational age.  Pain/Pressure: Present     Pelvic: Cervical exam deferred        Extremities: Normal range of motion.  Edema: Moderate pitting, indentation subsides rapidly  Mental Status: Normal mood and affect. Normal behavior. Normal judgment and thought content.  MSK: Patient with tenderness to multiple intercostal spaces on the right rib surface.  No tenderness with no mild  abdominal pain.  Pain with movement.  Assessment and Plan:  Pregnancy: G3P1011 at [redacted]w[redacted]d 1. Hx of preeclampsia Blood pressure is within normal limits at today's visit.  Elevated on multiple occasions at home.  Will repeat preeclampsia lab work.  Discussed signs of severe provide ED.  Patient has blood pressure check in 2 days. - Comp Met (CMET) - Protein / creatinine ratio, urine  2. Supervision of high risk pregnancy, antepartum Continue routine prenatal care  3. AMA (advanced maternal age) multigravida 35+, second trimester  4. [redacted] weeks gestation of pregnancy  5. Rib pain on right side Physical exam concerning for caustic discussed scheduled use of Tylenol for several days and reevaluation.  Also discussed use of topical medications.  Patient will try these out.  Discussed strict return precautions.  Preterm labor symptoms and general obstetric precautions including but not limited to vaginal bleeding, contractions, leaking of fluid and fetal movement were reviewed in detail with the patient. Please refer to After Visit Summary for other counseling recommendations.   No follow-ups on file.  Future Appointments  Date Time Provider Department Center  06/28/2022 10:30 AM [facility] NURSE [facility]  06/28/2022 10:45 AM [facility] US5 [facility]  07/10/2022  8:55 AM Lennart Pall, MD [facility] None  07/24/2022  8:55 AM Brock Bad, MD [facility] None  07/31/2022  8:55 AM Leftwich-Kirby, Wilmer Floor, CNM [facility] None    Celedonio Savage, MD

## 2022-06-26 NOTE — Patient Instructions (Signed)
It was a pleasure taking care of you today.  I am sorry your ribs are hurting so bad.  I still believe this is most likely costochondritis and recommend you take scheduled Tylenol 3 times a day for the next 5 days and see if your symptoms improve.  You can also continue to take the Flexeril and Oxy if needed.  Regarding your blood pressures your blood pressure was normal in the clinic today but you did have the elevated blood pressures at home.  I want you to continue to monitor these and I am going to repeat your preeclampsia lab work.  If you have any headache that do not go away with the Tylenol, worsening swelling, start seeing spots or changes in vision I want you to go to the MAU for evaluation.  If you have any questions or issues please call the clinic.  I hope you get to feeling better!

## 2022-06-26 NOTE — Progress Notes (Unsigned)
Patient presents for ROB. Patient complainss of increased swelling in her legs. She states that she has been getting elevated BP readings at home ranging in the 140s-150s. Advised patient to record those in baby rx. Denies headaches, or visual changes. Also complains of having right upper quadrant pain. No other concerns

## 2022-06-27 LAB — PROTEIN / CREATININE RATIO, URINE
Creatinine, Urine: 73.2 mg/dL
Protein, Ur: 11.4 mg/dL
Protein/Creat Ratio: 156 mg/g creat (ref 0–200)

## 2022-06-27 LAB — COMPREHENSIVE METABOLIC PANEL
ALT: 17 IU/L (ref 0–32)
AST: 25 IU/L (ref 0–40)
Albumin/Globulin Ratio: 1.9 (ref 1.2–2.2)
Albumin: 3.5 g/dL — ABNORMAL LOW (ref 3.9–4.9)
Alkaline Phosphatase: 153 IU/L — ABNORMAL HIGH (ref 44–121)
BUN/Creatinine Ratio: 15 (ref 9–23)
BUN: 7 mg/dL (ref 6–20)
Bilirubin Total: 0.2 mg/dL (ref 0.0–1.2)
CO2: 19 mmol/L — ABNORMAL LOW (ref 20–29)
Calcium: 9.3 mg/dL (ref 8.7–10.2)
Chloride: 105 mmol/L (ref 96–106)
Creatinine, Ser: 0.48 mg/dL — ABNORMAL LOW (ref 0.57–1.00)
Globulin, Total: 1.8 g/dL (ref 1.5–4.5)
Glucose: 62 mg/dL — ABNORMAL LOW (ref 70–99)
Potassium: 4.1 mmol/L (ref 3.5–5.2)
Sodium: 137 mmol/L (ref 134–144)
Total Protein: 5.3 g/dL — ABNORMAL LOW (ref 6.0–8.5)
eGFR: 127 mL/min/{1.73_m2} (ref >59)

## 2022-06-28 ENCOUNTER — Ambulatory Visit: Payer: Medicaid Other | Attending: Obstetrics

## 2022-06-28 ENCOUNTER — Ambulatory Visit: Payer: Medicaid Other | Admitting: *Deleted

## 2022-06-28 ENCOUNTER — Other Ambulatory Visit: Payer: Self-pay | Admitting: *Deleted

## 2022-06-28 VITALS — BP 130/89 | HR 102

## 2022-06-28 DIAGNOSIS — O09523 Supervision of elderly multigravida, third trimester: Secondary | ICD-10-CM

## 2022-06-28 DIAGNOSIS — O09522 Supervision of elderly multigravida, second trimester: Secondary | ICD-10-CM | POA: Diagnosis present

## 2022-06-28 DIAGNOSIS — Z3A32 32 weeks gestation of pregnancy: Secondary | ICD-10-CM | POA: Diagnosis not present

## 2022-06-28 DIAGNOSIS — O09293 Supervision of pregnancy with other poor reproductive or obstetric history, third trimester: Secondary | ICD-10-CM

## 2022-06-28 DIAGNOSIS — O99333 Smoking (tobacco) complicating pregnancy, third trimester: Secondary | ICD-10-CM

## 2022-06-28 DIAGNOSIS — O99332 Smoking (tobacco) complicating pregnancy, second trimester: Secondary | ICD-10-CM

## 2022-06-28 DIAGNOSIS — O09299 Supervision of pregnancy with other poor reproductive or obstetric history, unspecified trimester: Secondary | ICD-10-CM | POA: Diagnosis present

## 2022-07-01 ENCOUNTER — Encounter: Payer: Self-pay | Admitting: Family Medicine

## 2022-07-08 ENCOUNTER — Telehealth: Payer: Self-pay | Admitting: Obstetrics & Gynecology

## 2022-07-08 NOTE — Telephone Encounter (Signed)
Called pt to review phone call from babyscripts.  Pt notes BP 161/91, but then repeat 142/86.  She denies headache, blurry vision or right upper quadrant pain.  Her only symptom is swelling.  She notes good fetal movement  Of note on Friday she was recently seen at [facility] care.  She had presented for similar concerns including elevated blood pressure, headache and decreased fetal movement.  It sounds as though evaluation was done and she was diagnosed with gestational hypertension.  Discussed diagnosis of gestational hypertension with plans for antepartum testing and induction at 37 weeks.  Reviewed gestational hypertension versus preeclampsia, and the fine balance between the two.  Encouraged patient that should she become symptomatic, BPs remain elevated above 160/110s, or other acute changes she should immediately come to MAU.  Reassured patient that she may have a few visits to either rule in or out preeclampsia prior to delivery.  Questions and concerns were addressed.  Patient voiced good understanding  Message sent to office staff and Dr. Berton Lan as her next appointment is this upcoming Wednesday at [facility] and will need to be scheduled for antepartum testing.  Myna Hidalgo, DO Attending Obstetrician & Gynecologist, [facility] for [employer], [facility]

## 2022-07-09 ENCOUNTER — Encounter: Payer: Self-pay | Admitting: Obstetrics and Gynecology

## 2022-07-09 ENCOUNTER — Other Ambulatory Visit: Payer: Self-pay | Admitting: Obstetrics

## 2022-07-09 DIAGNOSIS — O09299 Supervision of pregnancy with other poor reproductive or obstetric history, unspecified trimester: Secondary | ICD-10-CM

## 2022-07-09 DIAGNOSIS — F419 Anxiety disorder, unspecified: Secondary | ICD-10-CM

## 2022-07-10 ENCOUNTER — Inpatient Hospital Stay (HOSPITAL_BASED_OUTPATIENT_CLINIC_OR_DEPARTMENT_OTHER): Payer: Medicaid Other

## 2022-07-10 ENCOUNTER — Encounter (HOSPITAL_COMMUNITY): Payer: Self-pay | Admitting: Obstetrics and Gynecology

## 2022-07-10 ENCOUNTER — Ambulatory Visit: Payer: Medicaid Other | Admitting: Obstetrics and Gynecology

## 2022-07-10 ENCOUNTER — Inpatient Hospital Stay (HOSPITAL_COMMUNITY)
Admission: AD | Admit: 2022-07-10 | Discharge: 2022-07-10 | Disposition: A | Discharge status: Home / Self Care | Payer: Medicaid Other | Attending: Obstetrics and Gynecology | Admitting: Obstetrics and Gynecology

## 2022-07-10 VITALS — BP 138/92 | HR 80 | Wt 173.0 lb

## 2022-07-10 DIAGNOSIS — O133 Gestational [pregnancy-induced] hypertension without significant proteinuria, third trimester: Secondary | ICD-10-CM

## 2022-07-10 DIAGNOSIS — F321 Major depressive disorder, single episode, moderate: Secondary | ICD-10-CM

## 2022-07-10 DIAGNOSIS — O99343 Other mental disorders complicating pregnancy, third trimester: Secondary | ICD-10-CM

## 2022-07-10 DIAGNOSIS — F17200 Nicotine dependence, unspecified, uncomplicated: Secondary | ICD-10-CM

## 2022-07-10 DIAGNOSIS — Z3A34 34 weeks gestation of pregnancy: Secondary | ICD-10-CM | POA: Diagnosis not present

## 2022-07-10 DIAGNOSIS — O99333 Smoking (tobacco) complicating pregnancy, third trimester: Secondary | ICD-10-CM | POA: Insufficient documentation

## 2022-07-10 DIAGNOSIS — Z3480 Encounter for supervision of other normal pregnancy, unspecified trimester: Secondary | ICD-10-CM

## 2022-07-10 DIAGNOSIS — O09523 Supervision of elderly multigravida, third trimester: Secondary | ICD-10-CM

## 2022-07-10 DIAGNOSIS — F419 Anxiety disorder, unspecified: Secondary | ICD-10-CM

## 2022-07-10 DIAGNOSIS — Z3689 Encounter for other specified antenatal screening: Secondary | ICD-10-CM

## 2022-07-10 DIAGNOSIS — O09293 Supervision of pregnancy with other poor reproductive or obstetric history, third trimester: Secondary | ICD-10-CM | POA: Diagnosis not present

## 2022-07-10 LAB — COMPREHENSIVE METABOLIC PANEL
ALT: 13 U/L (ref 0–44)
AST: 23 U/L (ref 15–41)
Albumin: 2.8 g/dL — ABNORMAL LOW (ref 3.5–5.0)
Alkaline Phosphatase: 142 U/L — ABNORMAL HIGH (ref 38–126)
Anion gap: 10 (ref 5–15)
BUN: 5 mg/dL — ABNORMAL LOW (ref 6–20)
CO2: 23 mmol/L (ref 22–32)
Calcium: 9.5 mg/dL (ref 8.9–10.3)
Chloride: 106 mmol/L (ref 98–111)
Creatinine, Ser: 0.6 mg/dL (ref 0.44–1.00)
GFR, Estimated: >60 mL/min (ref >60)
Glucose, Bld: 98 mg/dL (ref 70–99)
Potassium: 4.2 mmol/L (ref 3.5–5.1)
Sodium: 139 mmol/L (ref 135–145)
Total Bilirubin: 0.7 mg/dL (ref 0.3–1.2)
Total Protein: 5.8 g/dL — ABNORMAL LOW (ref 6.5–8.1)

## 2022-07-10 LAB — CBC WITH DIFFERENTIAL/PLATELET
Abs Immature Granulocytes: 0.12 10*3/uL — ABNORMAL HIGH (ref 0.00–0.07)
Basophils Absolute: 0.1 10*3/uL (ref 0.0–0.1)
Basophils Relative: 0 %
Eosinophils Absolute: 0.1 10*3/uL (ref 0.0–0.5)
Eosinophils Relative: 1 %
HCT: 37.8 % (ref 36.0–46.0)
Hemoglobin: 13.3 g/dL (ref 12.0–15.0)
Immature Granulocytes: 1 %
Lymphocytes Relative: 21 %
Lymphs Abs: 2.6 10*3/uL (ref 0.7–4.0)
MCH: 36 pg — ABNORMAL HIGH (ref 26.0–34.0)
MCHC: 35.2 g/dL (ref 30.0–36.0)
MCV: 102.4 fL — ABNORMAL HIGH (ref 80.0–100.0)
Monocytes Absolute: 0.7 10*3/uL (ref 0.1–1.0)
Monocytes Relative: 5 %
Neutro Abs: 8.8 10*3/uL — ABNORMAL HIGH (ref 1.7–7.7)
Neutrophils Relative %: 72 %
Platelets: 263 10*3/uL (ref 150–400)
RBC: 3.69 MIL/uL — ABNORMAL LOW (ref 3.87–5.11)
RDW: 13.4 % (ref 11.5–15.5)
WBC: 12.4 10*3/uL — ABNORMAL HIGH (ref 4.0–10.5)
nRBC: 0 % (ref 0.0–0.2)

## 2022-07-10 LAB — PROTEIN / CREATININE RATIO, URINE
Creatinine, Urine: 31 mg/dL
Total Protein, Urine: <6 mg/dL

## 2022-07-10 MED ORDER — ACETAMINOPHEN-CAFFEINE 500-65 MG PO TABS: 2.0000 | ORAL_TABLET | Freq: Four times a day (QID) | ORAL | 0 refills | Status: DC | PRN

## 2022-07-10 MED ORDER — ACETAMINOPHEN-CAFFEINE 500-65 MG PO TABS
2.0000 | ORAL_TABLET | Freq: Once | ORAL | Status: AC
  Administered 2022-07-10: 2 via ORAL
  Filled 2022-07-10: qty 2

## 2022-07-10 NOTE — Progress Notes (Addendum)
   PRENATAL VISIT NOTE  Subjective:  Katherine Barber is a 37 y.o. G3P1011 at [redacted]w[redacted]d being seen today for ongoing prenatal care.  She is currently monitored for the following issues for this high-risk pregnancy and has Anxiety; Internal derangement of shoulder, right; Moderate major depression (HCC); Tobacco use disorder; Supervision of other normal pregnancy, antepartum; and Gestational hypertension on their problem list.  Patient reports  pedal edema . Had SR BP at home this AM (170/100). Denies HA, visual changes, CP/SOB, RUQ/epigastric pain.  Contractions: Not present. Vag. Bleeding: None.  Movement: Present. Denies leaking of fluid.   The following portions of the patient's history were reviewed and updated as appropriate: allergies, current medications, past family history, past medical history, past social history, past surgical history and problem list.   Objective:   Vitals:   07/10/22 0859 07/10/22 0942  BP: (!) 156/97 (!) 138/92  Pulse: (!) 101 80  Weight: 173 lb (78.5 kg)    Fetal Status:     Movement: Present     General:  Alert, oriented and cooperative. Patient is in no acute distress.  Skin: Skin is warm and dry. No rash noted.   Cardiovascular: Normal heart rate noted  Respiratory: Normal respiratory effort, no problems with respiration noted  Abdomen: Soft, gravid, appropriate for gestational age.  Pain/Pressure: Absent     NST with baseline 135, moderate variability, 1 accel, 1 variable decel  Assessment and Plan:  Pregnancy: G3P1011 at [redacted]w[redacted]d 1. Supervision of other normal pregnancy, antepartum 2. [redacted] weeks gestation of pregnancy  3. Gestational hypertension, third trimester Mild range BP today, no HA/vis changes/CP/SOB/RUQ or epigastric pain Growth [place] 4/26 1846g (23%), next scheduled 5/21 Plan for weekly labs w/ 2x/wk NSTs or BPP/NST Given NRNST today despite of monitoring, will send to MAU for prolonged monitoring and/or BPP. Will obtain weekly serum labs at  MAU.  IOL ordered for 37 weeks Counseled pt about potential delivery sooner if severe preE, reviewed indications to present to MAU for care   4. Tobacco use disorder  5. Moderate major depression (HCC) 6. Anxiety Continue zoloft  Given her NRNST, will send to MAU for prolonged monitoring/BPP, BP monitoring, and serum labs. MAU providers notified.  Return in about 1 week (around 07/17/2022) for return OB at 35 weeks with NST & CBC/CMP.  Future Appointments  Date Time Provider Department Center  07/23/2022  3:45 PM [facility] US6 [facility]  07/24/2022  8:55 AM Brock Bad, MD [facility] None  07/31/2022  8:55 AM Hurshel Party, CNM [facility] None  08/02/2022 10:45 AM [facility] NST [facility]  08/09/2022 10:45 AM [facility] NST [facility]  08/16/2022 10:45 AM [facility] NST [facility]    Lennart Pall, MD

## 2022-07-10 NOTE — Addendum Note (Signed)
Addended by: Harvie Bridge on: 07/10/2022 12:14 PM   Modules accepted: Orders

## 2022-07-10 NOTE — Progress Notes (Signed)
Pt states BP has continued to be elevated at home, elevated in office today.  Pt states she has increase in swelling and occ HA's.

## 2022-07-10 NOTE — MAU Provider Note (Signed)
History     CSN: [id number]  Arrival date and time: 07/10/22 1054   Event Date/Time   First Provider Initiated Contact with Patient 07/10/22 1135      Chief Complaint  Patient presents with   Hypertension   Katherine Barber is a 37 y.o. G3P1011 at [redacted]w[redacted]d who presents today from the office for prolonged monitoring. She has known GHTN this pregnancy and had a NST in the office. Per MD in the office NST was not reactive, so patient sent here for prolonged monitoring. She is also due for weekly labs, so wanted labs collected here today. She reports a headache that is typical for her headaches that she gets. She feels it could be related to not eating anything yet today, but she had a sinus infection last week. Since then she has had more headaches that have not been relieved with tylenol. She denies any contractions, VB or  LOF. She reports normal fetal movement. Patient has a history of pre-eclampsia with prior pregnancies.   Hypertension    OB History     Gravida  3   Para  1   Term  1   Preterm  0   AB  1   Living  1      SAB  1   IAB  0   Ectopic  0   Multiple  0   Live Births  1           Past Medical History:  Diagnosis Date   Anxiety    Biceps tendonitis, right 11/22/2019   Formatting of this note might be different from the original. Added automatically from request for surgery [id number]   Chronic right shoulder pain 07/27/2019   IBS (irritable bowel syndrome) 09/03/2019   Migraine 01/15/2022   Neck pain 01/15/2022   Patellar tracking disorder of right knee 05/08/2014   UTI (urinary tract infection)     Past Surgical History:  Procedure Laterality Date   KNEE ARTHROSCOPY Bilateral    2 on left, 1 on right   NOSE SURGERY     PALATAL EXPANSION     SHOULDER SURGERY Right 12/01/2019   WRIST SURGERY      Family History  Problem Relation Age of Onset   Cancer Paternal Grandfather    Congestive Heart Failure Father     Social History    Tobacco Use   Smoking status: Every Day    Packs/day: .25    Types: Cigarettes   Smokeless tobacco: Never  Vaping Use   Vaping Use: Never used  Substance Use Topics   Alcohol use: Not Currently    Comment: socially- not since pregnancy   Drug use: No    Allergies: No Known Allergies  Medications Prior to Admission  Medication Sig Dispense Refill Last Dose   ASPIRIN LOW DOSE 81 MG chewable tablet CHEW & SWALLOW 1 TABLET BY MOUTH ONCE A DAY 30 tablet 5 07/09/2022   Prenatal 28-0.8 MG TABS Take 1 tablet by mouth daily. 30 tablet 12 07/09/2022   guaiFENesin-codeine 100-10 MG/5ML syrup Take 10 mLs by mouth 3 (three) times daily as needed for cough. 120 mL 1    loratadine (CLARITIN REDITABS) 10 MG dissolvable tablet Take 10 mg by mouth daily.      sertraline (ZOLOFT) 25 MG tablet TAKE 1 TABLET BY MOUTH DAILY 30 tablet 5 07/06/2022    Review of Systems  All other systems reviewed and are negative.  Physical Exam   Blood pressure 124/89,  pulse 94, temperature 98.4 F (36.9 C), resp. rate 17, height 5\' 3"  (1.6 m), weight 77.7 kg, last menstrual period 11/13/2021, SpO2 97 %.  Physical Exam Vitals and nursing note reviewed.  Constitutional:      General: She is not in acute distress. HENT:     Head: Normocephalic.  Eyes:     Pupils: Pupils are equal, round, and reactive to light.  Cardiovascular:     Rate and Rhythm: Normal rate.  Pulmonary:     Effort: Pulmonary effort is normal.  Abdominal:     Palpations: Abdomen is soft.     Tenderness: There is no abdominal tenderness.  Musculoskeletal:     Right lower leg: Edema present.     Left lower leg: Edema present.  Skin:    General: Skin is warm and dry.  Neurological:     Mental Status: She is alert and oriented to person, place, and time.  Psychiatric:        Mood and Affect: Mood normal.        Behavior: Behavior normal.     NST:  Baseline: 140 Variability: moderate Accels: 15x15 Decels: none Toco:  none Reactive/Appropriate for GA   Results for orders placed or performed during the hospital encounter of 07/10/22 (from the past 24 hour(s))  CBC with Differential/Platelet     Status: Abnormal   Collection Time: 07/10/22 11:09 AM  Result Value Ref Range   WBC 12.4 (H) 4.0 - 10.5 K/uL   RBC 3.69 (L) 3.87 - 5.11 MIL/uL   Hemoglobin 13.3 12.0 - 15.0 g/dL   HCT 16.1 09.6 - 04.5 %   MCV 102.4 (H) 80.0 - 100.0 fL   MCH 36.0 (H) 26.0 - 34.0 pg   MCHC 35.2 30.0 - 36.0 g/dL   RDW 40.9 81.1 - 91.4 %   Platelets 263 150 - 400 K/uL   nRBC 0.0 0.0 - 0.2 %   Neutrophils Relative % 72 %   Neutro Abs 8.8 (H) 1.7 - 7.7 K/uL   Lymphocytes Relative 21 %   Lymphs Abs 2.6 0.7 - 4.0 K/uL   Monocytes Relative 5 %   Monocytes Absolute 0.7 0.1 - 1.0 K/uL   Eosinophils Relative 1 %   Eosinophils Absolute 0.1 0.0 - 0.5 K/uL   Basophils Relative 0 %   Basophils Absolute 0.1 0.0 - 0.1 K/uL   Immature Granulocytes 1 %   Abs Immature Granulocytes 0.12 (H) 0.00 - 0.07 K/uL  Comprehensive metabolic panel     Status: Abnormal   Collection Time: 07/10/22 11:09 AM  Result Value Ref Range   Sodium 139 135 - 145 mmol/L   Potassium 4.2 3.5 - 5.1 mmol/L   Chloride 106 98 - 111 mmol/L   CO2 23 22 - 32 mmol/L   Glucose, Bld 98 70 - 99 mg/dL   BUN 5 (L) 6 - 20 mg/dL   Creatinine, Ser 7.82 0.44 - 1.00 mg/dL   Calcium 9.5 8.9 - 95.6 mg/dL   Total Protein 5.8 (L) 6.5 - 8.1 g/dL   Albumin 2.8 (L) 3.5 - 5.0 g/dL   AST 23 15 - 41 U/L   ALT 13 0 - 44 U/L   Alkaline Phosphatase 142 (H) 38 - 126 U/L   Total Bilirubin 0.7 0.3 - 1.2 mg/dL   GFR, Estimated >21 >30 mL/min   Anion gap 10 5 - 15  Protein / creatinine ratio, urine     Status: None   Collection Time: 07/10/22 11:14 AM  Result Value Ref Range   Creatinine, Urine 31 mg/dL   Total Protein, Urine <6 mg/dL   Protein Creatinine Ratio        0.00 - 0.15 mg/mg[Cre]   Patient Vitals for the past 24 hrs:  BP Temp Pulse Resp SpO2 Height Weight  07/10/22 1230  124/89 -- 94 -- -- -- --  07/10/22 1215 131/83 -- 86 -- -- -- --  07/10/22 1200 129/88 -- 91 -- 97 % -- --  07/10/22 1145 (!) 146/90 -- 87 -- 98 % -- --  07/10/22 1130 134/82 -- 99 -- 98 % -- --  07/10/22 1122 (!) 142/92 98.4 F (36.9 C) (!) 106 17 99 % -- --  07/10/22 1105 -- -- -- -- -- 5\' 3"  (1.6 m) 77.7 kg     MAU Course  Procedures  MDM BPP: cephalic, 8/8 +Reactive NST = 16/10 Patient with continued mild range BPs Headache resolved with tylenol/caffeine and meal provided  Labs normal today Plan outpatient FU as already planned with weekly labs and testing, delivery at 37 weeks unless condition worsens.  Assessment and Plan   1. Supervision of other normal pregnancy, antepartum   2. Gestational hypertension w/o significant proteinuria in 3rd trimester   3. [redacted] weeks gestation of pregnancy   4. NST (non-stress test) reactive    DC home in stable condition  Pre-eclampsia danger signs reviewed  3rd Trimester precautions  PTL precautions  Fetal kick counts RX: tylenol/caffeine PRN  Return to MAU as needed FU with OB as planned   Follow-up Information     [facility] Follow up.   Why: As scheduled Contact information: [address] [place] [address] [phone]               Thressa Sheller DNP, CNM  07/10/22  1:40 PM

## 2022-07-10 NOTE — MAU Note (Signed)
.  Katherine Barber is a 37 y.o. at [redacted]w[redacted]d here in MAU reporting: she was sent over from the office due to elevated b/p. Pt reports increased swelling for the last month and a half. Headache x one hour and thinks it may be due to the fact she has not eaten. Denies RUQ pain or blurred vision. Reports positive fetal movement.  Onset of complaint: today Pain score: 2/10 Vitals:   07/10/22 1122  BP: (!) 142/92  Pulse: (!) 106  Resp: 17  Temp: 98.4 F (36.9 C)  SpO2: 99%     FHT:151 Lab orders placed from triage:

## 2022-07-15 ENCOUNTER — Ambulatory Visit (INDEPENDENT_AMBULATORY_CARE_PROVIDER_SITE_OTHER): Payer: Medicaid Other

## 2022-07-15 VITALS — BP 139/94 | HR 101 | Ht 63.0 in | Wt 170.7 lb

## 2022-07-15 DIAGNOSIS — Z3480 Encounter for supervision of other normal pregnancy, unspecified trimester: Secondary | ICD-10-CM

## 2022-07-15 DIAGNOSIS — Z3483 Encounter for supervision of other normal pregnancy, third trimester: Secondary | ICD-10-CM | POA: Diagnosis not present

## 2022-07-15 DIAGNOSIS — Z3A34 34 weeks gestation of pregnancy: Secondary | ICD-10-CM | POA: Diagnosis not present

## 2022-07-15 NOTE — Progress Notes (Signed)
Patient presents for NST for GHTN.   No concerns today. Next NST scheduled for 5/16.  15x15 Reactive  FHR 140

## 2022-07-18 ENCOUNTER — Other Ambulatory Visit (HOSPITAL_COMMUNITY)
Admission: RE | Admit: 2022-07-18 | Discharge: 2022-07-18 | Disposition: A | Discharge status: Home / Self Care | Payer: Medicaid Other | Source: Ambulatory Visit | Attending: Obstetrics and Gynecology | Admitting: Obstetrics and Gynecology

## 2022-07-18 ENCOUNTER — Ambulatory Visit (INDEPENDENT_AMBULATORY_CARE_PROVIDER_SITE_OTHER): Payer: Medicaid Other | Admitting: Obstetrics and Gynecology

## 2022-07-18 ENCOUNTER — Encounter: Payer: Self-pay | Admitting: Obstetrics and Gynecology

## 2022-07-18 VITALS — BP 142/97 | HR 98 | Wt 172.0 lb

## 2022-07-18 DIAGNOSIS — O133 Gestational [pregnancy-induced] hypertension without significant proteinuria, third trimester: Secondary | ICD-10-CM

## 2022-07-18 DIAGNOSIS — Z3480 Encounter for supervision of other normal pregnancy, unspecified trimester: Secondary | ICD-10-CM | POA: Insufficient documentation

## 2022-07-18 DIAGNOSIS — Z3A35 35 weeks gestation of pregnancy: Secondary | ICD-10-CM

## 2022-07-18 NOTE — Progress Notes (Signed)
Subjective:  Katherine Barber is a 37 y.o. G3P1011 at [redacted]w[redacted]d being seen today for ongoing prenatal care.  She is currently monitored for the following issues for this high-risk pregnancy and has Anxiety; Internal derangement of shoulder, right; Moderate major depression (HCC); Tobacco use disorder; Supervision of other normal pregnancy, antepartum; and Gestational hypertension on their problem list.  Patient reports  general discomforts of pregnancy .  Contractions: Not present. Vag. Bleeding: None.  Movement: Present. Denies leaking of fluid. Denies HA or visual changes  The following portions of the patient's history were reviewed and updated as appropriate: allergies, current medications, past family history, past medical history, past social history, past surgical history and problem list. Problem list updated.  Objective:   Vitals:   07/18/22 0938  BP: (!) 142/97  Pulse: 98  Weight: 172 lb (78 kg)    Fetal Status: Fetal Heart Rate (bpm): NST   Movement: Present     General:  Alert, oriented and cooperative. Patient is in no acute distress.  Skin: Skin is warm and dry. No rash noted.   Cardiovascular: Normal heart rate noted  Respiratory: Normal respiratory effort, no problems with respiration noted  Abdomen: Soft, gravid, appropriate for gestational age. Pain/Pressure: Present     Pelvic:  Cervical exam performed        Extremities: Normal range of motion.     Mental Status: Normal mood and affect. Normal behavior. Normal judgment and thought content.   Urinalysis:      Assessment and Plan:  Pregnancy: G3P1011 at [redacted]w[redacted]d  1. Supervision of other normal pregnancy, antepartum Labor precautions - Culture, beta strep (group b only) - Cervicovaginal ancillary only( [place])  2. Gestational hypertension, third trimester Stable NST, 130-140's, + accels, no decels, reactive - Fetal nonstress test - Protein / creatinine ratio, urine - CBC with Differential/Platelet IOL  scheduled  Term labor symptoms and general obstetric precautions including but not limited to vaginal bleeding, contractions, leaking of fluid and fetal movement were reviewed in detail with the patient. Please refer to After Visit Summary for other counseling recommendations.  Return in about 1 week (around 07/25/2022) for OB visit, face to face, MD only.   Hermina Staggers, MD

## 2022-07-19 LAB — CERVICOVAGINAL ANCILLARY ONLY
Bacterial Vaginitis (gardnerella): NEGATIVE
Candida Glabrata: NEGATIVE
Candida Vaginitis: NEGATIVE
Chlamydia: NEGATIVE
Comment: NEGATIVE
Comment: NEGATIVE
Comment: NEGATIVE
Comment: NEGATIVE
Comment: NEGATIVE
Comment: NORMAL
Neisseria Gonorrhea: NEGATIVE
Trichomonas: NEGATIVE

## 2022-07-19 LAB — CBC WITH DIFFERENTIAL/PLATELET
Basophils Absolute: 0 10*3/uL (ref 0.0–0.2)
Basos: 0 %
EOS (ABSOLUTE): 0.2 10*3/uL (ref 0.0–0.4)
Eos: 1 %
Hematocrit: 39.3 % (ref 34.0–46.6)
Hemoglobin: 13.3 g/dL (ref 11.1–15.9)
Immature Grans (Abs): 0.1 10*3/uL (ref 0.0–0.1)
Immature Granulocytes: 1 %
Lymphocytes Absolute: 2.7 10*3/uL (ref 0.7–3.1)
Lymphs: 23 %
MCH: 35.6 pg — ABNORMAL HIGH (ref 26.6–33.0)
MCHC: 33.8 g/dL (ref 31.5–35.7)
MCV: 105 fL — ABNORMAL HIGH (ref 79–97)
Monocytes Absolute: 0.8 10*3/uL (ref 0.1–0.9)
Monocytes: 6 %
Neutrophils Absolute: 8.1 10*3/uL — ABNORMAL HIGH (ref 1.4–7.0)
Neutrophils: 69 %
Platelets: 214 10*3/uL (ref 150–450)
RBC: 3.74 x10E6/uL — ABNORMAL LOW (ref 3.77–5.28)
RDW: 13.4 % (ref 11.7–15.4)
WBC: 11.9 10*3/uL — ABNORMAL HIGH (ref 3.4–10.8)

## 2022-07-19 LAB — PROTEIN / CREATININE RATIO, URINE
Creatinine, Urine: 67.4 mg/dL
Protein, Ur: 13.3 mg/dL
Protein/Creat Ratio: 197 mg/g creat (ref 0–200)

## 2022-07-22 LAB — CULTURE, BETA STREP (GROUP B ONLY): Strep Gp B Culture: NEGATIVE

## 2022-07-23 ENCOUNTER — Ambulatory Visit: Payer: Medicaid Other | Attending: Maternal & Fetal Medicine

## 2022-07-23 VITALS — BP 155/99 | HR 87

## 2022-07-23 DIAGNOSIS — F17210 Nicotine dependence, cigarettes, uncomplicated: Secondary | ICD-10-CM | POA: Diagnosis not present

## 2022-07-23 DIAGNOSIS — O09513 Supervision of elderly primigravida, third trimester: Secondary | ICD-10-CM | POA: Insufficient documentation

## 2022-07-23 DIAGNOSIS — O133 Gestational [pregnancy-induced] hypertension without significant proteinuria, third trimester: Secondary | ICD-10-CM

## 2022-07-23 DIAGNOSIS — O99333 Smoking (tobacco) complicating pregnancy, third trimester: Secondary | ICD-10-CM

## 2022-07-23 DIAGNOSIS — O09293 Supervision of pregnancy with other poor reproductive or obstetric history, third trimester: Secondary | ICD-10-CM

## 2022-07-23 DIAGNOSIS — O09523 Supervision of elderly multigravida, third trimester: Secondary | ICD-10-CM | POA: Diagnosis not present

## 2022-07-23 DIAGNOSIS — Z3480 Encounter for supervision of other normal pregnancy, unspecified trimester: Secondary | ICD-10-CM

## 2022-07-23 DIAGNOSIS — Z3A36 36 weeks gestation of pregnancy: Secondary | ICD-10-CM | POA: Diagnosis not present

## 2022-07-23 DIAGNOSIS — Z362 Encounter for other antenatal screening follow-up: Secondary | ICD-10-CM | POA: Insufficient documentation

## 2022-07-24 ENCOUNTER — Ambulatory Visit (INDEPENDENT_AMBULATORY_CARE_PROVIDER_SITE_OTHER): Payer: Medicaid Other | Admitting: Obstetrics & Gynecology

## 2022-07-24 ENCOUNTER — Encounter: Payer: Self-pay | Admitting: Obstetrics & Gynecology

## 2022-07-24 ENCOUNTER — Encounter: Payer: Self-pay | Admitting: Obstetrics and Gynecology

## 2022-07-24 ENCOUNTER — Other Ambulatory Visit: Payer: Self-pay | Admitting: Advanced Practice Midwife

## 2022-07-24 ENCOUNTER — Encounter: Payer: Medicaid Other | Admitting: Obstetrics

## 2022-07-24 VITALS — BP 153/95 | HR 91 | Wt 177.0 lb

## 2022-07-24 DIAGNOSIS — O133 Gestational [pregnancy-induced] hypertension without significant proteinuria, third trimester: Secondary | ICD-10-CM

## 2022-07-24 DIAGNOSIS — Z3480 Encounter for supervision of other normal pregnancy, unspecified trimester: Secondary | ICD-10-CM

## 2022-07-24 NOTE — Progress Notes (Signed)
   PRENATAL VISIT NOTE  Subjective:  Katherine Barber is a 37 y.o. G3P1011 at [redacted]w[redacted]d being seen today for ongoing prenatal care.  She is currently monitored for the following issues for this high-risk pregnancy and has Anxiety; Internal derangement of shoulder, right; Moderate major depression (HCC); Tobacco use disorder; Supervision of other normal pregnancy, antepartum; and Gestational hypertension on their problem list.  Patient reports  BP at home is elevated but decreased when repeated .  Contractions: Not present. Vag. Bleeding: None.  Movement: Present. Denies leaking of fluid.   The following portions of the patient's history were reviewed and updated as appropriate: allergies, current medications, past family history, past medical history, past social history, past surgical history and problem list.   Objective:  152/95 on repeat Vitals:   07/24/22 0856 07/24/22 0926  BP: (!) 165/96 (!) 153/95  Pulse: 91 91  Weight: 177 lb (80.3 kg)     Fetal Status: Fetal Heart Rate (bpm): 145   Movement: Present     General:  Alert, oriented and cooperative. Patient is in no acute distress.  Skin: Skin is warm and dry. No rash noted.   Cardiovascular: Normal heart rate noted  Respiratory: Normal respiratory effort, no problems with respiration noted  Abdomen: Soft, gravid, appropriate for gestational age.  Pain/Pressure: Present     Pelvic: Cervical exam deferred        Extremities: Normal range of motion.     Mental Status: Normal mood and affect. Normal behavior. Normal judgment and thought content.   Assessment and Plan:  Pregnancy: G3P1011 at [redacted]w[redacted]d 1. Gestational hypertension, third trimester Repeat labs and follow bp, has IOL scheduled 37 weeks - CBC - Comp Met (CMET) - Protein / creatinine ratio, urine  2. Supervision of other normal pregnancy, antepartum Adequate growth and BPP 8/8  Preterm labor symptoms and general obstetric precautions including but not limited to vaginal  bleeding, contractions, leaking of fluid and fetal movement were reviewed in detail with the patient. Please refer to After Visit Summary for other counseling recommendations.   Return in about 2 days (around 07/26/2022) for virtual for review of BP and labs.  Future Appointments  Date Time Provider Department Center  07/30/2022  6:45 AM [facility] SCHED ROOM [facility] None  07/31/2022  8:55 AM [facility], Wilmer Floor, CNM [facility] None    Scheryl Darter, MD

## 2022-07-24 NOTE — Progress Notes (Signed)
Pt has questions/concerns about delivery.

## 2022-07-25 ENCOUNTER — Telehealth (HOSPITAL_COMMUNITY): Payer: Self-pay | Admitting: *Deleted

## 2022-07-25 ENCOUNTER — Encounter (HOSPITAL_COMMUNITY): Payer: Self-pay | Admitting: *Deleted

## 2022-07-25 LAB — COMPREHENSIVE METABOLIC PANEL
ALT: 14 IU/L (ref 0–32)
AST: 27 IU/L (ref 0–40)
Albumin/Globulin Ratio: 1.9 (ref 1.2–2.2)
Albumin: 3.7 g/dL — ABNORMAL LOW (ref 3.9–4.9)
Alkaline Phosphatase: 200 IU/L — ABNORMAL HIGH (ref 44–121)
BUN/Creatinine Ratio: 15 (ref 9–23)
BUN: 10 mg/dL (ref 6–20)
Bilirubin Total: <0.2 mg/dL (ref 0.0–1.2)
CO2: 19 mmol/L — ABNORMAL LOW (ref 20–29)
Calcium: 9.1 mg/dL (ref 8.7–10.2)
Chloride: 107 mmol/L — ABNORMAL HIGH (ref 96–106)
Creatinine, Ser: 0.65 mg/dL (ref 0.57–1.00)
Globulin, Total: 1.9 g/dL (ref 1.5–4.5)
Glucose: 76 mg/dL (ref 70–99)
Potassium: 4.4 mmol/L (ref 3.5–5.2)
Sodium: 141 mmol/L (ref 134–144)
Total Protein: 5.6 g/dL — ABNORMAL LOW (ref 6.0–8.5)
eGFR: 118 mL/min/{1.73_m2} (ref >59)

## 2022-07-25 LAB — CBC
Hematocrit: 40.2 % (ref 34.0–46.6)
Hemoglobin: 13.7 g/dL (ref 11.1–15.9)
MCH: 36.1 pg — ABNORMAL HIGH (ref 26.6–33.0)
MCHC: 34.1 g/dL (ref 31.5–35.7)
MCV: 106 fL — ABNORMAL HIGH (ref 79–97)
Platelets: 184 10*3/uL (ref 150–450)
RBC: 3.8 x10E6/uL (ref 3.77–5.28)
RDW: 13.4 % (ref 11.7–15.4)
WBC: 12.8 10*3/uL — ABNORMAL HIGH (ref 3.4–10.8)

## 2022-07-25 LAB — PROTEIN / CREATININE RATIO, URINE
Creatinine, Urine: 196.3 mg/dL
Protein, Ur: 83.8 mg/dL
Protein/Creat Ratio: 427 mg/g creat — ABNORMAL HIGH (ref 0–200)

## 2022-07-25 NOTE — Telephone Encounter (Signed)
Preadmission screen  

## 2022-07-26 ENCOUNTER — Telehealth (INDEPENDENT_AMBULATORY_CARE_PROVIDER_SITE_OTHER): Payer: Medicaid Other | Admitting: Family Medicine

## 2022-07-26 ENCOUNTER — Inpatient Hospital Stay (HOSPITAL_COMMUNITY)
Admission: AD | Admit: 2022-07-26 | Discharge: 2022-07-26 | Disposition: A | Discharge status: Home / Self Care | Payer: Medicaid Other | Attending: Obstetrics & Gynecology | Admitting: Obstetrics & Gynecology

## 2022-07-26 DIAGNOSIS — O09523 Supervision of elderly multigravida, third trimester: Secondary | ICD-10-CM | POA: Diagnosis not present

## 2022-07-26 DIAGNOSIS — O09293 Supervision of pregnancy with other poor reproductive or obstetric history, third trimester: Secondary | ICD-10-CM | POA: Diagnosis present

## 2022-07-26 DIAGNOSIS — O1403 Mild to moderate pre-eclampsia, third trimester: Secondary | ICD-10-CM | POA: Insufficient documentation

## 2022-07-26 DIAGNOSIS — Z3A37 37 weeks gestation of pregnancy: Secondary | ICD-10-CM | POA: Insufficient documentation

## 2022-07-26 DIAGNOSIS — Z3480 Encounter for supervision of other normal pregnancy, unspecified trimester: Secondary | ICD-10-CM

## 2022-07-26 DIAGNOSIS — O1490 Unspecified pre-eclampsia, unspecified trimester: Secondary | ICD-10-CM

## 2022-07-26 DIAGNOSIS — O133 Gestational [pregnancy-induced] hypertension without significant proteinuria, third trimester: Secondary | ICD-10-CM | POA: Insufficient documentation

## 2022-07-26 DIAGNOSIS — O1493 Unspecified pre-eclampsia, third trimester: Secondary | ICD-10-CM

## 2022-07-26 DIAGNOSIS — Z3A36 36 weeks gestation of pregnancy: Secondary | ICD-10-CM

## 2022-07-26 LAB — CBC WITH DIFFERENTIAL/PLATELET
Abs Immature Granulocytes: 0.1 10*3/uL — ABNORMAL HIGH (ref 0.00–0.07)
Basophils Absolute: 0 10*3/uL (ref 0.0–0.1)
Basophils Relative: 0 %
Eosinophils Absolute: 0.1 10*3/uL (ref 0.0–0.5)
Eosinophils Relative: 1 %
HCT: 36.6 % (ref 36.0–46.0)
Hemoglobin: 12.7 g/dL (ref 12.0–15.0)
Immature Granulocytes: 1 %
Lymphocytes Relative: 23 %
Lymphs Abs: 2.4 10*3/uL (ref 0.7–4.0)
MCH: 35.9 pg — ABNORMAL HIGH (ref 26.0–34.0)
MCHC: 34.7 g/dL (ref 30.0–36.0)
MCV: 103.4 fL — ABNORMAL HIGH (ref 80.0–100.0)
Monocytes Absolute: 0.7 10*3/uL (ref 0.1–1.0)
Monocytes Relative: 6 %
Neutro Abs: 7.3 10*3/uL (ref 1.7–7.7)
Neutrophils Relative %: 69 %
Platelets: 174 10*3/uL (ref 150–400)
RBC: 3.54 MIL/uL — ABNORMAL LOW (ref 3.87–5.11)
RDW: 13.9 % (ref 11.5–15.5)
WBC: 10.7 10*3/uL — ABNORMAL HIGH (ref 4.0–10.5)
nRBC: 0 % (ref 0.0–0.2)

## 2022-07-26 LAB — COMPREHENSIVE METABOLIC PANEL
ALT: 20 U/L (ref 0–44)
AST: 31 U/L (ref 15–41)
Albumin: 2.7 g/dL — ABNORMAL LOW (ref 3.5–5.0)
Alkaline Phosphatase: 161 U/L — ABNORMAL HIGH (ref 38–126)
Anion gap: 7 (ref 5–15)
BUN: 8 mg/dL (ref 6–20)
CO2: 22 mmol/L (ref 22–32)
Calcium: 8.4 mg/dL — ABNORMAL LOW (ref 8.9–10.3)
Chloride: 108 mmol/L (ref 98–111)
Creatinine, Ser: 0.74 mg/dL (ref 0.44–1.00)
GFR, Estimated: >60 mL/min (ref >60)
Glucose, Bld: 84 mg/dL (ref 70–99)
Potassium: 3.5 mmol/L (ref 3.5–5.1)
Sodium: 137 mmol/L (ref 135–145)
Total Bilirubin: 0.4 mg/dL (ref 0.3–1.2)
Total Protein: 5.3 g/dL — ABNORMAL LOW (ref 6.5–8.1)

## 2022-07-26 LAB — PROTEIN / CREATININE RATIO, URINE
Creatinine, Urine: 141 mg/dL
Protein Creatinine Ratio: 0.17 mg/mg{Cre} — ABNORMAL HIGH (ref 0.00–0.15)
Total Protein, Urine: 24 mg/dL

## 2022-07-26 MED ORDER — HYDRALAZINE HCL 20 MG/ML IJ SOLN: 5.0000 mg | INTRAMUSCULAR | Status: DC | PRN

## 2022-07-26 MED ORDER — HYDRALAZINE HCL 20 MG/ML IJ SOLN: 10.0000 mg | INTRAMUSCULAR | Status: DC | PRN

## 2022-07-26 MED ORDER — LABETALOL HCL 5 MG/ML IV SOLN: 40.0000 mg | INTRAVENOUS | Status: DC | PRN

## 2022-07-26 MED ORDER — LABETALOL HCL 5 MG/ML IV SOLN: 20.0000 mg | INTRAVENOUS | Status: DC | PRN

## 2022-07-26 NOTE — MAU Provider Note (Signed)
  History     CSN: [id number]  Arrival date and time: 07/26/22 1114   Event Date/Time   First Provider Initiated Contact with Patient 07/26/22 1401      Chief Complaint  Patient presents with  . Hypertension   Katherine Barber , a  36 y.o. G3P1011 at 103w3d presents to MAU after being sent over from the office for a BP check in the presence of previously         {GYN/OB [id number]}  Past Medical History:  Diagnosis Date  . Anxiety   . Biceps tendonitis, right 11/22/2019   Formatting of this note might be different from the original. Added automatically from request for surgery [id number]  . Chronic right shoulder pain 07/27/2019  . History of gestational hypertension   . IBS (irritable bowel syndrome) 09/03/2019  . Migraine 01/15/2022  . Neck pain 01/15/2022  . 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 Pregnancy induced hypertension   . UTI (urinary tract infection)     Past Surgical History:  Procedure Laterality Date  . KNEE ARTHROSCOPY Bilateral    2 on left, 1 on right  . NOSE SURGERY    . PALATAL EXPANSION    . SHOULDER SURGERY Right 12/01/2019  . WISDOM TOOTH EXTRACTION    . WRIST SURGERY      Family History  Problem Relation Age of Onset  . Cancer Paternal Grandfather   . Congestive Heart Failure Father     Social History   Tobacco Use  . Smoking status: Every Day    Packs/day: .5    Types: Cigarettes  . Smokeless tobacco: Never  Vaping Use  . Vaping Use: Never used  Substance Use Topics  . Alcohol use: Not Currently    Comment: socially- not since pregnancy  . Drug use: No    Allergies: No Known Allergies  Medications Prior to Admission  Medication Sig Dispense Refill Last Dose  . acetaminophen-caffeine (EXCEDRIN TENSION HEADACHE) 500-65 MG TABS per tablet Take 2 tablets by mouth every 6 (six) hours as needed. 30 tablet 0   . ASPIRIN LOW DOSE 81 MG chewable tablet CHEW & SWALLOW 1 TABLET BY MOUTH ONCE A DAY 30 tablet 5   .  loratadine (CLARITIN REDITABS) 10 MG dissolvable tablet Take 10 mg by mouth daily.     . Prenatal 28-0.8 MG TABS Take 1 tablet by mouth daily. 30 tablet 12   . sertraline (ZOLOFT) 25 MG tablet TAKE 1 TABLET BY MOUTH DAILY 30 tablet 5     Review of Systems Physical Exam   Blood pressure (!) 137/92, pulse 89, temperature 98 F (36.7 C), resp. rate 18, height 5\' 3"  (1.6 m), weight 77.6 kg, last menstrual period 11/13/2021.  Physical Exam  MAU Course  Procedures  MDM ***  Assessment and Plan  ***  Claudette Head 07/26/2022, 2:01 PM

## 2022-07-26 NOTE — Progress Notes (Signed)
OBSTETRICS PRENATAL VIRTUAL VISIT ENCOUNTER NOTE  Provider location: [facility]   Patient location: Home  I connected with Katherine Barber on 07/26/22 at  8:55 AM EDT by MyChart Video Encounter and verified that I am speaking with the correct person using two identifiers. I discussed the limitations, risks, security and privacy concerns of performing an evaluation and management service virtually and the availability of in person appointments. I also discussed with the patient that there may be a patient responsible charge related to this service. The patient expressed understanding and agreed to proceed. Subjective:  Katherine Barber is a 37 y.o. G3P1011 at [redacted]w[redacted]d being seen today for ongoing prenatal care.  She is currently monitored for the following issues for this high-risk pregnancy and has Anxiety; Internal derangement of shoulder, right; Moderate major depression (HCC); Tobacco use disorder; Supervision of other normal pregnancy, antepartum; and Gestational hypertension on their problem list.  Patient reports  no visual changes headache at this time, right upper quadrant pain.  Is complaining of lower extremity swelling .  Contractions: Not present. Vag. Bleeding: None.  Movement: Present. Denies any leaking of fluid.   The following portions of the patient's history were reviewed and updated as appropriate: allergies, current medications, past family history, past medical history, past social history, past surgical history and problem list.   Objective:  There were no vitals filed for this visit.  At home blood pressure: Initial 180s/80s, repeat 20 minutes later is the same  Fetal Status:     Movement: Present     General:  Alert, oriented and cooperative. Patient is in no acute distress.  Respiratory: Normal respiratory effort, no problems with respiration noted  Mental Status: Normal mood and affect. Normal behavior. Normal judgment and thought content.   Rest of physical exam deferred due to type of encounter  Imaging: [place] MFM FETAL BPP WO NON STRESS  Result Date: 07/23/2022 ----------------------------------------------------------------------  OBSTETRICS REPORT                       (Signed Final 07/23/2022 04:55 pm) ---------------------------------------------------------------------- Patient Info  ID #:       [id number]                          D.O.B.:  January 28, 1986 (37 yrs)  Name:       Katherine Barber                  Visit Date: 07/23/2022 03:54 pm ---------------------------------------------------------------------- Performed By  Attending:        Noralee Space MD        Ref. Address:     [address]                                                             [phone]  [place]                                                             [address]  Performed By:     Tommie Raymond BS,       [facility]                                [facility]  Referred By:      [facility] ---------------------------------------------------------------------- Orders  #  Description                           Code        Ordered By  1  [place] MFM FETAL BPP WO NON               76819.01    BURK SCHAIBLE     STRESS  2  [place] MFM OB FOLLOW UP                   76816.01    [facility] ----------------------------------------------------------------------  #  Order #                     Accession #                Episode #  1  [id number]                   [id number]                 [id number]  2  [id number]                   [id number]                 [id number] ---------------------------------------------------------------------- Indications  [redacted] weeks gestation of  pregnancy                Z3A.36  Hypertension - Gestational                     O32.9  Advanced maternal age primigravida 6+,        O61.513  third trimester  Tobacco use complicating pregnancy, third      O99.333  trimester  Poor obstetric history: Previous preeclampsia  O09.299  LR NIPS - Female, Negative Horizon, Negative  AFP  Encounter for other antenatal screening        Z36.2  follow-up ---------------------------------------------------------------------- Fetal Evaluation  Num Of Fetuses:         1  Fetal Heart Rate(bpm):  135  Cardiac Activity:       Observed  Presentation:           Cephalic  Placenta:               Anterior  P. Cord Insertion:      Previously visualized  Amniotic Fluid  AFI FV:      Within normal limits  AFI Sum(cm)     %Tile       Largest Pocket(cm)  14.28           52          4.66  RUQ(cm)       RLQ(cm)       LUQ(cm)        LLQ(cm)  4.15          4.66          1.37           4.11 ---------------------------------------------------------------------- Biophysical Evaluation  Amniotic F.V:   Pocket => 2 cm             F. Tone:        Observed  F. Movement:    Observed                   Score:          8/8  F. Breathing:   Observed ---------------------------------------------------------------------- Biometry  BPD:      82.3  mm     G. Age:  33w 1d        2.2  %    CI:        73.46   %    70 - 86                                                          FL/HC:      20.3   %    20.1 - 22.1  HC:      305.1  mm     G. Age:  34w 0d        1.1  %    HC/AC:      0.93        0.93 - 1.11  AC:      328.1  mm     G. Age:  36w 5d         79  %    FL/BPD:     75.2   %    71 - 87  FL:       61.9  mm     G. Age:  32w 1d        < 1  %    FL/AC:      18.9   %    20 - 24  Est. FW:    2534  gm      5 lb 9 oz     22  % ---------------------------------------------------------------------- OB History  Gravidity:    3  Term:   1        Prem:   0        SAB:   1  TOP:          0       Ectopic:  0         Living: 10 ---------------------------------------------------------------------- Gestational Age  LMP:           36w 0d        Date:  11/13/21                  EDD:   08/20/22  U/S Today:     34w 0d                                        EDD:   09/03/22  Best:          36w 0d     Det. By:  U/S C R L  (01/15/22)    EDD:   08/20/22 ---------------------------------------------------------------------- Anatomy  Cranium:               Appears normal         LVOT:                   Previously seen  Cavum:                 Appears normal         Aortic Arch:            Previously seen  Ventricles:            Appears normal         Ductal Arch:            Previously seen  Choroid Plexus:        Previously seen        Diaphragm:              Appears normal  Cerebellum:            Previously seen        Stomach:                Appears normal, left                                                                        sided  Posterior Fossa:       Previously seen        Abdomen:                Previously seen  Nuchal Fold:           Previously seen        Abdominal Wall:         Previously seen  Face:                  Orbits and profile     Cord Vessels:           Previously seen  previously seen  Lips:                  Previously seen        Kidneys:                Appear normal  Palate:                Previously             Bladder:                Appears normal                         visualized  Thoracic:              Appears normal         Spine:                  Previously seen  Heart:                 Appears normal         Upper Extremities:      Previously seen                         (4CH, axis, and                         situs)  RVOT:                  Previously seen        Lower Extremities:      Previously seen  Other:  Female gender, Nasal bone, lenses, maxilla, mandible, falx, Heels/feet,          open hands/5th digits, VC, 3VV, and 3VTV previously visualized.          Technically difficult due  to fetal position. ---------------------------------------------------------------------- Cervix Uterus Adnexa  Cervix  Not visualized (advanced GA >24wks)  Uterus  No abnormality visualized.  Right Ovary  Within normal limits.  Left Ovary  Within normal limits.  Cul De Sac  No free fluid seen.  Adnexa  No adnexal mass visualized ---------------------------------------------------------------------- Impression  Gestational hypertension.  Patient is scheduled to have  induction of labor next week.  Her home blood pressures  were 173/101 and repeat 158/100 mmHg.  Today, at our  office her blood pressure is 155/99 mmHg.  She does not  have signs and symptoms of severe features of preeclampsia  including severe headache, visual disturbances or right upper  quadrant pain.  Patient reports good fetal movements.  Fetal growth is appropriate for gestational age.  Amniotic fluid  is normal good fetal activity seen.  Antenatal testing is  reassuring.  BPP 8/8.  I counseled the patient on the diagnosis of gestational  hypertension and parameters for severe hypertension.  I  advised her to come to the hospital if she has severe range  blood pressures at home (systolic blood pressure 160 or  greater and/or diastolic blood pressure of 110 or greater) or if  she has symptoms of severe headache or visual  disturbances or right upper quadrant pain or vaginal bleeding  or decreased fetal movements.  Patient has a prenatal visit appointment tomorrow.  Patient was concerned about induction of labor leading to  emergency cesarean section because of nonreassuring fetal  heart rates.  She reports she has always had  nonreactive  NST at your office.  Oxytocin challenge may be tried first before resorting to  prostaglandin induction. ---------------------------------------------------------------------- Recommendations  -Delivery at 37 weeks. ----------------------------------------------------------------------                 Noralee Space,  MD Electronically Signed Final Report   07/23/2022 04:55 pm ----------------------------------------------------------------------  [place] MFM OB FOLLOW UP  Result Date: 07/23/2022 ----------------------------------------------------------------------  OBSTETRICS REPORT                       (Signed Final 07/23/2022 04:55 pm) ---------------------------------------------------------------------- Patient Info  ID #:       [id number]                          D.O.B.:  20-Sep-1985 (37 yrs)  Name:       Katherine Barber                  Visit Date: 07/23/2022 03:54 pm ---------------------------------------------------------------------- Performed By  Attending:        Noralee Space MD        Ref. Address:     [address]                                                             [address]                                                             [place]                                                             [address]  Performed By:     Tommie Raymond BS,       [facility]                    RD[facility], RVT                                [facility]  Referred By:      [name] ---------------------------------------------------------------------- Orders  #  Description                           Code        Ordered By  1  [place] MFM FETAL BPP WO NON               76819.01    BURK SCHAIBLE     STRESS  2  [place] MFM OB FOLLOW UP                   76816.01    [facility] ----------------------------------------------------------------------  #  Order #                     Accession #                Episode #  1  [id number]                   [id number]                 [id number]  2  [id number]                   [id number]                 [id number]  ---------------------------------------------------------------------- Indications  [redacted] weeks gestation of pregnancy                Z3A.36  Hypertension - Gestational                     O3.9  Advanced maternal age primigravida 74+,        O85.513  third trimester  Tobacco use complicating pregnancy, third      O99.333  trimester  Poor obstetric history: Previous preeclampsia  O09.299  LR NIPS - Female, Negative Horizon, Negative  AFP  Encounter for other antenatal screening        Z36.2  follow-up ---------------------------------------------------------------------- Fetal Evaluation  Num Of Fetuses:         1  Fetal Heart Rate(bpm):  135  Cardiac Activity:       Observed  Presentation:           Cephalic  Placenta:               Anterior  P. Cord Insertion:      Previously visualized  Amniotic Fluid  AFI FV:      Within normal limits  AFI Sum(cm)     %Tile       Largest Pocket(cm)  14.28           52          4.66  RUQ(cm)       RLQ(cm)       LUQ(cm)        LLQ(cm)  4.15          4.66          1.37           4.11 ---------------------------------------------------------------------- Biophysical Evaluation  Amniotic F.V:   Pocket => 2 cm             F. Tone:        Observed  F. Movement:    Observed  Score:          8/8  F. Breathing:   Observed ---------------------------------------------------------------------- Biometry  BPD:      82.3  mm     G. Age:  33w 1d        2.2  %    CI:        73.46   %    70 - 86                                                          FL/HC:      20.3   %    20.1 - 22.1  HC:      305.1  mm     G. Age:  34w 0d        1.1  %    HC/AC:      0.93        0.93 - 1.11  AC:      328.1  mm     G. Age:  36w 5d         79  %    FL/BPD:     75.2   %    71 - 87  FL:       61.9  mm     G. Age:  32w 1d        < 1  %    FL/AC:      18.9   %    20 - 24  Est. FW:    2534  gm      5 lb 9 oz     22  % ---------------------------------------------------------------------- OB History   Gravidity:    3         Term:   1        Prem:   0        SAB:   1  TOP:          0       Ectopic:  0        Living: 10 ---------------------------------------------------------------------- Gestational Age  LMP:           36w 0d        Date:  11/13/21                  EDD:   08/20/22  U/S Today:     34w 0d                                        EDD:   09/03/22  Best:          36w 0d     Det. By:  U/S C R L  (01/15/22)    EDD:   08/20/22 ---------------------------------------------------------------------- Anatomy  Cranium:               Appears normal         LVOT:                   Previously seen  Cavum:                 Appears normal         Aortic Arch:  Previously seen  Ventricles:            Appears normal         Ductal Arch:            Previously seen  Choroid Plexus:        Previously seen        Diaphragm:              Appears normal  Cerebellum:            Previously seen        Stomach:                Appears normal, left                                                                        sided  Posterior Fossa:       Previously seen        Abdomen:                Previously seen  Nuchal Fold:           Previously seen        Abdominal Wall:         Previously seen  Face:                  Orbits and profile     Cord Vessels:           Previously seen                         previously seen  Lips:                  Previously seen        Kidneys:                Appear normal  Palate:                Previously             Bladder:                Appears normal                         visualized  Thoracic:              Appears normal         Spine:                  Previously seen  Heart:                 Appears normal         Upper Extremities:      Previously seen                         (4CH, axis, and                         situs)  RVOT:                  Previously seen  Lower Extremities:      Previously seen  Other:  Female gender, Nasal bone, lenses, maxilla, mandible, falx, Heels/feet,           open hands/5th digits, VC, 3VV, and 3VTV previously visualized.          Technically difficult due to fetal position. ---------------------------------------------------------------------- Cervix Uterus Adnexa  Cervix  Not visualized (advanced GA >24wks)  Uterus  No abnormality visualized.  Right Ovary  Within normal limits.  Left Ovary  Within normal limits.  Cul De Sac  No free fluid seen.  Adnexa  No adnexal mass visualized ---------------------------------------------------------------------- Impression  Gestational hypertension.  Patient is scheduled to have  induction of labor next week.  Her home blood pressures  were 173/101 and repeat 158/100 mmHg.  Today, at our  office her blood pressure is 155/99 mmHg.  She does not  have signs and symptoms of severe features of preeclampsia  including severe headache, visual disturbances or right upper  quadrant pain.  Patient reports good fetal movements.  Fetal growth is appropriate for gestational age.  Amniotic fluid  is normal good fetal activity seen.  Antenatal testing is  reassuring.  BPP 8/8.  I counseled the patient on the diagnosis of gestational  hypertension and parameters for severe hypertension.  I  advised her to come to the hospital if she has severe range  blood pressures at home (systolic blood pressure 160 or  greater and/or diastolic blood pressure of 110 or greater) or if  she has symptoms of severe headache or visual  disturbances or right upper quadrant pain or vaginal bleeding  or decreased fetal movements.  Patient has a prenatal visit appointment tomorrow.  Patient was concerned about induction of labor leading to  emergency cesarean section because of nonreassuring fetal  heart rates.  She reports she has always had nonreactive  NST at your office.  Oxytocin challenge may be tried first before resorting to  prostaglandin induction. ---------------------------------------------------------------------- Recommendations  -Delivery at 37  weeks. ----------------------------------------------------------------------                 Noralee Space, MD Electronically Signed Final Report   07/23/2022 04:55 pm ----------------------------------------------------------------------  [place] MFM FETAL BPP WO NON STRESS  Result Date: 07/10/2022 ----------------------------------------------------------------------  OBSTETRICS REPORT                       (Signed Final 07/10/2022 01:36 pm) ---------------------------------------------------------------------- Patient Info  ID #:       [id number]                          D.O.B.:  02/18/1986 (37 yrs)  Name:       Katherine Barber                  Visit Date: 07/10/2022 01:24 pm ---------------------------------------------------------------------- Performed By  Attending:        Braxton Feathers DO       Ref. Address:     [address]                                                             [place], [place]  [address]  Performed By:     Marcellina Millin          Secondary Phy.:   [facility] MAU/Triage                    RDMS  Referred By:      Juleen China              [facility] ---------------------------------------------------------------------- Orders  #  Description                           Code        Ordered By  1  [place] MFM FETAL BPP WO NON               76819.01    [facility]     STRESS ----------------------------------------------------------------------  #  Order #                     Accession #                Episode #  1  [id number]                   [id number]                 [id number] ---------------------------------------------------------------------- Indications  Hypertension - Gestational                     O13.9  [redacted] weeks gestation of pregnancy                Z3A.55  Advanced maternal age multigravida 50+,        O28.522  second trimester (35 by EDD)  ---------------------------------------------------------------------- Fetal Evaluation  Num Of Fetuses:         1  Fetal Heart Rate(bpm):  140  Cardiac Activity:       Observed  Presentation:           Cephalic  Placenta:               Anterior  Amniotic Fluid  AFI FV:      Within normal limits  AFI Sum(cm)     %Tile       Largest Pocket(cm)  10.2            21          3.6  RUQ(cm)       RLQ(cm)       LUQ(cm)        LLQ(cm)  2.2           1.5           3.6            2.9 ---------------------------------------------------------------------- Biophysical Evaluation  Amniotic F.V:   Within normal limits       F. Tone:        Observed  F. Movement:    Observed                   Score:  8/8  F. Breathing:   Observed ---------------------------------------------------------------------- OB History  Gravidity:    3         Term:   1        Prem:   0        SAB:   1  TOP:          0       Ectopic:  0        Living: 10 ---------------------------------------------------------------------- Gestational Age  LMP:           34w 1d        Date:  11/13/21                  EDD:   08/20/22  Best:          34w 1d     Det. By:  U/S C R L  (01/15/22)    EDD:   08/20/22 ---------------------------------------------------------------------- Anatomy  Cranium:               Appears normal         Stomach:                Appears normal, left                                                                        sided  Ventricles:            Appears normal         Kidneys:                Appear normal  Heart:                 Appears normal         Bladder:                Appears normal                         (4CH, axis, and                         situs)  Diaphragm:             Appears normal ---------------------------------------------------------------------- Comments  Hospital Ultrasound  34w 1d at the MAU for nonreactive NST. EDD: 08/20/2022 by  U/S C R L  (01/15/22).  Sonographic findings  Single intrauterine pregnancy.  Fetal  cardiac activity: Observed and appears normal.  Presentation: Cephalic.  Limited fetal anatomy appears normal.  Amniotic fluid volume: Within normal limits. AFI: 10.2 cm.  MVP: 3.6 cm.  Placenta: Anterior.  BPP: 8/8.  Recommendations  - Continue clinical management per OB provider  This was a limited ultrasound with a remote read. If an official  MFM consult is requested for any reason please call/place an  order in Epic. ----------------------------------------------------------------------                 Braxton Feathers, DO Electronically Signed Final Report   07/10/2022 01:36 pm ----------------------------------------------------------------------  [place] MFM OB FOLLOW UP  Result Date: 06/28/2022 ----------------------------------------------------------------------  OBSTETRICS REPORT                       (  Signed Final 06/28/2022 01:40 pm) ---------------------------------------------------------------------- Patient Info  ID #:       [id number]                          D.O.B.:  1985-06-21 (37 yrs)  Name:       Katherine Barber                  Visit Date: 06/28/2022 10:46 am ---------------------------------------------------------------------- Performed By  Attending:        Braxton Feathers DO       Ref. Address:     [address]                                                             [address]                                                             [place]                                                             [address]  Performed By:     Reinaldo Raddle            [facility]                                     [facility]  Referred By:      [name] ---------------------------------------------------------------------- Orders  #  Description  Code        Ordered By  1  [place] MFM OB FOLLOW UP                   [id number]    Rosana Hoes ----------------------------------------------------------------------  #  Order #                     Accession #                Episode #  1  [id number]                   [id number]                 [id number] ---------------------------------------------------------------------- Indications  Advanced maternal age multigravida 11+,        O68.522  second trimester (37 by EDD)  Tobacco use complicating pregnancy,            O99.332  second trimester  Poor obstetric history: Previous preeclampsia  O09.299  LR NIPS - Female, Negative Horizon, Negative  AFP  [redacted] weeks gestation of pregnancy                Z3A.32 ---------------------------------------------------------------------- Vital Signs  BP:          130/89 ---------------------------------------------------------------------- Fetal Evaluation  Num Of Fetuses:         1  Fetal Heart Rate(bpm):  140  Cardiac Activity:       Observed  Presentation:           Cephalic  Placenta:               Anterior  P. Cord Insertion:      Previously visualized  Amniotic Fluid  AFI FV:      Within normal limits  AFI Sum(cm)     %Tile       Largest Pocket(cm)  10.49           20          5.52  RUQ(cm)       RLQ(cm)       LUQ(cm)        LLQ(cm)  5.52          1.9           0              3.07 ---------------------------------------------------------------------- Biometry  BPD:      77.2  mm     G. Age:  31w 0d          8  %    CI:        72.58   %    70 - 86                                                          FL/HC:      19.4   %    19.1 - 21.3  HC:      288.2  mm     G. Age:  31w 5d          5  %    HC/AC:      0.98        0.96 - 1.17  AC:      293.6  mm  G. Age:  33w 2d         76  %    FL/BPD:     72.4   %    71 - 87  FL:       55.9  mm     G. Age:  29w 3d        < 1  %    FL/AC:      19.0   %    20 - 24  LV:        5.5  mm  Est. FW:    1846  gm      4 lb 1 oz     23  %  ---------------------------------------------------------------------- OB History  Gravidity:    3         Term:   1        Prem:   0        SAB:   1  TOP:          0       Ectopic:  0        Living: 10 ---------------------------------------------------------------------- Gestational Age  LMP:           32w 3d        Date:  11/13/21                  EDD:   08/20/22  U/S Today:     31w 3d                                        EDD:   08/27/22  Best:          32w 3d     Det. By:  U/S C R L  (01/15/22)    EDD:   08/20/22 ---------------------------------------------------------------------- Anatomy  Cranium:               Appears normal         LVOT:                   Appears normal  Cavum:                 Appears normal         Aortic Arch:            Previously seen  Ventricles:            Appears normal         Ductal Arch:            Previously seen  Choroid Plexus:        Previously seen        Diaphragm:              Appears normal  Cerebellum:            Previously seen        Stomach:                Appears normal, left  sided  Posterior Fossa:       Previously seen        Abdomen:                Previously seen  Nuchal Fold:           Previously seen        Abdominal Wall:         Previously seen  Face:                  Orbits and profile     Cord Vessels:           Previously seen                         previously seen  Lips:                  Previously seen        Kidneys:                Appear normal  Palate:                Previously             Bladder:                Appears normal                         visualized  Thoracic:              Previously seen        Spine:                  Previously seen  Heart:                 Appears normal         Upper Extremities:      Previously seen                         (4CH, axis, and                         situs)  RVOT:                  Previously seen        Lower Extremities:      Previously  seen  Other:  Fetus appears to be a female. Nasal bone, lenses, maxilla, mandible          and falx, Heels/feet and open hands/5th digits, VC, 3VV and 3VTV          previously visualized. Technically difficult due to fetal position. ---------------------------------------------------------------------- Cervix Uterus Adnexa  Cervix  Not visualized (advanced GA >24wks)  Uterus  No abnormality visualized.  Right Ovary  Not visualized.  Left Ovary  Not visualized.  Cul De Sac  No free fluid seen.  Adnexa  No adnexal mass visualized ---------------------------------------------------------------------- Comments  The patient is here for a follow-up ultrasound at 32w 3d for  tobacco use. She is down to only 5 cigarettes per day. EDD:  08/20/2022 dated by U/S C R L  (01/15/22). She has no  concerns today.  Sonographic findings  Single intrauterine pregnancy.  Fetal cardiac activity:  Observed and appears normal.  Presentation: Cephalic.  Interval fetal anatomy appears normal  Fetal biometry shows the estimated fetal  weight at the 23  percentile.  Amniotic fluid volume: Within normal limits. MVP: 5.52 cm.  Placenta: Anterior.  Recommendations  1. Serial growth ultrasounds every 4 weeks until delivery  2. Antenatal testing to start around 36 weeks due to tobacco  use  3. Delivery around [redacted] weeks gestation ----------------------------------------------------------------------                  Braxton Feathers, DO Electronically Signed Final Report   06/28/2022 01:40 pm ----------------------------------------------------------------------   Assessment and Plan:  Pregnancy: G3P1011 at [redacted]w[redacted]d 1. Supervision of other normal pregnancy, antepartum Patient scheduled for induction of labor 5/28.  Due to severe range blood pressures recommended to go MAU to be evaluated  2. Pre-eclampsia, antepartum Patient will go to the MAU to be evaluated for severe Pree E and may need admission.  Preterm labor symptoms and general obstetric  precautions including but not limited to vaginal bleeding, contractions, leaking of fluid and fetal movement were reviewed in detail with the patient. I discussed the assessment and treatment plan with the patient. The patient was provided an opportunity to ask questions and all were answered. The patient agreed with the plan and demonstrated an understanding of the instructions. The patient was advised to call back or seek an in-person office evaluation/go to MAU at [facility] for any urgent or concerning symptoms. Please refer to After Visit Summary for other counseling recommendations.   I attempted to do virtual visit.  Was able to see and hear the patient but was unable to get my video or audio to function properly.  Ended up having to call the patient.  Contacted attending at the hospital to discuss direct admission versus evaluation in the MAU.  She recommended evaluation in the MAU first.  No follow-ups on file.  Future Appointments  Date Time Provider Department Center  07/30/2022  6:45 AM [facility] SCHED ROOM [facility] None  07/31/2022  8:55 AM Left[place]-Kirby, Wilmer Floor, CNM [facility] None    Celedonio Savage, MD Center for [facility], [facility]

## 2022-07-26 NOTE — MAU Note (Signed)
.  Katherine Barber is a 37 y.o. at [redacted]w[redacted]d here in MAU reporting: had video visit this morning and b/p was 180's. Ob had her come in to get evaluated. Induction on Tuesday. Denies any pain or discharge. Good fetal movement felt, LMP:  Onset of complaint:  Pain score: 0     Vitals:   07/26/22 1144  BP: (!) 147/102  Pulse: (!) 104  Resp: 18  Temp: 98 F (36.7 C)     FHT:135 Lab orders placed from triage:

## 2022-07-30 ENCOUNTER — Inpatient Hospital Stay (HOSPITAL_COMMUNITY): Admission: RE | Admit: 2022-07-30 | Payer: Medicaid Other | Source: Home / Self Care | Admitting: Family Medicine

## 2022-07-30 ENCOUNTER — Inpatient Hospital Stay (HOSPITAL_COMMUNITY): Payer: Medicaid Other

## 2022-07-31 ENCOUNTER — Telehealth: Payer: Medicaid Other | Admitting: Advanced Practice Midwife

## 2022-07-31 ENCOUNTER — Telehealth (HOSPITAL_BASED_OUTPATIENT_CLINIC_OR_DEPARTMENT_OTHER): Payer: Self-pay | Admitting: Advanced Practice Midwife

## 2022-07-31 ENCOUNTER — Encounter: Payer: Self-pay | Admitting: Advanced Practice Midwife

## 2022-07-31 DIAGNOSIS — O1413 Severe pre-eclampsia, third trimester: Secondary | ICD-10-CM

## 2022-07-31 DIAGNOSIS — O1415 Severe pre-eclampsia, complicating the puerperium: Secondary | ICD-10-CM | POA: Diagnosis not present

## 2022-07-31 DIAGNOSIS — O1414 Severe pre-eclampsia complicating childbirth: Secondary | ICD-10-CM

## 2022-07-31 MED ORDER — NIFEDIPINE ER OSMOTIC RELEASE 30 MG PO TB24: 30.0000 mg | ORAL_TABLET | Freq: Every day | ORAL | 1 refills | Status: DC

## 2022-07-31 NOTE — Telephone Encounter (Signed)
I called patient today after her appt was missed.  Pt delivered on 5/26 at [facility] facility in [place].  Completed a PP BP check virtually today, see virtual note for more details.

## 2022-07-31 NOTE — Progress Notes (Signed)
   GYNECOLOGY PROGRESS NOTE  History:  37 y.o. G3P1011 presents to [name] office today for virtual postpartum visit/blood pressure check.  She was seen in the office at 36 weeks on 5/22 with severe range BP x 1, retake elevated but not severe range.  She followed up with BP check on 5/24 which was elevated so was sent to MAU.  On 07/26/22 in MAU, BPs were mild range with normal PEC labs. She had severe range BPs at home, however, so went to [facility] and had severe range BPs there. She was admitted for IOL and delivered vaginally on 07/28/22.  She was not discharged on any medications with normal BPs (135/80s per pt). She plans to follow up for routine PP visits with [name].     Health Maintenance Due  Topic Date Due   COVID-19 Vaccine (1) Never done     Review of Systems:  Pertinent items are noted in HPI.   Objective:  Physical Exam Pt took home BP after today's visit and reported BP 145/ No physical exam due to the nature of this visit  Assessment & Plan:  1. Severe pre-eclampsia, with delivery --BP check today on home cuff elevated 145/94. No s/sx of PEC. --Start medications to prevent readmission. Pt is 3 days PP and reports significant improvement in edema so Lasix not prescribed at this time. --BP check in 4-5 days --Reviewed s/sx of PEC, reasons to seek care  - NIFEdipine (PROCARDIA-XL/NIFEDICAL-XL) 30 MG 24 hr tablet; Take 1 tablet (30 mg total) by mouth daily.  Dispense: 30 tablet; Refill: 1    No follow-ups on file.   Sharen Counter, CNM 6:11 PM

## 2022-08-02 ENCOUNTER — Ambulatory Visit: Payer: Medicaid Other

## 2022-08-03 DIAGNOSIS — O1413 Severe pre-eclampsia, third trimester: Secondary | ICD-10-CM | POA: Insufficient documentation

## 2022-08-09 ENCOUNTER — Ambulatory Visit: Payer: Medicaid Other

## 2022-08-13 ENCOUNTER — Telehealth: Payer: Medicaid Other

## 2022-08-13 NOTE — Progress Notes (Signed)
Pt was scheduled for in person BP check visit today but called and stated that she had issues with her car. Appt was changed to virtual. Contacted pt for virtual visit and pt stated that she left her BP cuff at home and she did not know when she would have transportation. Advised pt to send BP reading in mychart today if possible or call back to reschedule appt.

## 2022-08-16 ENCOUNTER — Ambulatory Visit: Payer: Medicaid Other

## 2022-09-03 ENCOUNTER — Encounter: Payer: Self-pay | Admitting: Advanced Practice Midwife

## 2022-09-03 ENCOUNTER — Ambulatory Visit (INDEPENDENT_AMBULATORY_CARE_PROVIDER_SITE_OTHER): Payer: Medicaid Other | Admitting: Advanced Practice Midwife

## 2022-09-03 VITALS — BP 108/71 | HR 74 | Ht 63.0 in | Wt 142.8 lb

## 2022-09-03 DIAGNOSIS — O1414 Severe pre-eclampsia complicating childbirth: Secondary | ICD-10-CM

## 2022-09-03 DIAGNOSIS — Z30011 Encounter for initial prescription of contraceptive pills: Secondary | ICD-10-CM

## 2022-09-03 MED ORDER — DROSPIRENONE-ETHINYL ESTRADIOL 3-0.02 MG PO TABS
1.0000 | ORAL_TABLET | Freq: Every day | ORAL | 11 refills | Status: DC
Start: 2022-09-03 — End: 2023-09-11

## 2022-09-03 NOTE — Progress Notes (Signed)
Post Partum Visit Note  Katherine Barber is a 37 y.o. G68P1011 female who presents for a postpartum visit. She is 6 weeks postpartum following a normal spontaneous vaginal delivery.  I have fully reviewed the prenatal and intrapartum course. The delivery was at [redacted]w[redacted]d gestational weeks.  Anesthesia: epidural. Postpartum course has been uncomplicated. Baby is doing well. Baby is feeding by bottle - Gerber Soothe Pro . Bleeding no bleeding. Bowel function is normal. Bladder function is normal. Patient is not sexually active. Contraception method is none. Postpartum depression screening: negative.   The pregnancy intention screening data noted above was reviewed. Potential methods of contraception were discussed. The patient elected to proceed with OCPs.   Edinburgh Postnatal Depression Scale - 09/03/22 1055       Edinburgh Postnatal Depression Scale:  In the Past 7 Days   I have been able to laugh and see the funny side of things. 0    I have looked forward with enjoyment to things. 0    I have blamed myself unnecessarily when things went wrong. 0    I have been anxious or worried for no good reason. 0    I have felt scared or panicky for no good reason. 0    Things have been getting on top of me. 3    I have been so unhappy that I have had difficulty sleeping. 0    I have felt sad or miserable. 0    I have been so unhappy that I have been crying. 0    The thought of harming myself has occurred to me. 0    Edinburgh Postnatal Depression Scale Total 3             Health Maintenance Due  Topic Date Due   COVID-19 Vaccine (1) Never done    The following portions of the patient's history were reviewed and updated as appropriate: allergies, current medications, past family history, past medical history, past social history, past surgical history, and problem list.  Review of Systems Pertinent items noted in HPI and remainder of comprehensive ROS otherwise negative.  Objective:  BP  108/71   Pulse 74   Ht 5\' 3"  (1.6 m)   Wt 142 lb 12.8 oz (64.8 kg)   LMP 11/13/2021 (Exact Date)   Breastfeeding Unknown   BMI 25.30 kg/m    VS reviewed, nursing note reviewed,  Constitutional: well developed, well nourished, no distress HEENT: normocephalic CV: normal rate Pulm/chest wall: normal effort Abdomen: soft Neuro: alert and oriented x 3 Skin: warm, dry Psych: affect normal   Assessment:   1. Initiation of oral contraception --See contraceptive counseling below --Pt with breakthrough bleeding on  Lo Ortho tri cyclen, increased to ortho tricyclen prior to pregnancy. Discussed options with pt, including resuming previous OCP and trying low dose monophasic pill to see if there is breakthrough bleeding.   --Pt to try monophasic pill, low dose OCP, follow up with provider. No Ortho product with low estrogen except triphasic.  Rx for Yaz sent to pharmacy.  If pt has side effects, can prescribe Ortho cyclen again.  - drospirenone-ethinyl estradiol (YAZ) 3-0.02 MG tablet; Take 1 tablet by mouth daily.  Dispense: 28 tablet; Refill: 11  2. Postpartum care following vaginal delivery --Doing well, bonding well with baby, good support at home.  3. Severe pre-eclampsia, with delivery --Pt still taking Procardia daily. BPs grossly normal, pt taking at home. --Discontinue Procardia.   --Take BP daily x 4-5 days  after discontinuing medication, then weekly. --Notify office if elevated BP above 140/90 consistently --F/U with PCP    Plan:   Essential components of care per ACOG recommendations:  1.  Mood and well being: Patient with negative depression screening today. Reviewed local resources for support.  - Patient tobacco use? No.   - hx of drug use? No.    2. Infant care and feeding:  -Patient currently breastmilk feeding? No.  -Social determinants of health (SDOH) reviewed in EPIC. No concerns   3. Sexuality, contraception and birth spacing - Patient does not want a  pregnancy in the next year.   - Reviewed reproductive life planning. Reviewed contraceptive methods based on pt preferences and effectiveness.  Patient desired Oral Contraceptive today.   - Discussed birth spacing of 18 months  4. Sleep and fatigue -Encouraged family/partner/community support of 4 hrs of uninterrupted sleep to help with mood and fatigue  5. Physical Recovery  - Discussed patients delivery and complications. She describes her labor as good. - Patient had a Vaginal, no problems at delivery.  Perineal healing reviewed. Patient expressed understanding - Patient has urinary incontinence? No. - Patient is safe to resume physical and sexual activity  6.  Health Maintenance - HM due items addressed Yes - Last pap smear  Diagnosis  Date Value Ref Range Status  02/06/2022   Final   - Negative for intraepithelial lesion or malignancy (NILM)   Pap smear not done at today's visit.  -Breast Cancer screening indicated? No.   7. Chronic Disease/Pregnancy Condition follow up: Hypertension --D/C Procardia, check BP daily x 4-5 days, then weekly. Notify our office if elevated.   - PCP follow up  Sharen Counter, CNM Center for [employer], [facility]

## 2023-02-21 ENCOUNTER — Other Ambulatory Visit: Payer: Self-pay | Admitting: Obstetrics

## 2023-02-21 DIAGNOSIS — F419 Anxiety disorder, unspecified: Secondary | ICD-10-CM

## 2023-02-21 DIAGNOSIS — F32A Depression, unspecified: Secondary | ICD-10-CM

## 2023-04-21 ENCOUNTER — Other Ambulatory Visit: Payer: Self-pay

## 2023-04-21 DIAGNOSIS — F32A Depression, unspecified: Secondary | ICD-10-CM

## 2023-05-20 ENCOUNTER — Other Ambulatory Visit: Payer: Self-pay | Admitting: Family Medicine

## 2023-05-20 DIAGNOSIS — F419 Anxiety disorder, unspecified: Secondary | ICD-10-CM

## 2023-08-03 HISTORY — PX: SHOULDER SURGERY: SHX246

## 2023-09-11 ENCOUNTER — Other Ambulatory Visit: Payer: Self-pay

## 2023-09-11 ENCOUNTER — Telehealth: Payer: Self-pay | Admitting: Advanced Practice Midwife

## 2023-09-11 DIAGNOSIS — Z30011 Encounter for initial prescription of contraceptive pills: Secondary | ICD-10-CM

## 2023-09-11 MED ORDER — DROSPIRENONE-ETHINYL ESTRADIOL 3-0.02 MG PO TABS
1.0000 | ORAL_TABLET | Freq: Every day | ORAL | 0 refills | Status: DC
Start: 2023-09-11 — End: 2023-09-16

## 2023-09-11 NOTE — Telephone Encounter (Signed)
 Pt called for refill on birth control last seen on September 03, 2023

## 2023-09-16 ENCOUNTER — Other Ambulatory Visit: Payer: Self-pay | Admitting: Advanced Practice Midwife

## 2023-09-16 ENCOUNTER — Other Ambulatory Visit: Payer: Self-pay

## 2023-09-16 DIAGNOSIS — Z30011 Encounter for initial prescription of contraceptive pills: Secondary | ICD-10-CM

## 2023-09-16 MED ORDER — DROSPIRENONE-ETHINYL ESTRADIOL 3-0.02 MG PO TABS
1.0000 | ORAL_TABLET | Freq: Every day | ORAL | 0 refills | Status: DC
Start: 2023-09-16 — End: 2023-09-16

## 2023-09-16 MED ORDER — NORGESTIM-ETH ESTRAD TRIPHASIC 0.18/0.215/0.25 MG-25 MCG PO TABS
1.0000 | ORAL_TABLET | Freq: Every day | ORAL | 2 refills | Status: DC
Start: 2023-09-16 — End: 2023-12-02

## 2023-12-02 ENCOUNTER — Other Ambulatory Visit: Payer: Self-pay

## 2023-12-02 DIAGNOSIS — Z30011 Encounter for initial prescription of contraceptive pills: Secondary | ICD-10-CM

## 2023-12-02 MED ORDER — NORGESTIM-ETH ESTRAD TRIPHASIC 0.18/0.215/0.25 MG-25 MCG PO TABS: 1.0000 | ORAL_TABLET | Freq: Every day | ORAL | 2 refills | Status: DC

## 2024-02-23 ENCOUNTER — Other Ambulatory Visit: Payer: Self-pay

## 2024-02-23 DIAGNOSIS — Z30011 Encounter for initial prescription of contraceptive pills: Secondary | ICD-10-CM

## 2024-02-23 MED ORDER — NORGESTIM-ETH ESTRAD TRIPHASIC 0.18/0.215/0.25 MG-25 MCG PO TABS: 1.0000 | ORAL_TABLET | Freq: Every day | ORAL | 2 refills | Status: DC

## 2024-03-18 ENCOUNTER — Ambulatory Visit: Payer: Self-pay | Admitting: Obstetrics and Gynecology

## 2024-03-18 ENCOUNTER — Other Ambulatory Visit (HOSPITAL_COMMUNITY)
Admission: RE | Admit: 2024-03-18 | Discharge: 2024-03-18 | Disposition: A | Source: Ambulatory Visit | Attending: Obstetrics and Gynecology | Admitting: Obstetrics and Gynecology

## 2024-03-18 ENCOUNTER — Encounter: Payer: Self-pay | Admitting: Obstetrics and Gynecology

## 2024-03-18 VITALS — BP 122/79 | HR 94 | Ht 63.0 in | Wt 133.4 lb

## 2024-03-18 DIAGNOSIS — Z3009 Encounter for other general counseling and advice on contraception: Secondary | ICD-10-CM

## 2024-03-18 DIAGNOSIS — N898 Other specified noninflammatory disorders of vagina: Secondary | ICD-10-CM | POA: Diagnosis not present

## 2024-03-18 DIAGNOSIS — Z30011 Encounter for initial prescription of contraceptive pills: Secondary | ICD-10-CM

## 2024-03-18 DIAGNOSIS — Z638 Other specified problems related to primary support group: Secondary | ICD-10-CM

## 2024-03-18 DIAGNOSIS — Z01419 Encounter for gynecological examination (general) (routine) without abnormal findings: Secondary | ICD-10-CM

## 2024-03-18 DIAGNOSIS — Z124 Encounter for screening for malignant neoplasm of cervix: Secondary | ICD-10-CM | POA: Diagnosis present

## 2024-03-18 DIAGNOSIS — Z1239 Encounter for other screening for malignant neoplasm of breast: Secondary | ICD-10-CM

## 2024-03-18 DIAGNOSIS — L659 Nonscarring hair loss, unspecified: Secondary | ICD-10-CM

## 2024-03-18 MED ORDER — NORGESTIM-ETH ESTRAD TRIPHASIC 0.18/0.215/0.25 MG-25 MCG PO TABS: 1.0000 | ORAL_TABLET | Freq: Every day | ORAL | 11 refills | Status: AC

## 2024-03-18 NOTE — Progress Notes (Signed)
 Pt scored 9 on PHQ and 16 on GAD. Pt states she already sees a therapist.   Here for annual for birth control pill RX renewal.  Declines PAP and STI/STD testing today.

## 2024-03-18 NOTE — Progress Notes (Signed)
 "   GYNECOLOGY ANNUAL PREVENTATIVE CARE ENCOUNTER NOTE  Subjective:   Katherine Barber is a 39 y.o. [phone] female here for a annual gynecologic exam. Current complaints: stressed about life situation.  Works full time, has two kids at home. Fiance lives away most of the week so she has two kids by herself, then when he is there, they have his two kids as well. She does not get help with her kids or house from fiance. Feels herself getting aggravated and short with her kids, she does not want to be short with them. Has tried multiple medications but none are helping.   Reports occasional vaginal smell, intermittently, will occur spontaneously and resolve.   Denies abnormal vaginal bleeding, discharge, pelvic pain, problems with intercourse or other gynecologic concerns. Declines STI screen.   Gynecologic History Patient's last menstrual period was 02/27/2024 (exact date). Contraception: OCP (estrogen/progesterone) Last Pap: 02/2022. Results: normal Last mammogram: n/a Gardisil:    Obstetric History OB History  Gravida Para Term Preterm AB Living  4 2 1 1 1 2   SAB IAB Ectopic Multiple Live Births  1 0 0 0 2    # Outcome Date GA Lbr Len/2nd Weight Sex Type Anes PTL Lv  4 Term 05/06/18 [redacted]w[redacted]d 12:05 / 00:17 6 lb 2.9 oz (2.804 kg) M Vag-Spont EPI  LIV  3 Preterm           2 Gravida           1 SAB             Past Medical History:  Diagnosis Date   Anxiety    Biceps tendonitis, right 11/22/2019   Formatting of this note might be different from the original. 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   Pregnancy induced hypertension    UTI (urinary tract infection)     Past Surgical History:  Procedure Laterality Date   KNEE ARTHROSCOPY  Bilateral    2 on left, 1 on right   NOSE SURGERY     PALATAL EXPANSION     SHOULDER SURGERY Right 12/01/2019   SHOULDER SURGERY Left 08/2023   WISDOM TOOTH EXTRACTION     WRIST SURGERY      Medications Ordered Prior to Encounter[1]  Allergies[2]  Social History   Socioeconomic History   Marital status: Significant Other    Spouse name: Not on file   Number of children: Not on file   Years of education: Not on file   Highest education level: Not on file  Occupational History   Not on file  Tobacco Use   Smoking status: Every Day    Current packs/day: 0.50    Types: Cigarettes   Smokeless tobacco: Never  Vaping Use   Vaping status: Never Used  Substance and Sexual Activity   Alcohol use: Not Currently    Comment: socially- not since pregnancy   Drug use: No   Sexual activity: Yes    Partners: Male    Birth control/protection: Pill  Other Topics Concern   Not on file  Social History Narrative   Not on file   Social Drivers of Health   Tobacco Use: High Risk (03/18/2024)   Patient History    Smoking Tobacco Use: Every Day  Smokeless Tobacco Use: Never    Passive Exposure: Not on file  Financial Resource Strain: Low Risk (06/27/2023)   Received from [facility]   Overall Financial Resource Strain (CARDIA)    Difficulty of Paying Living Expenses: Not hard at all  Food Insecurity: No Food Insecurity (06/27/2023)   Received from [facility]   Epic    Within the past 12 months, you worried that your food would run out before you got the money to buy more.: Never true    Within the past 12 months, the food you bought just didn't last and you didn't have money to get more.: Never true  Transportation Needs: No Transportation Needs (06/27/2023)   Received from [facility] - Transportation    Lack of Transportation (Medical): No    Lack of Transportation (Non-Medical): No  Physical Activity: Not on file  Stress: No Stress Concern Present (09/29/2023)    Received from [facility] - Occupational Stress Questionnaire    Do you feel stress - tense, restless, nervous, or anxious, or unable to sleep at night because your mind is troubled all the time - these days?: Not at all  Social Connections: Unknown (08/23/2022)   Received from [facility]   Social Connection and Isolation Panel    In a typical week, how many times do you talk on the phone with family, friends, or neighbors?: More than three times a week    How often do you get together with friends or relatives?: More than three times a week    Attends Religious Services: Not on file    Active Member of Clubs or Organizations: Not on file    Attends Club or Organization Meetings: Not on file    Marital Status: Not on file  Intimate Partner Violence: Not At Risk (09/29/2023)   Received from [facility]   HITS    Over the last 12 months how often did your partner physically hurt you?: Never    Over the last 12 months how often did your partner insult you or talk down to you?: Never    Over the last 12 months how often did your partner threaten you with physical harm?: Never    Over the last 12 months how often did your partner scream or curse at you?: Never  Depression (PHQ2-9): Low Risk (06/12/2022)   Depression (PHQ2-9)    PHQ-2 Score: 3  Alcohol Screen: Not on file  Housing: Low Risk (02/14/2024)   Received from [facility]   Epic    Think about the place you live. Do you have problems with any of the following? Choose all that apply:: None/None on this list    What is your living situation today?: I have a steady place to live  Utilities: Not At Risk (06/27/2023)   Received from [facility]ities    Threatened with loss of utilities: No  Health Literacy: Not on file    Family History  Problem Relation Age of Onset   Cancer Paternal Grandfather    Congestive Heart Failure Father      The following portions of  the patient's history were reviewed and updated as appropriate: allergies, current medications, past family history, past medical history, past social history, past surgical history and problem list.  Review of Systems Pertinent items are noted in HPI.   Objective:  BP 122/79   Pulse 94   Ht 5' 3 (  1.6 m)   Wt 133 lb 6.4 oz (60.5 kg)   LMP 02/27/2024 (Exact Date)   Breastfeeding No   BMI 23.63 kg/m  CONSTITUTIONAL: Well-developed, well-nourished female in no acute distress.  HENT:  Normocephalic, atraumatic, External right and left ear normal. Oropharynx is clear and moist EYES: Conjunctivae and EOM are normal. Pupils are equal, round, and reactive to light. No scleral icterus.  NECK: Normal range of motion, supple, no masses.  Normal thyroid.  SKIN: Skin is warm and dry. No rash noted. Not diaphoretic. No erythema. No pallor. NEUROLOGIC: Alert and oriented to person, place, and time. Normal reflexes, muscle tone coordination. No cranial nerve deficit noted. PSYCHIATRIC: Normal mood and affect. Normal behavior. Normal judgment and thought content. CARDIOVASCULAR: Normal heart rate noted RESPIRATORY: Effort normal, no problems with respiration noted. BREASTS: Symmetric in size. No masses, skin changes, nipple drainage, or lymphadenopathy. ABDOMEN: Soft, no distention noted.  No tenderness, rebound or guarding.  PELVIC: Normal appearing external genitalia; piercing at clitoral hood, well healed, normal appearing vaginal mucosa and cervix.  No abnormal discharge noted.  Pelvic cultures obtained. Normal uterine size, no other palpable masses, no uterine or adnexal tenderness. MUSCULOSKELETAL: Normal range of motion. No tenderness.  No cyanosis, clubbing, or edema.   Exam done with chaperone present.  Assessment and Plan:  1. Initiation of oral contraception - Norgestim-Eth Estrad Triphasic (NORGESTIMATE-ETHINYL ESTRADIOL  TRIPHASIC) 0.18/0.215/0.25 MG-25 MCG tab; Take 1 tablet by mouth  daily.  Dispense: 28 tablet; Refill: 11  2. Well woman exam (Primary) Normal female exam  3. Cervical cancer screening - Cytology - PAP( [place])  4. Encounter for breast cancer screening using non-mammogram modality No lesions noted  5. Vaginal odor Swab today  6. Encounter for counseling regarding contraception Happy with OCPs Refil sent  7. Stress due to family tension Recommended BH for resources outside of current counselor, she is agreeable Reviewed that medication may not be solution, when situational stressors are not relieved Denies SI/HI - Amb ref to [facility]  8. Hair loss - TSH Rfx on Abnormal to Free T4   Will follow up results of pap smear and manage accordingly. Encouraged improvement in diet and exercise.    Routine preventative health maintenance measures emphasized. Please refer to After Visit Summary for other counseling recommendations.     LOIS Yolanda Moats, MD, [facility] Attending Center for [facility])     [1]  Current Outpatient Medications on File Prior to Visit  Medication Sig Dispense Refill   lisdexamfetamine (VYVANSE) 40 MG capsule Take 40 mg by mouth every morning.     Vilazodone HCl (VIIBRYD) 40 MG TABS Take 40 mg by mouth daily.     No current facility-administered medications on file prior to visit.  [2] No Known Allergies  "

## 2024-03-19 LAB — CERVICOVAGINAL ANCILLARY ONLY
Bacterial Vaginitis (gardnerella): NEGATIVE
Candida Glabrata: NEGATIVE
Candida Vaginitis: NEGATIVE
Comment: NEGATIVE
Comment: NEGATIVE
Comment: NEGATIVE

## 2024-03-19 LAB — CYTOLOGY - PAP
Comment: NEGATIVE
Diagnosis: NEGATIVE
High risk HPV: NEGATIVE

## 2024-03-19 LAB — TSH RFX ON ABNORMAL TO FREE T4: TSH: 1.71 u[IU]/mL (ref 0.450–4.500)

## 2024-03-22 ENCOUNTER — Ambulatory Visit: Payer: Self-pay | Admitting: Obstetrics and Gynecology

## 2024-03-25 ENCOUNTER — Ambulatory Visit: Payer: Self-pay | Admitting: Licensed Clinical Social Worker

## 2024-03-25 DIAGNOSIS — F4323 Adjustment disorder with mixed anxiety and depressed mood: Secondary | ICD-10-CM
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
## 2024-04-05 ENCOUNTER — Encounter: Payer: Self-pay | Admitting: Licensed Clinical Social Worker

## 2024-04-12 ENCOUNTER — Encounter: Admitting: Licensed Clinical Social Worker
